# Patient Record
Sex: Female | Born: 1965 | Race: Black or African American | Hispanic: No | Marital: Single | Smoking: Never smoker
Health system: Southern US, Community
[De-identification: ages and names within clinical notes are randomized; demographics above are authoritative.]

## PROBLEM LIST (undated history)

## (undated) DIAGNOSIS — Q02 Microcephaly: Secondary | ICD-10-CM

## (undated) DIAGNOSIS — F79 Unspecified intellectual disabilities: Secondary | ICD-10-CM

## (undated) DIAGNOSIS — I10 Essential (primary) hypertension: Secondary | ICD-10-CM

## (undated) DIAGNOSIS — F419 Anxiety disorder, unspecified: Secondary | ICD-10-CM

## (undated) DIAGNOSIS — IMO0002 Reserved for concepts with insufficient information to code with codable children: Secondary | ICD-10-CM

## (undated) DIAGNOSIS — R569 Unspecified convulsions: Secondary | ICD-10-CM

## (undated) DIAGNOSIS — Z8659 Personal history of other mental and behavioral disorders: Secondary | ICD-10-CM

## (undated) HISTORY — DX: Essential (primary) hypertension: I10

## (undated) HISTORY — DX: Unspecified intellectual disabilities: F79

## (undated) HISTORY — DX: Reserved for concepts with insufficient information to code with codable children: IMO0002

---

## 1999-01-05 ENCOUNTER — Other Ambulatory Visit: Admission: RE | Admit: 1999-01-05 | Discharge: 1999-01-05 | Payer: Self-pay | Admitting: Obstetrics

## 1999-01-06 ENCOUNTER — Other Ambulatory Visit: Admission: RE | Admit: 1999-01-06 | Discharge: 1999-01-06 | Payer: Self-pay | Admitting: Obstetrics

## 1999-01-06 ENCOUNTER — Encounter (INDEPENDENT_AMBULATORY_CARE_PROVIDER_SITE_OTHER): Payer: Self-pay

## 2000-05-19 ENCOUNTER — Emergency Department (HOSPITAL_COMMUNITY): Admission: EM | Admit: 2000-05-19 | Discharge: 2000-05-19 | Payer: Self-pay | Admitting: Emergency Medicine

## 2005-12-11 ENCOUNTER — Ambulatory Visit: Payer: Self-pay | Admitting: Obstetrics & Gynecology

## 2005-12-11 ENCOUNTER — Other Ambulatory Visit: Admission: RE | Admit: 2005-12-11 | Discharge: 2005-12-11 | Payer: Self-pay | Admitting: Obstetrics & Gynecology

## 2005-12-11 ENCOUNTER — Encounter (INDEPENDENT_AMBULATORY_CARE_PROVIDER_SITE_OTHER): Payer: Self-pay | Admitting: Specialist

## 2006-01-02 ENCOUNTER — Encounter: Payer: Self-pay | Admitting: Obstetrics and Gynecology

## 2006-01-02 ENCOUNTER — Ambulatory Visit: Payer: Self-pay | Admitting: Obstetrics and Gynecology

## 2006-01-10 ENCOUNTER — Ambulatory Visit (HOSPITAL_COMMUNITY): Admission: RE | Admit: 2006-01-10 | Discharge: 2006-01-10 | Payer: Self-pay | Admitting: Obstetrics & Gynecology

## 2006-08-29 ENCOUNTER — Encounter: Payer: Self-pay | Admitting: Obstetrics and Gynecology

## 2006-08-29 ENCOUNTER — Other Ambulatory Visit: Admission: RE | Admit: 2006-08-29 | Discharge: 2006-08-29 | Payer: Self-pay | Admitting: Obstetrics & Gynecology

## 2006-08-29 ENCOUNTER — Ambulatory Visit: Payer: Self-pay | Admitting: Obstetrics and Gynecology

## 2006-08-29 ENCOUNTER — Encounter (INDEPENDENT_AMBULATORY_CARE_PROVIDER_SITE_OTHER): Payer: Self-pay | Admitting: *Deleted

## 2007-01-27 ENCOUNTER — Ambulatory Visit (HOSPITAL_COMMUNITY): Admission: RE | Admit: 2007-01-27 | Discharge: 2007-01-27 | Payer: Self-pay | Admitting: Obstetrics & Gynecology

## 2007-04-23 HISTORY — PX: LEEP: SHX91

## 2007-04-23 HISTORY — PX: CERVICAL CONE BIOPSY: SUR198

## 2007-05-21 ENCOUNTER — Ambulatory Visit: Payer: Self-pay | Admitting: Obstetrics and Gynecology

## 2007-06-09 ENCOUNTER — Ambulatory Visit (HOSPITAL_COMMUNITY): Admission: RE | Admit: 2007-06-09 | Discharge: 2007-06-09 | Payer: Self-pay | Admitting: Obstetrics and Gynecology

## 2007-06-09 ENCOUNTER — Ambulatory Visit: Payer: Self-pay | Admitting: Obstetrics and Gynecology

## 2007-06-09 ENCOUNTER — Encounter: Payer: Self-pay | Admitting: Obstetrics and Gynecology

## 2007-07-01 ENCOUNTER — Ambulatory Visit: Payer: Self-pay | Admitting: Obstetrics and Gynecology

## 2008-03-03 ENCOUNTER — Ambulatory Visit (HOSPITAL_COMMUNITY): Admission: RE | Admit: 2008-03-03 | Discharge: 2008-03-03 | Payer: Self-pay | Admitting: Obstetrics & Gynecology

## 2008-11-11 ENCOUNTER — Encounter (INDEPENDENT_AMBULATORY_CARE_PROVIDER_SITE_OTHER): Payer: Self-pay | Admitting: Obstetrics & Gynecology

## 2008-11-11 ENCOUNTER — Ambulatory Visit: Payer: Self-pay | Admitting: Obstetrics & Gynecology

## 2009-04-25 ENCOUNTER — Encounter: Admission: RE | Admit: 2009-04-25 | Discharge: 2009-04-25 | Payer: Self-pay | Admitting: Obstetrics and Gynecology

## 2010-07-02 ENCOUNTER — Other Ambulatory Visit: Payer: Self-pay | Admitting: Internal Medicine

## 2010-07-02 DIAGNOSIS — Z1231 Encounter for screening mammogram for malignant neoplasm of breast: Secondary | ICD-10-CM

## 2010-07-06 ENCOUNTER — Ambulatory Visit
Admission: RE | Admit: 2010-07-06 | Discharge: 2010-07-06 | Disposition: A | Discharge status: Home / Self Care | Payer: Medicaid Other | Source: Ambulatory Visit | Attending: Internal Medicine | Admitting: Internal Medicine

## 2010-07-06 DIAGNOSIS — Z1231 Encounter for screening mammogram for malignant neoplasm of breast: Secondary | ICD-10-CM

## 2010-07-29 LAB — POCT PREGNANCY, URINE: Preg Test, Ur: NEGATIVE

## 2010-09-04 NOTE — Group Therapy Note (Signed)
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  The patient was in a dorsolithotomy  position, the Graves speculum placed in the vagina, the cervix difficult  to get in position because of the acute retroversion of the cervix.  The  cervical os was stenotic and could not be entered with a curette;  however a Pap smear brush I put in there for an endocervical ECC.  The  3% acetic acid was used on the exocervix which showed no signs of any  acetowhite changes or punctations, mosaicism or atypical vessels.  Because I could not see any lesion or could not see inside of the  cervical os I have to call this an unsatisfactory colposcopy pending the  results of the ECC.  It is difficult for the guardian to bring in the  patient so we will call with results.           ______________________________  Argentina Donovan, MD     PR/MEDQ  D:  08/29/2006  T:  08/29/2006  Job:  [id number]

## 2010-09-04 NOTE — Op Note (Signed)
NAMEJORDEN, Brandy Galloway               ACCOUNT NO.:  [id number]   MEDICAL RECORD NO.:  [id number]          PATIENT TYPE:  AMB   LOCATION:  [facility]                           [facility]   PHYSICIAN:  Phil D. Okey Dupre, M.D.     DATE OF BIRTH:  March 12, 1966   DATE OF PROCEDURE:  06/09/2007  DATE OF DISCHARGE:                               OPERATIVE REPORT   PROCEDURE:  LEEP conization of the cervix.   PREOPERATIVE DIAGNOSIS:  Cervical dysplasia.   POSTOPERATIVE DIAGNOSIS:  Pending pathology report.   SURGEON:  Javier Glazier. Okey Dupre, M.D.   ANESTHESIA:  General.   ESTIMATED BLOOD LOSS:  Minimal.   POSTOPERATIVE CONDITION:  Satisfactory.   SPECIMENS TO PATHOLOGY:  Cervical LEEP conization.   DESCRIPTION OF PROCEDURE:  Under satisfactory general anesthesia, the  patient in dorsal lithotomy position, the perineum and vagina were  prepped and draped in the usual sterile manner.  Bimanual pelvic  examination under anesthesia revealed uterus of normal size, shape,  consistency, anterior flexed, freely movable, normal free adnexa.  A  insulated Graves speculum placed into the vagina with the cervix in the  midportion easily visualized lateral.  Insulated retractors were placed  in the sidewalls of the vagina to protect against burn and shock.  Then  1 mL of 1% Xylocaine with 1:100,000 epinephrine was injected in four  separate areas at 2, 10, 4 and 8 o'clock on the anterior surface of the  cervix and then using a 1.5 x 1.5 electrical loop at 50 cutting, 50 coag  with a 50 cutting with a one blend, the cone was taken at the basis and  was then sent for pathological diagnosis.  The base was coagulated with  a ball electrode for hemostasis and also around the entire circumference  of the biopsy site, outside the biopsy site in case any abnormal tissue  was missed.  There was virtually no bleeding during the procedure.  The  speculums were removed and the patient transferred to recovery room in  satisfactory  condition.      Phil D. Okey Dupre, M.D.  Electronically Signed     PDR/MEDQ  D:  06/09/2007  T:  06/10/2007  Job:  [id number]

## 2010-09-04 NOTE — Group Therapy Note (Signed)
NAME:  Brandy Galloway, Brandy Galloway NO.:  [id number]   MEDICAL RECORD NO.:  [id number]          PATIENT TYPE:  WOC   LOCATION:  [facility]                   [facility]ACILITY[facility]  [facility]   PHYSICIAN:  Argentina Donovan, MD        DATE OF BIRTH:  07-31-1965   DATE OF SERVICE:  05/21/2007                                  CLINIC NOTE   The patient is a 45 year old mentally challenged, African American,  nulligravida female, who has had several colposcopies in for atypical  cells and cervical dysplasia who was referred by the Health Department  needing a LEEP biopsy which we are going to carry out in the operating  room because of the patient's mental capacities. I think it will be  easier on her and on [place] to do it that way. She is in good health.   She takes some hydrochlorothiazide for hypertension.   She has no medical allergies.   No serious medical illnesses.   IMPRESSION:  Severe cervical dysplasia for LEEP biopsy.           ______________________________  Argentina Donovan, MD     PR/MEDQ  D:  05/21/2007  T:  05/21/2007  Job:  [id number]

## 2010-09-07 NOTE — Group Therapy Note (Signed)
Brandy Galloway, Brandy Galloway NO.:  [id number]   MEDICAL RECORD NO.:  [id number]          PATIENT TYPE:  WOC   LOCATION[facility]  [facility]                   [facility]ILITY[facility]   PHYSICIAN:  Elsie Lincoln, MD      DATE OF BIRTH:  04-21-1966   DATE OF SERVICE:                                    CLINIC NOTE   DATE OF SERVICE:  December 11, 2005.   REASON FOR VISIT:  The patient is a 45 year old, nulliparous female sent in  consultation by the health department  The patient is a nulliparous female  who is on Depo-Provera.  She is not having menstrual periods.  On her yearly  exam, she was found to have a intracervical polyp extruding from her cervix.  She was sent here for consultation for that.  She does have questionable  history of abnormal Pap smear, but this is not in her records, so I will  need the health department, once they read this dictation, to please send  copies of her Pap smears to the [facility] [place].  She also had  a diagnosis of cervicitis and Zithromax was given.  They did gonorrhea and  Chlamydia cultures and again, I do not have copies of those.  It is unknown  whether she is sexually active or not.  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   PHYSICAL EXAMINATION:  GENITALIA:  10/5.  Vagina slightly atrophic.  Cervix  closed with 1 cm polyp extruding.  This was twisted off with minimal  bleeding.  The patient tolerated the procedure well even given her mental  retardation, uterine constrictions or inability to relax.   ASSESSMENT:  A 45 year old female with endocervical polyp.   PLAN:  1. The patient needs Pap smear or I need to have Pap smears sent over from      the Health Department.  2. The patient needs a screening mammogram.  3. Return to clinic in 3-4 weeks.           ______________________________  Elsie Lincoln, MD     KL/MEDQ  D:  12/11/2005  T:  12/12/2005  Job:  [id number]

## 2011-01-11 LAB — CBC
HCT: 39.1
Hemoglobin: 13.6
MCHC: 34.9
MCV: 91.3
Platelets: 278
RBC: 4.28
RDW: 12.8
WBC: 5.5

## 2011-01-11 LAB — BASIC METABOLIC PANEL
BUN: 13
CO2: 27
Calcium: 9.8
Chloride: 97
Creatinine, Ser: 0.99
GFR calc Af Amer: >60
GFR calc non Af Amer: >60
Glucose, Bld: 109 — ABNORMAL HIGH
Potassium: 3.1 — ABNORMAL LOW
Sodium: 134 — ABNORMAL LOW

## 2011-01-11 LAB — URINALYSIS, ROUTINE W REFLEX MICROSCOPIC
Bilirubin Urine: NEGATIVE
Glucose, UA: NEGATIVE
Hgb urine dipstick: NEGATIVE
Ketones, ur: NEGATIVE
Nitrite: NEGATIVE
Protein, ur: NEGATIVE
Specific Gravity, Urine: 1.025
Urobilinogen, UA: 0.2
pH: 6

## 2011-01-11 LAB — HCG, SERUM, QUALITATIVE: Preg, Serum: NEGATIVE

## 2011-05-16 ENCOUNTER — Encounter: Payer: Medicaid Other | Admitting: Obstetrics and Gynecology

## 2011-05-16 ENCOUNTER — Encounter: Payer: Medicaid Other | Admitting: Physician Assistant

## 2011-06-21 HISTORY — PX: COLPOSCOPY: SHX161

## 2011-07-12 ENCOUNTER — Ambulatory Visit (INDEPENDENT_AMBULATORY_CARE_PROVIDER_SITE_OTHER): Payer: Medicaid Other | Admitting: Physician Assistant

## 2011-07-12 ENCOUNTER — Encounter: Payer: Self-pay | Admitting: Physician Assistant

## 2011-07-12 ENCOUNTER — Other Ambulatory Visit (HOSPITAL_COMMUNITY)
Admission: RE | Admit: 2011-07-12 | Discharge: 2011-07-12 | Disposition: A | Discharge status: Home / Self Care | Payer: Medicaid Other | Source: Ambulatory Visit | Attending: Obstetrics & Gynecology | Admitting: Obstetrics & Gynecology

## 2011-07-12 VITALS — BP 123/81 | HR 84 | Temp 97.1°F | Ht 65.5 in | Wt 101.3 lb

## 2011-07-12 DIAGNOSIS — IMO0002 Reserved for concepts with insufficient information to code with codable children: Secondary | ICD-10-CM

## 2011-07-12 DIAGNOSIS — Z01812 Encounter for preprocedural laboratory examination: Secondary | ICD-10-CM

## 2011-07-12 DIAGNOSIS — R87619 Unspecified abnormal cytological findings in specimens from cervix uteri: Secondary | ICD-10-CM | POA: Insufficient documentation

## 2011-07-12 DIAGNOSIS — R87612 Low grade squamous intraepithelial lesion on cytologic smear of cervix (LGSIL): Secondary | ICD-10-CM

## 2011-07-12 HISTORY — DX: Low grade squamous intraepithelial lesion on cytologic smear of cervix (LGSIL): R87.612

## 2011-07-12 HISTORY — DX: Reserved for concepts with insufficient information to code with codable children: IMO0002

## 2011-07-12 LAB — POCT PREGNANCY, URINE: Preg Test, Ur: NEGATIVE

## 2011-07-12 NOTE — Progress Notes (Signed)
Patient s/p LEEP in 2009. Hx recurrent abdnormals since LEEP. Last pap 02/2011: LSIL. Pt mentally challenged, consent given by legal guardian, signed copy in the chart, time out was performed.  Placed in lithotomy position. Cervix viewed with speculum and colposcope after application of acetic acid. Pt with previous LEEP/Conization in 2009. Cervix difficult to visualize. SCJ not apparent. Cervix speculum used.   Colposcopy adequate?  No Acetowhite lesions?Yes Punctation?No Mosaicism?  No Abnormal vasculature?  No Biopsies?Yes at 6 o'clock ECC?yes  COMMENTS:Pt tolerated the procedure well without sedation. If unclear pathology, would recommend LEEP in OR Patient was given post procedure instructions.  She will return in 2 weeks for results.

## 2011-07-12 NOTE — Patient Instructions (Signed)
Colposcopy Care After Colposcopy is a procedure in which a special tool is used to magnify the surface of the cervix. A tissue sample (biopsy) may also be taken. This sample will be looked at for cervical cancer or other problems. After the test:  You may have some cramping.   Lie down for a few minutes if you feel lightheaded.    You may have some bleeding which should stop in a few days.  HOME CARE  Do not have sex or use tampons for 2 to 3 days or as told.   Only take medicine as told by your doctor.   Continue to take your birth control pills as usual.  Finding out the results of your test Ask when your test results will be ready. Make sure you get your test results. GET HELP RIGHT AWAY IF:  You are bleeding a lot or are passing blood clots.   You develop a fever of 102 F (38.9 C) or higher.   You have abnormal vaginal discharge.   You have cramps that do not go away with medicine.   You feel lightheaded, dizzy, or pass out (faint).  MAKE SURE YOU:   Understand these instructions.   Will watch your condition.   Will get help right away if you are not doing well or get worse.  Document Released: 09/25/2007 Document Revised: 03/28/2011 Document Reviewed: 09/25/2007 ExitCare Patient Information 2012 [employer]. 

## 2011-07-16 ENCOUNTER — Other Ambulatory Visit: Payer: Self-pay | Admitting: Internal Medicine

## 2011-07-16 DIAGNOSIS — Z1231 Encounter for screening mammogram for malignant neoplasm of breast: Secondary | ICD-10-CM

## 2011-08-07 ENCOUNTER — Ambulatory Visit: Payer: Medicaid Other

## 2011-08-09 ENCOUNTER — Ambulatory Visit (INDEPENDENT_AMBULATORY_CARE_PROVIDER_SITE_OTHER): Payer: Medicaid Other | Admitting: Obstetrics & Gynecology

## 2011-08-09 ENCOUNTER — Encounter: Payer: Self-pay | Admitting: Obstetrics & Gynecology

## 2011-08-09 VITALS — BP 153/92 | HR 87 | Temp 97.5°F | Ht 65.0 in | Wt 101.2 lb

## 2011-08-09 DIAGNOSIS — F79 Unspecified intellectual disabilities: Secondary | ICD-10-CM

## 2011-08-09 DIAGNOSIS — R634 Abnormal weight loss: Secondary | ICD-10-CM

## 2011-08-09 DIAGNOSIS — R87612 Low grade squamous intraepithelial lesion on cytologic smear of cervix (LGSIL): Secondary | ICD-10-CM

## 2011-08-09 NOTE — Progress Notes (Signed)
  Subjective:    Patient ID: Brandy Galloway, female    DOB: 09-Apr-1966, 46 y.o.   MRN: [id number]  HPI  Brandy Galloway is a 46 MR lady who has a h/o abnormal paps. 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 Her colposcopy was inadequate. She complains of an unintentional 47 # weight loss over the last year, per her aunt/guardian.  Review of Systems    She is not currently sexually active and is on depo provera for menstrual regulation. Objective:   Physical Exam        Assessment & Plan:  Inadequate colpo with recurrent LGSIL- plan for CKC Weight loss- TSH

## 2011-08-10 LAB — TSH: TSH: 1.307 u[IU]/mL (ref 0.350–4.500)

## 2011-08-15 ENCOUNTER — Ambulatory Visit
Admission: RE | Admit: 2011-08-15 | Discharge: 2011-08-15 | Disposition: A | Discharge status: Home / Self Care | Payer: Medicaid Other | Source: Ambulatory Visit | Attending: Internal Medicine | Admitting: Internal Medicine

## 2011-08-15 DIAGNOSIS — Z1231 Encounter for screening mammogram for malignant neoplasm of breast: Secondary | ICD-10-CM

## 2011-08-16 ENCOUNTER — Encounter (HOSPITAL_COMMUNITY): Payer: Self-pay

## 2011-08-16 ENCOUNTER — Encounter (HOSPITAL_COMMUNITY): Payer: Self-pay | Admitting: Pharmacist

## 2011-08-16 ENCOUNTER — Inpatient Hospital Stay (HOSPITAL_COMMUNITY): Admission: RE | Admit: 2011-08-16 | Discharge: 2011-08-16 | Payer: Medicaid Other | Source: Ambulatory Visit

## 2011-08-16 NOTE — Patient Instructions (Signed)
   Your procedure is scheduled on: Tuesday April 30th  Enter through the Main Entrance of [facility] at: 10am Pick up the phone at the desk and dial [phone] and inform us of your arrival.  Please call this number if you have any problems the morning of surgery: [phone]  Remember: Do not eat food after midnight: Monday Do not drink clear liquids after: midnight Monday Take these medicines the morning of surgery with a SIP OF WATER:  Do not wear jewelry, make-up, or FINGER nail polish Do not wear lotions, powders, perfumes or deodorant. Do not shave 48 hours prior to surgery. Do not bring valuables to the hospital. Contacts, dentures or bridgework may not be worn into surgery.  Leave suitcase in the car. After Surgery it may be brought to your room. For patients being admitted to the hospital, checkout time is 11:00am the day of discharge.  Patients discharged on the day of surgery will not be allowed to drive home.     Remember to use your hibiclens as instructed.Please shower with 1/2 bottle the evening before your surgery and the other 1/2 bottle the morning of surgery. Neck down avoiding private area.

## 2011-08-19 ENCOUNTER — Encounter (HOSPITAL_COMMUNITY)
Admission: RE | Admit: 2011-08-19 | Discharge: 2011-08-19 | Disposition: A | Discharge status: Home / Self Care | Payer: Medicaid Other | Source: Ambulatory Visit | Attending: Obstetrics & Gynecology | Admitting: Obstetrics & Gynecology

## 2011-08-19 ENCOUNTER — Encounter (HOSPITAL_COMMUNITY): Payer: Self-pay

## 2011-08-19 LAB — BASIC METABOLIC PANEL
BUN: 13 mg/dL (ref 6–23)
CO2: 28 mEq/L (ref 19–32)
Calcium: 10.3 mg/dL (ref 8.4–10.5)
Chloride: 97 mEq/L (ref 96–112)
Creatinine, Ser: 0.94 mg/dL (ref 0.50–1.10)
GFR calc Af Amer: 83 mL/min — ABNORMAL LOW (ref >90)
GFR calc non Af Amer: 72 mL/min — ABNORMAL LOW (ref >90)
Glucose, Bld: 65 mg/dL — ABNORMAL LOW (ref 70–99)
Potassium: 3.2 mEq/L — ABNORMAL LOW (ref 3.5–5.1)
Sodium: 138 mEq/L (ref 135–145)

## 2011-08-19 LAB — CBC
HCT: 39 % (ref 36.0–46.0)
Hemoglobin: 13 g/dL (ref 12.0–15.0)
MCH: 30.7 pg (ref 26.0–34.0)
MCHC: 33.3 g/dL (ref 30.0–36.0)
MCV: 92.2 fL (ref 78.0–100.0)
Platelets: 330 10*3/uL (ref 150–400)
RBC: 4.23 MIL/uL (ref 3.87–5.11)
RDW: 13 % (ref 11.5–15.5)
WBC: 7.5 10*3/uL (ref 4.0–10.5)

## 2011-08-19 NOTE — Pre-Procedure Instructions (Signed)
EKG requested x 3 from PMD and not received. I spoke with Dr. Malen Gauze and if we don't receive EKG by DOS, he said he doesn't need for [place] to do an EKG on this pt. He is also aware of glucose result of 65.

## 2011-08-19 NOTE — Patient Instructions (Addendum)
YOUR PROCEDURE IS SCHEDULED ON:08/20/11  ENTER THROUGH THE MAIN ENTRANCE OF [facility] AT:10am  USE DESK PHONE AND DIAL [phone] TO INFORM us OF YOUR ARRIVAL  CALL [phone] IF YOU HAVE ANY QUESTIONS OR PROBLEMS PRIOR TO YOUR ARRIVAL.  REMEMBER: DO NOT EAT OR DRINK AFTER MIDNIGHT :tonight  SPECIAL INSTRUCTIONS:Shower with special soap    YOU MAY BRUSH YOUR TEETH THE MORNING OF SURGERY   TAKE THESE MEDICINES THE DAY OF SURGERY WITH SIP OF WATER:BP medicine  DO NOT WEAR JEWELRY, EYE MAKEUP, LIPSTICK OR DARK FINGERNAIL POLISH DO NOT WEAR LOTIONS  DO NOT SHAVE FOR 48 HOURS PRIOR TO SURGERY  YOU WILL NOT BE ALLOWED TO DRIVE YOURSELF HOME.  NAME OF DRIVER:Jessie

## 2011-08-20 ENCOUNTER — Encounter (HOSPITAL_COMMUNITY): Payer: Self-pay | Admitting: Anesthesiology

## 2011-08-20 ENCOUNTER — Ambulatory Visit (HOSPITAL_COMMUNITY): Payer: Medicaid Other | Admitting: Anesthesiology

## 2011-08-20 ENCOUNTER — Ambulatory Visit (HOSPITAL_COMMUNITY)
Admission: RE | Admit: 2011-08-20 | Discharge: 2011-08-20 | Disposition: A | Discharge status: Home / Self Care | Payer: Medicaid Other | Source: Ambulatory Visit | Attending: Obstetrics & Gynecology | Admitting: Obstetrics & Gynecology

## 2011-08-20 ENCOUNTER — Encounter (HOSPITAL_COMMUNITY)
Admission: RE | Disposition: A | Discharge status: Home / Self Care | Payer: Self-pay | Source: Ambulatory Visit | Attending: Obstetrics & Gynecology

## 2011-08-20 DIAGNOSIS — Z01818 Encounter for other preprocedural examination: Secondary | ICD-10-CM | POA: Insufficient documentation

## 2011-08-20 DIAGNOSIS — R87612 Low grade squamous intraepithelial lesion on cytologic smear of cervix (LGSIL): Secondary | ICD-10-CM | POA: Insufficient documentation

## 2011-08-20 DIAGNOSIS — Z01812 Encounter for preprocedural laboratory examination: Secondary | ICD-10-CM | POA: Insufficient documentation

## 2011-08-20 DIAGNOSIS — F79 Unspecified intellectual disabilities: Secondary | ICD-10-CM | POA: Insufficient documentation

## 2011-08-20 HISTORY — PX: CERVICAL CONIZATION W/BX: SHX1330

## 2011-08-20 LAB — PREGNANCY, URINE: Preg Test, Ur: NEGATIVE

## 2011-08-20 SURGERY — CONE BIOPSY, CERVIX
Anesthesia: General | Site: Vagina | Wound class: Clean Contaminated

## 2011-08-20 MED ORDER — LIDOCAINE HCL (CARDIAC) 20 MG/ML IV SOLN
INTRAVENOUS | Status: DC | PRN
  Administered 2011-08-20: 80 mg via INTRAVENOUS

## 2011-08-20 MED ORDER — LACTATED RINGERS IV SOLN
INTRAVENOUS | Status: DC
  Administered 2011-08-20 (×2): via INTRAVENOUS

## 2011-08-20 MED ORDER — IODINE STRONG (LUGOLS) 5 % PO SOLN
ORAL | Status: DC | PRN
  Administered 2011-08-20: 0.2 mL

## 2011-08-20 MED ORDER — ONDANSETRON HCL 4 MG/2ML IJ SOLN
INTRAMUSCULAR | Status: DC | PRN
  Administered 2011-08-20: 4 mg via INTRAVENOUS

## 2011-08-20 MED ORDER — IBUPROFEN 600 MG PO TABS: 600.0000 mg | ORAL_TABLET | Freq: Four times a day (QID) | ORAL | Status: AC | PRN

## 2011-08-20 MED ORDER — ONDANSETRON HCL 4 MG/2ML IJ SOLN
INTRAMUSCULAR | Status: AC
  Filled 2011-08-20: qty 2

## 2011-08-20 MED ORDER — MEPERIDINE HCL 25 MG/ML IJ SOLN: 6.2500 mg | INTRAMUSCULAR | Status: DC | PRN

## 2011-08-20 MED ORDER — METOCLOPRAMIDE HCL 5 MG/ML IJ SOLN: 10.0000 mg | Freq: Once | INTRAMUSCULAR | Status: DC | PRN

## 2011-08-20 MED ORDER — MIDAZOLAM HCL 2 MG/2ML IJ SOLN
INTRAMUSCULAR | Status: AC
  Filled 2011-08-20: qty 2

## 2011-08-20 MED ORDER — MIDAZOLAM HCL 5 MG/5ML IJ SOLN
INTRAMUSCULAR | Status: DC | PRN
  Administered 2011-08-20: 2 mg via INTRAVENOUS

## 2011-08-20 MED ORDER — BUPIVACAINE-EPINEPHRINE (PF) 0.5% -1:200000 IJ SOLN
INTRAMUSCULAR | Status: AC
  Filled 2011-08-20: qty 10

## 2011-08-20 MED ORDER — FENTANYL CITRATE 0.05 MG/ML IJ SOLN
INTRAMUSCULAR | Status: DC | PRN
  Administered 2011-08-20: 100 ug via INTRAVENOUS

## 2011-08-20 MED ORDER — LIDOCAINE HCL (CARDIAC) 20 MG/ML IV SOLN
INTRAVENOUS | Status: AC
  Filled 2011-08-20: qty 5

## 2011-08-20 MED ORDER — PROPOFOL 10 MG/ML IV EMUL
INTRAVENOUS | Status: DC | PRN
  Administered 2011-08-20: 200 mg via INTRAVENOUS

## 2011-08-20 MED ORDER — FENTANYL CITRATE 0.05 MG/ML IJ SOLN: 25.0000 ug | INTRAMUSCULAR | Status: DC | PRN

## 2011-08-20 MED ORDER — BUPIVACAINE HCL (PF) 0.5 % IJ SOLN
INTRAMUSCULAR | Status: AC
  Filled 2011-08-20: qty 30

## 2011-08-20 MED ORDER — FENTANYL CITRATE 0.05 MG/ML IJ SOLN
INTRAMUSCULAR | Status: AC
  Filled 2011-08-20: qty 5

## 2011-08-20 MED ORDER — ACETIC ACID 4% SOLUTION
Status: DC | PRN
  Administered 2011-08-20: 1 via TOPICAL

## 2011-08-20 MED ORDER — BUPIVACAINE HCL (PF) 0.5 % IJ SOLN
INTRAMUSCULAR | Status: DC | PRN
  Administered 2011-08-20: 10 mL

## 2011-08-20 MED ORDER — PROPOFOL 10 MG/ML IV EMUL
INTRAVENOUS | Status: AC
  Filled 2011-08-20: qty 20

## 2011-08-20 MED ORDER — DEXTROSE IN LACTATED RINGERS 5 % IV SOLN: INTRAVENOUS | Status: DC

## 2011-08-20 SURGICAL SUPPLY — 29 items
ATCH SMKEVC FLXB CAUT HNDSWH (FILTER) IMPLANT
BLADE SURG 11 STRL SS (BLADE) ×2 IMPLANT
CLOTH BEACON ORANGE TIMEOUT ST (SAFETY) ×2 IMPLANT
CONTAINER PREFILL 10% NBF 60ML (FORM) ×4 IMPLANT
COUNTER NEEDLE 1200 MAGNETIC (NEEDLE) ×2 IMPLANT
DILATOR CANAL MILEX (MISCELLANEOUS) IMPLANT
ELECT REM PT RETURN 9FT ADLT (ELECTROSURGICAL) ×2
ELECTRODE REM PT RTRN 9FT ADLT (ELECTROSURGICAL) ×1 IMPLANT
EVACUATOR SMOKE ACCUVAC VALLEY (FILTER) ×1
GLOVE BIO SURGEON STRL SZ 6.5 (GLOVE) ×4 IMPLANT
GLOVE BIOGEL PI IND STRL 6 (GLOVE) ×1 IMPLANT
GLOVE BIOGEL PI INDICATOR 6 (GLOVE) ×1
GOWN PREVENTION PLUS LG XLONG (DISPOSABLE) ×4 IMPLANT
NDL SPNL 18GX3.5 QUINCKE PK (NEEDLE) ×1 IMPLANT
NEEDLE SPNL 18GX3.5 QUINCKE PK (NEEDLE) ×2 IMPLANT
PACK VAGINAL MINOR WOMEN LF (CUSTOM PROCEDURE TRAY) ×2 IMPLANT
PENCIL BUTTON HOLSTER BLD 10FT (ELECTRODE) ×2 IMPLANT
SCOPETTES 8  STERILE (MISCELLANEOUS) ×1
SCOPETTES 8 STERILE (MISCELLANEOUS) ×1 IMPLANT
SPONGE SURGIFOAM ABS GEL 12-7 (HEMOSTASIS) IMPLANT
SUT CHROMIC 0 CT 1 (SUTURE) IMPLANT
SUT VIC AB 0 CT1 27 (SUTURE)
SUT VIC AB 0 CT1 27XBRD ANBCTR (SUTURE) IMPLANT
SUT VICRYL 0 UR6 27IN ABS (SUTURE) ×4 IMPLANT
SYR CONTROL 10ML LL (SYRINGE) ×2 IMPLANT
TOWEL OR 17X24 6PK STRL BLUE (TOWEL DISPOSABLE) ×4 IMPLANT
TUBING NON-CON 1/4 X 20 CONN (TUBING) ×3 IMPLANT
WATER STERILE IRR 1000ML POUR (IV SOLUTION) ×2 IMPLANT
YANKAUER SUCT BULB TIP NO VENT (SUCTIONS) ×2 IMPLANT

## 2011-08-20 NOTE — Transfer of Care (Signed)
Immediate Anesthesia Transfer of Care Note  Patient: Brandy Galloway  Procedure(s) Performed: Procedure(s) (LRB): CONIZATION CERVIX WITH BIOPSY (N/A)  Patient Location: PACU  Anesthesia Type: General  Level of Consciousness: awake and alert   Airway & Oxygen Therapy: Patient Spontanous Breathing and Patient connected to nasal cannula oxygen  Post-op Assessment: Report given to PACU RN and Post -op Vital signs reviewed and stable  Post vital signs: stable  Complications: No apparent anesthesia complications

## 2011-08-20 NOTE — H&P (Signed)
  H&P  46 yo S AA G0 with MR who had a LGSIL pap 2012. She had an inadequate colposcopy and is here today for a CKC. She missed 2 other pre op appts and her surgery has thus been delayed until now.   PMH- MR (Her aunt is her guardian and how power of attorney)           HTN  PSH- LEEP 2009   Meds- HCTZ            Colace  KNDA  SH- negative  FH- [place]  ROS- [place]   PE- [id number]  VSS, AF  Heart- rrr Lungs- CTAB\ Abd- benign  A/P. LGSIL and inadequate colposcopy. She will need a CKC. Consent signed by her guardian.

## 2011-08-20 NOTE — Op Note (Signed)
08/20/2011  11:52 AM  PATIENT:  Brandy Galloway  46 y.o. female  PRE-OPERATIVE DIAGNOSIS:  lgsil abnormal pap, inadequate colposcopy, mental retardation  POST-OPERATIVE DIAGNOSIS:  same  PROCEDURE:  Procedure(s) (LRB): CONIZATION CERVIX WITH BIOPSY (N/A)  SURGEON:  Surgeon(s) and Role:    * Allie Bossier, MD - Primary  PHYSICIAN ASSISTANT:   ASSISTANTS: none   ANESTHESIA:   general  EBL:  Total I/O In: 1000 [I.V.:1000] Out: 15 [Blood:15]  BLOOD ADMINISTERED:none  DRAINS: none   LOCAL MEDICATIONS USED:  MARCAINE     SPECIMEN:  Source of Specimen:  cone biopsy and ECC  DISPOSITION OF SPECIMEN:  PATHOLOGY  COUNTS:  YES  TOURNIQUET:  * No tourniquets in log *  DICTATION: .Dragon Dictation  PLAN OF CARE: Discharge to home after PACU  PATIENT DISPOSITION:  PACU - hemodynamically stable.   Delay start of Pharmacological VTE agent (>24hrs) due to surgical blood loss or risk of bleeding: not applicable   The risks, benefits, and alternatives of surgery were explained to her guardian. Consents were signed. All questions were answered. In the operating room, general anesthesia was applied without complication. She was placed in dorsal lithotomy position. Her vagina was prepped and draped in the usual sterile fashion. A bimanual exam revealed no adnexal masses and a small uterus. Her cervix was palpably very small, presumably from her previous LEEP procedure. A Deaver was placed anteriorly and posteriorly in order to visualize her cervix. Lugol solution was placed on her cervix. There was a nonstaining lesion at the 6:00 position. Her cervix was almost flush with the vagina. I was able to grab a small lip of the anterior lip of the cervix. I used a scalpel to remove a cone-shaped portion of tissue. I doubt as much endocervical tissue was as I possibly could, but it seemed to be a very small amount. I cauterized the cone bed with the Bovie. I closed the cervix, orbital was left of  the cervix, with a 0 Vicryl suture in a purses string fashion. Excellent hemostasis was noted. She was taken to recovery in stable condition.

## 2011-08-20 NOTE — Discharge Instructions (Signed)

## 2011-08-20 NOTE — Anesthesia Preprocedure Evaluation (Signed)
Anesthesia Evaluation  Patient identified by MRN, date of birth, ID band Patient awake    Reviewed: Allergy & Precautions, H&P , NPO status , Patient's Chart, lab work & pertinent test results  Airway Mallampati: IV TM Distance: >3 FB Neck ROM: full    Dental  (+) Edentulous Upper   Pulmonary neg pulmonary ROS,  breath sounds clear to auscultation  Pulmonary exam normal       Cardiovascular hypertension, On Medications Rhythm:regular Rate:Normal     Neuro/Psych PSYCHIATRIC DISORDERS Mental Retardationnegative neurological ROS     GI/Hepatic negative GI ROS, Neg liver ROS,   Endo/Other  negative endocrine ROS  Renal/GU negative Renal ROS  negative genitourinary   Musculoskeletal   Abdominal Normal abdominal exam  (+)   Peds  Hematology negative hematology ROS (+)   Anesthesia Other Findings   Reproductive/Obstetrics negative OB ROS                           Anesthesia Physical Anesthesia Plan  ASA: II  Anesthesia Plan: General LMA   Post-op Pain Management:    Induction:   Airway Management Planned:   Additional Equipment:   Intra-op Plan:   Post-operative Plan:   Informed Consent: I have reviewed the patients History and Physical, chart, labs and discussed the procedure including the risks, benefits and alternatives for the proposed anesthesia with the patient or authorized representative who has indicated his/her understanding and acceptance.   Dental Advisory Given  Plan Discussed with: CRNA, Anesthesiologist and Surgeon  Anesthesia Plan Comments:         Anesthesia Quick Evaluation

## 2011-08-21 ENCOUNTER — Encounter (HOSPITAL_COMMUNITY): Payer: Self-pay | Admitting: Obstetrics & Gynecology

## 2011-08-21 NOTE — Anesthesia Postprocedure Evaluation (Signed)
  Anesthesia Post-op Note  Patient: Brandy Galloway  Procedure(s) Performed: Procedure(s) (LRB): CONIZATION CERVIX WITH BIOPSY (N/A)  Patient Location: PACU  Anesthesia Type: General  Level of Consciousness: awake, alert  and oriented  Airway and Oxygen Therapy: Patient Spontanous Breathing  Post-op Pain: none  Post-op Assessment: Post-op Vital signs reviewed, Patient's Cardiovascular Status Stable, Respiratory Function Stable, Patent Airway, No signs of Nausea or vomiting and Pain level controlled  Post-op Vital Signs: Reviewed and stable  Complications: No apparent anesthesia complications

## 2011-08-26 ENCOUNTER — Encounter: Payer: Self-pay | Admitting: *Deleted

## 2012-07-20 ENCOUNTER — Other Ambulatory Visit: Payer: Self-pay

## 2012-07-20 DIAGNOSIS — Z1231 Encounter for screening mammogram for malignant neoplasm of breast: Secondary | ICD-10-CM

## 2012-08-27 ENCOUNTER — Ambulatory Visit
Admission: RE | Admit: 2012-08-27 | Discharge: 2012-08-27 | Disposition: A | Discharge status: Home / Self Care | Payer: Medicaid Other | Source: Ambulatory Visit

## 2012-08-27 ENCOUNTER — Other Ambulatory Visit: Payer: Self-pay | Admitting: Internal Medicine

## 2012-08-27 DIAGNOSIS — Z1231 Encounter for screening mammogram for malignant neoplasm of breast: Secondary | ICD-10-CM

## 2012-08-27 DIAGNOSIS — R928 Other abnormal and inconclusive findings on diagnostic imaging of breast: Secondary | ICD-10-CM

## 2012-09-10 ENCOUNTER — Ambulatory Visit
Admission: RE | Admit: 2012-09-10 | Discharge: 2012-09-10 | Disposition: A | Discharge status: Home / Self Care | Payer: Medicaid Other | Source: Ambulatory Visit | Attending: Internal Medicine | Admitting: Internal Medicine

## 2012-09-10 DIAGNOSIS — R928 Other abnormal and inconclusive findings on diagnostic imaging of breast: Secondary | ICD-10-CM

## 2013-02-15 ENCOUNTER — Other Ambulatory Visit: Payer: Self-pay | Admitting: Internal Medicine

## 2013-02-15 DIAGNOSIS — R921 Mammographic calcification found on diagnostic imaging of breast: Secondary | ICD-10-CM

## 2013-03-09 ENCOUNTER — Ambulatory Visit
Admission: RE | Admit: 2013-03-09 | Discharge: 2013-03-09 | Disposition: A | Discharge status: Home / Self Care | Payer: Medicaid Other | Source: Ambulatory Visit | Attending: Internal Medicine | Admitting: Internal Medicine

## 2013-03-09 DIAGNOSIS — R921 Mammographic calcification found on diagnostic imaging of breast: Secondary | ICD-10-CM

## 2013-07-22 ENCOUNTER — Encounter: Payer: Self-pay | Admitting: Obstetrics

## 2013-08-09 ENCOUNTER — Other Ambulatory Visit: Payer: Self-pay | Admitting: Internal Medicine

## 2013-08-09 DIAGNOSIS — R921 Mammographic calcification found on diagnostic imaging of breast: Secondary | ICD-10-CM

## 2013-08-11 ENCOUNTER — Ambulatory Visit: Payer: Medicaid Other | Admitting: Obstetrics

## 2013-08-25 ENCOUNTER — Ambulatory Visit: Payer: Medicaid Other | Admitting: Obstetrics

## 2013-08-30 ENCOUNTER — Ambulatory Visit
Admission: RE | Admit: 2013-08-30 | Discharge: 2013-08-30 | Disposition: A | Discharge status: Home / Self Care | Payer: Medicaid Other | Source: Ambulatory Visit | Attending: Internal Medicine | Admitting: Internal Medicine

## 2013-08-30 ENCOUNTER — Encounter (INDEPENDENT_AMBULATORY_CARE_PROVIDER_SITE_OTHER): Payer: Self-pay

## 2013-08-30 DIAGNOSIS — R921 Mammographic calcification found on diagnostic imaging of breast: Secondary | ICD-10-CM

## 2016-01-16 ENCOUNTER — Other Ambulatory Visit: Payer: Self-pay | Admitting: Internal Medicine

## 2016-01-16 DIAGNOSIS — R921 Mammographic calcification found on diagnostic imaging of breast: Secondary | ICD-10-CM

## 2016-01-24 ENCOUNTER — Ambulatory Visit
Admission: RE | Admit: 2016-01-24 | Discharge: 2016-01-24 | Disposition: A | Discharge status: Home / Self Care | Payer: Medicaid Other | Source: Ambulatory Visit | Attending: Internal Medicine | Admitting: Internal Medicine

## 2016-01-24 DIAGNOSIS — R921 Mammographic calcification found on diagnostic imaging of breast: Secondary | ICD-10-CM

## 2016-02-29 ENCOUNTER — Other Ambulatory Visit: Payer: Self-pay | Admitting: Internal Medicine

## 2016-02-29 DIAGNOSIS — E2839 Other primary ovarian failure: Secondary | ICD-10-CM

## 2016-04-04 ENCOUNTER — Other Ambulatory Visit: Payer: Self-pay | Admitting: Obstetrics and Gynecology

## 2016-04-04 ENCOUNTER — Encounter: Payer: Self-pay | Admitting: Obstetrics and Gynecology

## 2016-04-04 ENCOUNTER — Encounter: Payer: Self-pay | Admitting: *Deleted

## 2016-04-04 ENCOUNTER — Ambulatory Visit (INDEPENDENT_AMBULATORY_CARE_PROVIDER_SITE_OTHER): Payer: Medicaid Other | Admitting: Obstetrics and Gynecology

## 2016-04-04 VITALS — BP 173/73 | HR 81 | Ht 65.0 in | Wt 127.6 lb

## 2016-04-04 DIAGNOSIS — N950 Postmenopausal bleeding: Secondary | ICD-10-CM

## 2016-04-04 DIAGNOSIS — R87612 Low grade squamous intraepithelial lesion on cytologic smear of cervix (LGSIL): Secondary | ICD-10-CM

## 2016-04-04 MED ORDER — MEDROXYPROGESTERONE ACETATE 10 MG PO TABS: 10.0000 mg | ORAL_TABLET | Freq: Every day | ORAL | 12 refills | Status: DC

## 2016-04-04 NOTE — Patient Instructions (Signed)
Postmenopausal Bleeding Postmenopausal bleeding is any bleeding after menopause. Menopause is when a woman's period stops. Any type of bleeding after menopause is concerning. It should be checked by your doctor. Any treatment will depend on the cause. Follow these instructions at home: Watch your condition for any changes.  Avoid the use of tampons and douches as told by your doctor.  Change your pads often.  Get regular pelvic exams and Pap tests.  Keep all appointments for tests as told by your doctor.  Contact a doctor if:  Your bleeding lasts for more than 1 week.  You have belly (abdominal) pain.  You have bleeding after sex (intercourse). Get help right away if:  You have a fever, chills, a headache, dizziness, muscle aches, and bleeding.  You have strong pain with bleeding.  You have clumps of blood (blood clots) coming from your vagina.  You have bleeding and need more than 1 pad an hour.  You feel like you are going to pass out (faint). This information is not intended to replace advice given to you by your health care provider. Make sure you discuss any questions you have with your health care provider. Document Released: 01/16/2008 Document Revised: 09/14/2015 Document Reviewed: 11/05/2012 Elsevier Interactive Patient Education  2017 Elsevier Inc.  

## 2016-04-04 NOTE — Progress Notes (Signed)
50 yo G0 referred from [place] for management of abnormal pap smear. Patient has mental retardation and is accompanied by her uncle who is her care giver. He reports new onset of vaginal bleeding over the past 2 months lasting for 2-4 days. He states that she has not had a period for a long time, over a year and now it has returned. Patient does not voice any other complaints. He is unaware of other issues. He desires for her to be restarted on depo-provera to stop her vaginal bleeding  Past Medical History:  Diagnosis Date  . Hypertension   . 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 Mental retardation    Past Surgical History:  Procedure Laterality Date  . CERVICAL CONE BIOPSY  2009  . CERVICAL CONIZATION W/BX  08/20/2011   Procedure: CONIZATION CERVIX WITH BIOPSY;  Surgeon: Emily Filbert, MD;  Location: [facility];  Service: Gynecology;  Laterality: N/A;  With Endocervical [place]  . COLPOSCOPY  06/2011  . LEEP  2009   Family History  Problem Relation Age of Onset  . Diabetes Father   . Diabetes Mother    Social History   Social History  . Marital status: Single    Spouse name: N/A  . Number of children: N/A  . Years of education: N/A   Occupational History  . Not on file.   Social History Main Topics  . Smoking status: Never Smoker  . Smokeless tobacco: Never Used  . Alcohol use No  . Drug use: No  . Sexual activity: No   Other Topics Concern  . Not on file   Social History Narrative  . No narrative on file   ROS See pertinent in HPI  Blood pressure (!) 173/73, pulse 81, height 5\' 5"  (1.651 m), weight 127 lb 9.6 oz (57.9 kg). GENERAL: Well-developed, well-nourished female in no acute distress.  ABDOMEN: Soft, nontender, nondistended. No organomegaly. PELVIC: Normal external female genitalia. Vagina is pink and rugated.  Small amount of blood in vault. Cervix was not clearly visualized due to patient discomfort. Uterus is normal  in size. No adnexal mass or tenderness. EXTREMITIES: No cyanosis, clubbing, or edema, 2+ distal pulses.  A/P 50 yo with LGSIL on 12/2015 pap smear and PMB - Discussed performing a colposcopy and an endometrial biopsy today. He thinks that she will be able to tolerate both procedures as she has done well in the past - Patient given informed consent, signed copy in the chart, time out was performed.  Placed in lithotomy position.  Attempt at colposcopy was discontinued due to patient discomfort and she started to cry. Discussed with her uncle that both procedures will be done in the operating room - Rx provera provided to control her vaginal bleeding - Pelvic ultrasound also ordered - Patient will be scheduled for colposcopy and endometrial biopsy   Gabrille Kilbride, MD

## 2016-04-11 ENCOUNTER — Ambulatory Visit (HOSPITAL_COMMUNITY)
Admission: RE | Admit: 2016-04-11 | Discharge: 2016-04-11 | Disposition: A | Discharge status: Home / Self Care | Payer: Medicaid Other | Source: Ambulatory Visit | Attending: Obstetrics and Gynecology | Admitting: Obstetrics and Gynecology

## 2016-04-11 DIAGNOSIS — D250 Submucous leiomyoma of uterus: Secondary | ICD-10-CM | POA: Insufficient documentation

## 2016-04-11 DIAGNOSIS — N83202 Unspecified ovarian cyst, left side: Secondary | ICD-10-CM | POA: Diagnosis not present

## 2016-04-11 DIAGNOSIS — N950 Postmenopausal bleeding: Secondary | ICD-10-CM | POA: Diagnosis not present

## 2016-05-24 NOTE — Patient Instructions (Signed)
Your procedure is scheduled on:  Tomorrow, Feb. 6, 2018  Enter through the [employer] of [facility] at:  10:00 AM  Pick up the phone at the desk and dial [phone].  Call this number if you have problems the morning of surgery: [phone].  Remember: Do NOT eat food or drink after:  Midnight Tonight  Take these medicines the morning of surgery with a SIP OF WATER:  Hydrochlorothiazide, Colace  Stop ALL herbal medications at this time  Do NOT smoke the day of surgery.  Do NOT wear jewelry (body piercing), metal hair clips/bobby pins, make-up, or nail polish. Do NOT wear lotions, powders, or perfumes.  You may wear deodorant. Do NOT shave for 48 hours prior to surgery. Do NOT bring valuables to the hospital. Contacts, dentures, or bridgework may not be worn into surgery.  Have a responsible adult drive you home and stay with you for 24 hours after your procedure  Bring a copy of your healthcare power of attorney and living will documents.  **Effective Friday, Jan. 12, 2018, [place] will implement no hospital visitations from children age 66 and younger due to a steady increase in flu activity in our community and hospitals. **

## 2016-05-27 ENCOUNTER — Encounter (HOSPITAL_COMMUNITY): Payer: Self-pay

## 2016-05-27 ENCOUNTER — Other Ambulatory Visit: Payer: Self-pay

## 2016-05-27 ENCOUNTER — Encounter (HOSPITAL_COMMUNITY)
Admission: RE | Admit: 2016-05-27 | Discharge: 2016-05-27 | Disposition: A | Discharge status: Home / Self Care | Payer: Medicaid Other | Source: Ambulatory Visit | Attending: Obstetrics and Gynecology | Admitting: Obstetrics and Gynecology

## 2016-05-27 DIAGNOSIS — R87612 Low grade squamous intraepithelial lesion on cytologic smear of cervix (LGSIL): Secondary | ICD-10-CM | POA: Diagnosis not present

## 2016-05-27 DIAGNOSIS — Z01810 Encounter for preprocedural cardiovascular examination: Secondary | ICD-10-CM | POA: Diagnosis not present

## 2016-05-27 DIAGNOSIS — N950 Postmenopausal bleeding: Secondary | ICD-10-CM | POA: Diagnosis not present

## 2016-05-27 DIAGNOSIS — Z01812 Encounter for preprocedural laboratory examination: Secondary | ICD-10-CM | POA: Diagnosis present

## 2016-05-27 HISTORY — DX: Unspecified convulsions: R56.9

## 2016-05-27 HISTORY — DX: Anxiety disorder, unspecified: F41.9

## 2016-05-27 LAB — BASIC METABOLIC PANEL
Anion gap: 7 (ref 5–15)
BUN: 17 mg/dL (ref 6–20)
CO2: 25 mmol/L (ref 22–32)
Calcium: 9.4 mg/dL (ref 8.9–10.3)
Chloride: 104 mmol/L (ref 101–111)
Creatinine, Ser: 0.74 mg/dL (ref 0.44–1.00)
GFR calc Af Amer: >60 mL/min (ref >60)
GFR calc non Af Amer: >60 mL/min (ref >60)
Glucose, Bld: 99 mg/dL (ref 65–99)
Potassium: 3.7 mmol/L (ref 3.5–5.1)
Sodium: 136 mmol/L (ref 135–145)

## 2016-05-27 LAB — CBC
HCT: 34.6 % — ABNORMAL LOW (ref 36.0–46.0)
Hemoglobin: 11.8 g/dL — ABNORMAL LOW (ref 12.0–15.0)
MCH: 30.9 pg (ref 26.0–34.0)
MCHC: 34.1 g/dL (ref 30.0–36.0)
MCV: 90.6 fL (ref 78.0–100.0)
Platelets: 257 10*3/uL (ref 150–400)
RBC: 3.82 MIL/uL — ABNORMAL LOW (ref 3.87–5.11)
RDW: 13.4 % (ref 11.5–15.5)
WBC: 5.1 10*3/uL (ref 4.0–10.5)

## 2016-05-27 NOTE — H&P (Signed)
Brandy Galloway is an 51 y.o. female G0 with mental retardation here for exam under anesthesia. Patient with history of abnormal pap smear in 12/2015 (LGSIL) and is in need of colposcopy. Patient also has been experiencing postmenopausal vaginal bleeding and is need of an endometrial biopsy. Patient and her caregiver are otherwise without complaints. He reports some vaginal bleeding a week ago which resolved with Megace  Pertinent Gynecological History: Menses: post-menopausal Bleeding: post menopausal bleeding Contraception: none DES exposure: denies Blood transfusions: none Sexually transmitted diseases: no past history Previous GYN Procedures: n/a  Last mammogram: normal Date: 01/2016 Last pap: abnormal: LGSIL Date: 12/2015 OB History: G0, P0   Menstrual History: No LMP recorded. Patient is postmenopausal.    Past Medical History:  Diagnosis Date  . Anxiety   . Hypertension   . 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 Mental retardation   . Seizures ([place])    none in 20 years or more    Past Surgical History:  Procedure Laterality Date  . CERVICAL CONE BIOPSY  2009  . CERVICAL CONIZATION W/BX  08/20/2011   Procedure: CONIZATION CERVIX WITH BIOPSY;  Surgeon: Emily Filbert, MD;  Location: [facility];  Service: Gynecology;  Laterality: N/A;  With Endocervical Currettage  . COLPOSCOPY  06/2011  . LEEP  2009    Family History  Problem Relation Age of Onset  . Diabetes Father   . Diabetes Mother     Social History:  reports that she has never smoked. She has never used smokeless tobacco. She reports that she does not drink alcohol or use drugs.  Allergies: No Known Allergies  Prescriptions Prior to Admission  Medication Sig Dispense Refill Last Dose  . docusate sodium (COLACE) 100 MG capsule Take 100 mg by mouth daily.   Past Week at Unknown time  . hydrochlorothiazide (HYDRODIURIL) 12.5 MG tablet Take 12.5 mg by mouth daily.  2 05/27/2016 at  Unknown time  . ibuprofen (ADVIL,MOTRIN) 400 MG tablet Take 400 mg by mouth every 6 (six) hours as needed for mild pain.     . medroxyPROGESTERone (PROVERA) 10 MG tablet Take 1 tablet (10 mg total) by mouth daily. 30 tablet 12 05/27/2016 at Unknown time  . metroNIDAZOLE (FLAGYL) 500 MG tablet Take 500 mg by mouth 2 (two) times daily.  0 05/27/2016 at Unknown time  . polyethylene glycol (MIRALAX / GLYCOLAX) packet Take 17 g by mouth daily as needed for mild constipation.   Past Week at Unknown time  . triamcinolone cream (KENALOG) 0.5 % Apply 1 application topically 2 (two) times daily as needed (rash).   Past Week at Unknown time    ROS See pertinent in HPI Blood pressure (!) 159/92, pulse 74, temperature 97.9 F (36.6 C), temperature source Oral, resp. rate 16, SpO2 100 %. Physical Exam GENERAL: Well-developed, well-nourished female in no acute distress.  LUNGS: Clear to auscultation bilaterally.  HEART: Regular rate and rhythm. ABDOMEN: Soft, nontender, nondistended. No organomegaly. PELVIC: Deferred to OR EXTREMITIES: No cyanosis, clubbing, or edema, 2+ distal pulses.  Results for orders placed or performed during the hospital encounter of 05/28/16 (from the past 24 hour(s))  Pregnancy, urine     Status: None   Collection Time: 05/28/16 10:05 AM  Result Value Ref Range   Preg Test, Ur NEGATIVE NEGATIVE    No results found. Pelvic ultrasound FINDINGS: Uterus  Measurements: 8.4 x 3.9 x 4.8 cm. A submucosal fibroid is seen in the right posterior  fundus which measures 1.6 cm in maximum diameter. No other fibroids identified.  Endometrium  Thickness: 6 mm.  No focal abnormality visualized.  Right ovary  Measurements: 2.2 x 1.4 x 0.9 cm. Normal appearance/no adnexal mass.  Left ovary  Measurements: 3.7 x 1.3 x 1.1 cm. A 1.8 cm benign-appearing cyst with a single thin internal septation is seen  Other findings  No abnormal free fluid.  IMPRESSION: 1.6 cm  submucosal fibroid in right posterior fundus.  Endometrial thickness measures 6 mm. In the setting of post-menopausal bleeding, endometrial sampling is indicated to exclude carcinoma. If results are benign, sonohysterogram should be considered for focal lesion work-up. (Ref: Radiological Reasoning: Algorithmic Workup of Abnormal Vaginal Bleeding with Endovaginal Sonography and Sonohysterography. AJR [id number]).  1.8 cm benign-appearing left ovarian cyst. Yearly followup by ultrasound is recommended in a postmenopausal female . This recommendation follows the consensus statement: Management of Asymptomatic Ovarian and Other Adnexal Cysts Imaged at [place]: Society of Radiologists in [place]. Radiology 2010; [phone].   Electronically Signed   By: Earle Gell M.D.   On: 04/11/2016 11:37  Assessment/Plan: 51 yo here for exam under anesthesia for colposcopy and D&C due to abnormal pap smear and PMB - Risks, benefits and alternatives were explained including but not limited to risks of bleeding, infection and damage to adjacent organs. Patient and caregiver verbalized understanding. All questions were answered - Refill on Megace provided until results of biopsy  Annessa Satre 05/28/2016, 10:48 AM

## 2016-05-28 ENCOUNTER — Encounter (HOSPITAL_COMMUNITY)
Admission: RE | Disposition: A | Discharge status: Home / Self Care | Payer: Self-pay | Source: Ambulatory Visit | Attending: Obstetrics and Gynecology

## 2016-05-28 ENCOUNTER — Encounter (HOSPITAL_COMMUNITY): Payer: Self-pay | Admitting: *Deleted

## 2016-05-28 ENCOUNTER — Ambulatory Visit (HOSPITAL_COMMUNITY): Payer: Medicaid Other | Admitting: Anesthesiology

## 2016-05-28 ENCOUNTER — Ambulatory Visit (HOSPITAL_COMMUNITY)
Admission: RE | Admit: 2016-05-28 | Discharge: 2016-05-28 | Disposition: A | Discharge status: Home / Self Care | Payer: Medicaid Other | Source: Ambulatory Visit | Attending: Obstetrics and Gynecology | Admitting: Obstetrics and Gynecology

## 2016-05-28 DIAGNOSIS — F419 Anxiety disorder, unspecified: Secondary | ICD-10-CM | POA: Diagnosis not present

## 2016-05-28 DIAGNOSIS — Z79899 Other long term (current) drug therapy: Secondary | ICD-10-CM | POA: Diagnosis not present

## 2016-05-28 DIAGNOSIS — I10 Essential (primary) hypertension: Secondary | ICD-10-CM | POA: Insufficient documentation

## 2016-05-28 DIAGNOSIS — N950 Postmenopausal bleeding: Secondary | ICD-10-CM

## 2016-05-28 DIAGNOSIS — R87612 Low grade squamous intraepithelial lesion on cytologic smear of cervix (LGSIL): Secondary | ICD-10-CM | POA: Diagnosis not present

## 2016-05-28 DIAGNOSIS — F79 Unspecified intellectual disabilities: Secondary | ICD-10-CM | POA: Insufficient documentation

## 2016-05-28 HISTORY — PX: COLPOSCOPY: SHX161

## 2016-05-28 HISTORY — PX: DILATION AND CURETTAGE OF UTERUS: SHX78

## 2016-05-28 LAB — PREGNANCY, URINE: Preg Test, Ur: NEGATIVE

## 2016-05-28 SURGERY — DILATION AND CURETTAGE
Anesthesia: General | Site: Vagina

## 2016-05-28 MED ORDER — ONDANSETRON HCL 4 MG/2ML IJ SOLN
INTRAMUSCULAR | Status: DC | PRN
  Administered 2016-05-28: 4 mg via INTRAVENOUS

## 2016-05-28 MED ORDER — ACETAMINOPHEN 325 MG PO TABS: 325.0000 mg | ORAL_TABLET | ORAL | Status: DC | PRN

## 2016-05-28 MED ORDER — PROPOFOL 10 MG/ML IV BOLUS
INTRAVENOUS | Status: AC
  Filled 2016-05-28: qty 20

## 2016-05-28 MED ORDER — IODINE STRONG (LUGOLS) 5 % PO SOLN
ORAL | Status: AC
  Filled 2016-05-28: qty 1

## 2016-05-28 MED ORDER — LIDOCAINE HCL (CARDIAC) 20 MG/ML IV SOLN
INTRAVENOUS | Status: AC
  Filled 2016-05-28: qty 5

## 2016-05-28 MED ORDER — LIDOCAINE HCL 1 % IJ SOLN
INTRAMUSCULAR | Status: AC
  Filled 2016-05-28: qty 20

## 2016-05-28 MED ORDER — ACETIC ACID 4% SOLUTION
Status: DC | PRN
  Administered 2016-05-28: 1 via TOPICAL

## 2016-05-28 MED ORDER — LACTATED RINGERS IV SOLN
INTRAVENOUS | Status: DC
  Administered 2016-05-28: 1000 mL via INTRAVENOUS

## 2016-05-28 MED ORDER — MEPERIDINE HCL 25 MG/ML IJ SOLN: 6.2500 mg | INTRAMUSCULAR | Status: DC | PRN

## 2016-05-28 MED ORDER — ONDANSETRON HCL 4 MG/2ML IJ SOLN: 4.0000 mg | Freq: Once | INTRAMUSCULAR | Status: DC | PRN

## 2016-05-28 MED ORDER — SCOPOLAMINE 1 MG/3DAYS TD PT72
MEDICATED_PATCH | TRANSDERMAL | Status: AC
  Filled 2016-05-28: qty 1

## 2016-05-28 MED ORDER — CHLOROPROCAINE HCL 1 % IJ SOLN
INTRAMUSCULAR | Status: AC
  Filled 2016-05-28: qty 30

## 2016-05-28 MED ORDER — FERRIC SUBSULFATE 259 MG/GM EX SOLN
CUTANEOUS | Status: AC
  Filled 2016-05-28: qty 8

## 2016-05-28 MED ORDER — FENTANYL CITRATE (PF) 100 MCG/2ML IJ SOLN
INTRAMUSCULAR | Status: AC
  Filled 2016-05-28: qty 2

## 2016-05-28 MED ORDER — PROPOFOL 10 MG/ML IV BOLUS
INTRAVENOUS | Status: DC | PRN
  Administered 2016-05-28: 100 mg via INTRAVENOUS
  Administered 2016-05-28: 50 mg via INTRAVENOUS

## 2016-05-28 MED ORDER — FENTANYL CITRATE (PF) 100 MCG/2ML IJ SOLN
25.0000 ug | INTRAMUSCULAR | Status: DC | PRN
  Administered 2016-05-28 (×2): 25 ug via INTRAVENOUS

## 2016-05-28 MED ORDER — SCOPOLAMINE 1 MG/3DAYS TD PT72: 1.0000 | MEDICATED_PATCH | Freq: Once | TRANSDERMAL | Status: DC

## 2016-05-28 MED ORDER — MIDAZOLAM HCL 2 MG/2ML IJ SOLN
INTRAMUSCULAR | Status: AC
  Filled 2016-05-28: qty 2

## 2016-05-28 MED ORDER — KETOROLAC TROMETHAMINE 30 MG/ML IJ SOLN
INTRAMUSCULAR | Status: DC | PRN
  Administered 2016-05-28: 30 mg via INTRAVENOUS

## 2016-05-28 MED ORDER — IBUPROFEN 400 MG PO TABS: 600.0000 mg | ORAL_TABLET | Freq: Four times a day (QID) | ORAL | 0 refills | Status: DC | PRN

## 2016-05-28 MED ORDER — ACETIC ACID 5 % SOLN
Status: AC
  Filled 2016-05-28: qty 500

## 2016-05-28 MED ORDER — FENTANYL CITRATE (PF) 100 MCG/2ML IJ SOLN
INTRAMUSCULAR | Status: DC | PRN
  Administered 2016-05-28 (×2): 50 ug via INTRAVENOUS

## 2016-05-28 MED ORDER — LIDOCAINE HCL (CARDIAC) 20 MG/ML IV SOLN
INTRAVENOUS | Status: DC | PRN
  Administered 2016-05-28: 50 mg via INTRAVENOUS

## 2016-05-28 MED ORDER — ACETAMINOPHEN 160 MG/5ML PO SOLN: 325.0000 mg | ORAL | Status: DC | PRN

## 2016-05-28 MED ORDER — FENTANYL CITRATE (PF) 100 MCG/2ML IJ SOLN
INTRAMUSCULAR | Status: AC
  Administered 2016-05-28: 25 ug via INTRAVENOUS
  Filled 2016-05-28: qty 2

## 2016-05-28 MED ORDER — ONDANSETRON HCL 4 MG/2ML IJ SOLN
INTRAMUSCULAR | Status: AC
  Filled 2016-05-28: qty 2

## 2016-05-28 MED ORDER — MIDAZOLAM HCL 2 MG/2ML IJ SOLN
INTRAMUSCULAR | Status: DC | PRN
  Administered 2016-05-28 (×2): 1 mg via INTRAVENOUS

## 2016-05-28 SURGICAL SUPPLY — 21 items
APPLICATOR COTTON TIP 6IN STRL (MISCELLANEOUS) IMPLANT
CATH ROBINSON RED A/P 16FR (CATHETERS) ×3 IMPLANT
CLOTH BEACON ORANGE TIMEOUT ST (SAFETY) ×3 IMPLANT
CONTAINER PREFILL 10% NBF 15ML (MISCELLANEOUS) ×6 IMPLANT
COVER BACK TABLE 60X90IN (DRAPES) ×3 IMPLANT
DECANTER SPIKE VIAL GLASS SM (MISCELLANEOUS) ×3 IMPLANT
GLOVE BIO SURGEON STRL SZ 6 (GLOVE) ×3 IMPLANT
GLOVE BIOGEL PI IND STRL 6.5 (GLOVE) ×4 IMPLANT
GLOVE BIOGEL PI IND STRL 7.0 (GLOVE) ×2 IMPLANT
GLOVE BIOGEL PI INDICATOR 6.5 (GLOVE) ×2
GLOVE BIOGEL PI INDICATOR 7.0 (GLOVE) ×1
GLOVE SURG SS PI 6.0 STRL IVOR (GLOVE) ×3 IMPLANT
GOWN STRL REUS W/TWL LRG LVL3 (GOWN DISPOSABLE) ×6 IMPLANT
NS IRRIG 1000ML POUR BTL (IV SOLUTION) ×3 IMPLANT
PACK VAGINAL MINOR WOMEN LF (CUSTOM PROCEDURE TRAY) ×3 IMPLANT
PAD OB MATERNITY 4.3X12.25 (PERSONAL CARE ITEMS) ×3 IMPLANT
PAD PREP 24X48 CUFFED NSTRL (MISCELLANEOUS) ×3 IMPLANT
SCOPETTES 8  STERILE (MISCELLANEOUS)
SCOPETTES 8 STERILE (MISCELLANEOUS) IMPLANT
TOWEL OR 17X24 6PK STRL BLUE (TOWEL DISPOSABLE) ×6 IMPLANT
WATER STERILE IRR 1000ML POUR (IV SOLUTION) ×3 IMPLANT

## 2016-05-28 NOTE — Anesthesia Postprocedure Evaluation (Addendum)
Anesthesia Post Note  Patient: Brandy Galloway  Procedure(s) Performed: Procedure(s) (LRB): DILATATION AND CURETTAGE (N/A) COLPOSCOPY (N/A) EXAM UNDER ANESTHESIA (N/A)  Patient location during evaluation: PACU Anesthesia Type: General Level of consciousness: awake Pain management: pain level controlled Vital Signs Assessment: post-procedure vital signs reviewed and stable Respiratory status: spontaneous breathing Cardiovascular status: stable Postop Assessment: no signs of nausea or vomiting Anesthetic complications: no        Last Vitals:  Vitals:   05/28/16 1245 05/28/16 1258  BP: (!) 162/92   Pulse: 68 74  Resp: 16 20  Temp:  36.7 C    Last Pain:  Vitals:   05/28/16 1258  TempSrc: Oral   Pain Goal:                 [name],[name]

## 2016-05-28 NOTE — Transfer of Care (Signed)
Immediate Anesthesia Transfer of Care Note  Patient: Brandy Galloway  Procedure(s) Performed: Procedure(s): DILATATION AND CURETTAGE (N/A) COLPOSCOPY (N/A) EXAM UNDER ANESTHESIA (N/A)  Patient Location: PACU  Anesthesia Type:General  Level of Consciousness: awake, alert  and oriented  Airway & Oxygen Therapy: Patient Spontanous Breathing and Patient connected to nasal cannula oxygen  Post-op Assessment: Report given to RN and Post -op Vital signs reviewed and stable  Post vital signs: Reviewed and stable  Last Vitals:  Vitals:   05/28/16 1012  BP: (!) 159/92  Pulse: 74  Resp: 16  Temp: 36.6 C    Last Pain:  Vitals:   05/28/16 1012  TempSrc: Oral         Complications: No apparent anesthesia complications

## 2016-05-28 NOTE — Op Note (Signed)
PROCEDURE DATE: 05/28/2016  PREOPERATIVE DIAGNOSIS: LGSIL on pap smear and postmenopausal vaginal bleeding POSTOPERATIVE DIAGNOSIS: The same. PROCEDURE:     Colposcopy and dilatation and curretage. SURGEON:  Dr. Elly Modena  INDICATIONS: 51 y.o. yo G0P0 with LGSIL on 12/2015 pap smear and PMB here for colposcopy and dilatation and curretage.  Risks of surgery were discussed with the patient including but not limited to: bleeding which may require transfusion; infection which may require antibiotics; injury to uterus or surrounding organs;need for additional procedures including laparotomy or laparoscopy; possibility of intrauterine scarring which may impair future fertility; and other postoperative/anesthesia complications. Written informed consent was obtained.    FINDINGS:  Normal cervix, flush with vaginal wall. No acetowhite lesions visualized. ECC collected. An 8-week size midline uterus, moderate amounts of endometrial curretings, specimen sent to pathology.  ANESTHESIA:    Monitored intravenous sedation INTRAVENOUS FLUIDS:  1000 ml of LR ESTIMATED BLOOD LOSS:  Less than 20 ml. URINE OUTPUT: 300 cc SPECIMENS:  ECC and endometrial curettings sent to pathology COMPLICATIONS:  None immediate.  PROCEDURE DETAILS:  The patient was taken to the operating room where general anesthesia was administered and was found to be adequate.  After an adequate timeout was performed, she was placed in the dorsal lithotomy position and examined; then prepped and draped in the sterile manner.   Her bladder was catheterized and 300cc clear, yellow urine was evacuated. A vaginal speculum was then placed in the patient's vagina. Cervix viewed with speculum and colposcope after application of acetic acid.   Colposcopy adequate?  No TZ not visualized Acetowhite lesions? none Punctation? no Mosaicism?  no Abnormal vasculature?  no Biopsies? no ECC? collected  A single tooth tenaculum was then applied to the  anterior lip of the cervix.  The cervix was gently dilated through serial insertion of Hagar dilators to accommodate a 6 mm suction curved curette that was gently advanced to the uterine fundus.  A sharp curettage was then performed with a moderate amount of endometrial curetting obtained. There was minimal bleeding noted and the tenaculum removed with good hemostasis noted.  The patient tolerated the procedure well.  The patient was taken to the recovery area in stable condition.

## 2016-05-28 NOTE — Anesthesia Procedure Notes (Signed)
Procedure Name: LMA Insertion Date/Time: 05/28/2016 11:15 AM Performed by: Jonna Munro Pre-anesthesia Checklist: Patient identified, Emergency Drugs available, Suction available, Timeout performed and Patient being monitored Patient Re-evaluated:Patient Re-evaluated prior to inductionOxygen Delivery Method: Circle system utilized Preoxygenation: Pre-oxygenation with 100% oxygen Intubation Type: IV induction Ventilation: Mask ventilation without difficulty LMA: LMA inserted LMA Size: 4.0 Number of attempts: 1 Placement Confirmation: positive ETCO2 and breath sounds checked- equal and bilateral Tube secured with: Tape Dental Injury: Teeth and Oropharynx as per pre-operative assessment

## 2016-05-28 NOTE — Anesthesia Preprocedure Evaluation (Signed)
Anesthesia Evaluation  Patient identified by MRN, date of birth, ID band Patient awake    Reviewed: Allergy & Precautions, H&P , NPO status , Patient's Chart, lab work & pertinent test results  Airway Mallampati: IV  TM Distance: >3 FB Neck ROM: full    Dental  (+) Edentulous Upper   Pulmonary neg pulmonary ROS,    Pulmonary exam normal breath sounds clear to auscultation       Cardiovascular hypertension, On Medications Normal cardiovascular exam     Neuro/Psych PSYCHIATRIC DISORDERS Mental Retardation   GI/Hepatic negative GI ROS, Neg liver ROS,   Endo/Other  negative endocrine ROS  Renal/GU negative Renal ROS  negative genitourinary   Musculoskeletal   Abdominal Normal abdominal exam  (+)   Peds  Hematology negative hematology ROS (+)   Anesthesia Other Findings   Reproductive/Obstetrics negative OB ROS                             Anesthesia Physical  Anesthesia Plan  ASA: II  Anesthesia Plan: General LMA   Post-op Pain Management:    Induction: Intravenous  Airway Management Planned:   Additional Equipment:   Intra-op Plan:   Post-operative Plan:   Informed Consent: I have reviewed the patients History and Physical, chart, labs and discussed the procedure including the risks, benefits and alternatives for the proposed anesthesia with the patient or authorized representative who has indicated his/her understanding and acceptance.     Plan Discussed with: CRNA and Surgeon  Anesthesia Plan Comments:         Anesthesia Quick Evaluation

## 2016-05-28 NOTE — Discharge Instructions (Signed)
DISCHARGE INSTRUCTIONS: D&C / D&E °The following instructions have been prepared to help you care for yourself upon your return home. °  °Personal hygiene: °• Use sanitary pads for vaginal drainage, not tampons. °• Shower the day after your procedure. °• NO tub baths, pools or Jacuzzis for 2-3 weeks. °• Wipe front to back after using the bathroom. ° °Activity and limitations: °• Do NOT drive or operate any equipment for 24 hours. The effects of anesthesia are still present and drowsiness may result. °• Do NOT rest in bed all day. °• Walking is encouraged. °• Walk up and down stairs slowly. °• You may resume your normal activity in one to two days or as indicated by your physician. ° °Sexual activity: NO intercourse for at least 2 weeks after the procedure, or as indicated by your physician. ° °Diet: Eat a light meal as desired this evening. You may resume your usual diet tomorrow. ° °Return to work: You may resume your work activities in one to two days or as indicated by your doctor. ° °What to expect after your surgery: Expect to have vaginal bleeding/discharge for 2-3 days and spotting for up to 10 days. It is not unusual to have soreness for up to 1-2 weeks. You may have a slight burning sensation when you urinate for the first day. Mild cramps may continue for a couple of days. You may have a regular period in 2-6 weeks. ° °Call your doctor for any of the following: °• Excessive vaginal bleeding, saturating and changing one pad every hour. °• Inability to urinate 6 hours after discharge from hospital. °• Pain not relieved by pain medication. °• Fever of 100.4° F or greater. °• Unusual vaginal discharge or odor. ° ° Call for an appointment:  ° ° °Patient’s signature: ______________________ ° °Nurse’s signature ________________________ ° °Support person's signature_______________________ ° ° °DISCHARGE INSTRUCTIONS: D&C / D&E °The following instructions have been prepared to help you care for yourself upon your  return home. °  °Personal hygiene: °• Use sanitary pads for vaginal drainage, not tampons. °• Shower the day after your procedure. °• NO tub baths, pools or Jacuzzis for 2-3 weeks. °• Wipe front to back after using the bathroom. ° °Activity and limitations: °• Do NOT drive or operate any equipment for 24 hours. The effects of anesthesia are still present and drowsiness may result. °• Do NOT rest in bed all day. °• Walking is encouraged. °• Walk up and down stairs slowly. °• You may resume your normal activity in one to two days or as indicated by your physician. ° °Sexual activity: NO intercourse for at least 2 weeks after the procedure, or as indicated by your physician. ° °Diet: Eat a light meal as desired this evening. You may resume your usual diet tomorrow. ° °Return to work: You may resume your work activities in one to two days or as indicated by your doctor. ° °What to expect after your surgery: Expect to have vaginal bleeding/discharge for 2-3 days and spotting for up to 10 days. It is not unusual to have soreness for up to 1-2 weeks. You may have a slight burning sensation when you urinate for the first day. Mild cramps may continue for a couple of days. You may have a regular period in 2-6 weeks. ° °Call your doctor for any of the following: °• Excessive vaginal bleeding, saturating and changing one pad every hour. °• Inability to urinate 6 hours after discharge from hospital. °• Pain not relieved by   pain medication.  Fever of 100.4 F or greater.  Unusual vaginal discharge or odor.   Call for an appointment:    Patients signature: ______________________  Nurses signature ________________________  Support person's signature_______________________    Post Anesthesia Home Care Instructions  Activity: Get plenty of rest for the remainder of the day. A responsible adult should stay with you for 24 hours following the procedure.  For the next 24 hours, DO NOT: -Drive a car -Conservation officer, nature -Drink alcoholic beverages -Take any medication unless instructed by your physician -Make any legal decisions or sign important papers.  Meals: Start with liquid foods such as gelatin or soup. Progress to regular foods as tolerated. Avoid greasy, spicy, heavy foods. If nausea and/or vomiting occur, drink only clear liquids until the nausea and/or vomiting subsides. Call your physician if vomiting continues.  Special Instructions/Symptoms: Your throat may feel dry or sore from the anesthesia or the breathing tube placed in your throat during surgery. If this causes discomfort, gargle with warm salt water. The discomfort should disappear within 24 hours.  If you had a scopolamine patch placed behind your ear for the management of post- operative nausea and/or vomiting:  1. The medication in the patch is effective for 72 hours, after which it should be removed.  Wrap patch in a tissue and discard in the trash. Wash hands thoroughly with soap and water. 2. You may remove the patch earlier than 72 hours if you experience unpleasant side effects which may include dry mouth, dizziness or visual disturbances. 3. Avoid touching the patch. Wash your hands with soap and water after contact with the patch.    NO IBUPROFEN PRODUCTS (MOTRIN, ADVIL) OR ALEVE UNTIL 5:30PM TODAY.

## 2016-05-29 ENCOUNTER — Telehealth: Payer: Self-pay

## 2016-05-29 ENCOUNTER — Encounter (HOSPITAL_COMMUNITY): Payer: Self-pay | Admitting: Obstetrics and Gynecology

## 2016-05-29 ENCOUNTER — Other Ambulatory Visit: Payer: Self-pay | Admitting: Obstetrics and Gynecology

## 2016-05-29 MED ORDER — MEDROXYPROGESTERONE ACETATE 150 MG/ML IM SUSP: 150.0000 mg | INTRAMUSCULAR | 0 refills | Status: DC

## 2016-05-29 NOTE — Telephone Encounter (Signed)
-----   Message from Mora Bellman, MD sent at 05/29/2016  2:09 PM EST ----- Please inform patient and her caregiver of negative endometrial biopsy and colposcopy.  Patient can come in for depo-provera administration (Rx has been e-prescribed).   Plan is for repeat pap smear in September  Thanks  Peggy

## 2016-05-29 NOTE — Telephone Encounter (Signed)
Spoke with caregiver/uncle. He is aware neg Ebx and colpo. Depo has been sent to the pharmacy. Uncle will c/b when ready for the injection. Rpt pap smear due in Sept.

## 2016-06-05 ENCOUNTER — Telehealth: Payer: Self-pay

## 2016-06-05 ENCOUNTER — Telehealth: Payer: Self-pay | Admitting: *Deleted

## 2016-06-05 NOTE — Telephone Encounter (Signed)
Patient's uncle/caregiver, [name] called to get refill on rx for patient. Advised that patient has refills at the pharmacy and to contact us if needed.

## 2016-06-05 NOTE — Telephone Encounter (Addendum)
Message left by pt's uncle [name]) requesting a call back from the doctor that performed surgery (Constant) on Billiejo. He is her uncle and the payer of her bills. He further stated that he was told by the doctor to call if there should be any changes in Adela's condition. Per chart review, [name] has legal guardianship of Tikia and copy of document has been scanned to her chart.  I called back and left a message stating that the doctor who performed surgery is not in the office today. Any of our nursing staff should be able to help him or we can contact a physician on his behalf. Please call back and leave another message stating his questions, concerns and whether a detailed message can be left on his voice mail.   2/15  Spoke w/Mr. Johnson and he informed me that his concern has been "taken care of".  Sedona has appt today for Depo Provera injection. Her had no further questions.

## 2016-06-06 ENCOUNTER — Ambulatory Visit (INDEPENDENT_AMBULATORY_CARE_PROVIDER_SITE_OTHER): Payer: Medicaid Other

## 2016-06-06 DIAGNOSIS — N950 Postmenopausal bleeding: Secondary | ICD-10-CM

## 2016-06-06 MED ORDER — MEDROXYPROGESTERONE ACETATE 150 MG/ML IM SUSP
150.0000 mg | Freq: Once | INTRAMUSCULAR | Status: AC
Start: 2016-06-06 — End: 2016-06-06
  Administered 2016-06-06: 150 mg via INTRAMUSCULAR

## 2016-06-06 NOTE — Progress Notes (Signed)
Nurse visit for Depo to control Vb. Pt tolerate injection well.

## 2016-08-22 ENCOUNTER — Other Ambulatory Visit: Payer: Self-pay

## 2016-08-22 ENCOUNTER — Telehealth: Payer: Self-pay

## 2016-08-22 DIAGNOSIS — N939 Abnormal uterine and vaginal bleeding, unspecified: Secondary | ICD-10-CM

## 2016-08-22 MED ORDER — MEDROXYPROGESTERONE ACETATE 150 MG/ML IM SUSP: 150.0000 mg | INTRAMUSCULAR | 3 refills | Status: DC

## 2016-08-22 NOTE — Telephone Encounter (Signed)
Patient notified of RX at [facility]

## 2016-08-22 NOTE — Progress Notes (Signed)
Pt called asked to switch Pharmacy. RX sent to [place]

## 2016-08-26 ENCOUNTER — Ambulatory Visit (INDEPENDENT_AMBULATORY_CARE_PROVIDER_SITE_OTHER): Payer: Medicaid Other | Admitting: *Deleted

## 2016-08-26 VITALS — BP 171/86 | HR 85 | Wt 149.8 lb

## 2016-08-26 DIAGNOSIS — N950 Postmenopausal bleeding: Secondary | ICD-10-CM | POA: Diagnosis not present

## 2016-08-26 DIAGNOSIS — N939 Abnormal uterine and vaginal bleeding, unspecified: Secondary | ICD-10-CM

## 2016-08-26 MED ORDER — MEDROXYPROGESTERONE ACETATE 150 MG/ML IM SUSP
150.0000 mg | Freq: Once | INTRAMUSCULAR | Status: AC
  Administered 2016-08-26: 150 mg via INTRAMUSCULAR

## 2016-08-26 MED ORDER — MEDROXYPROGESTERONE ACETATE 150 MG/ML IM SUSP: 150.0000 mg | INTRAMUSCULAR | 3 refills | Status: DC

## 2016-08-26 NOTE — Progress Notes (Signed)
Patient is in the office today for her Depo shot to control her bleeding.

## 2016-09-23 ENCOUNTER — Telehealth: Payer: Self-pay | Admitting: *Deleted

## 2016-09-23 NOTE — Telephone Encounter (Signed)
Spoke with Mr Brandy Galloway regarding Michele's care. Mr Brandy Galloway states that Brandy Galloway has recently has some oral surgery and shortly after she noticed that she was having a menstrual.  Mr Brandy Galloway states that her bleeding was heavy at first but has gotten lighter since.  Mr Brandy Galloway made aware message would be sent to provider for any further recommendations on vaginal bleeding. Pt did receive last depo injection May 7,2018.   Please advise on any other recommendations for bleeding.

## 2016-09-27 NOTE — Addendum Note (Signed)
Addendum  created 09/27/16 0949 by Lyn Hollingshead, MD   Sign clinical note

## 2016-10-14 ENCOUNTER — Other Ambulatory Visit: Payer: Self-pay

## 2016-10-14 NOTE — Telephone Encounter (Signed)
Received VM from pt's caregiver uncle "[name]" requesting rf on IB 400 mg. Advised caregiver I will consult with provider and c/b.

## 2016-10-15 ENCOUNTER — Other Ambulatory Visit: Payer: Self-pay | Admitting: Obstetrics and Gynecology

## 2016-10-15 MED ORDER — IBUPROFEN 600 MG PO TABS: 600.0000 mg | ORAL_TABLET | Freq: Four times a day (QID) | ORAL | 3 refills | Status: DC | PRN

## 2016-10-15 NOTE — Telephone Encounter (Signed)
Unable to LVM. VM not set up 

## 2016-10-16 NOTE — Telephone Encounter (Signed)
Brandy Galloway is aware rx is at the pharmacy.

## 2016-11-12 ENCOUNTER — Ambulatory Visit (INDEPENDENT_AMBULATORY_CARE_PROVIDER_SITE_OTHER): Payer: Medicaid Other

## 2016-11-12 DIAGNOSIS — N950 Postmenopausal bleeding: Secondary | ICD-10-CM

## 2016-11-12 MED ORDER — MEDROXYPROGESTERONE ACETATE 150 MG/ML IM SUSP
150.0000 mg | Freq: Once | INTRAMUSCULAR | Status: AC
  Administered 2016-11-12: 150 mg via INTRAMUSCULAR

## 2016-11-12 NOTE — Progress Notes (Signed)
Nurse visit for pt supply Depo inj given R upper outer quad w/o difficulty. Next Depo due 10/8-10/23 caregiver agrees.

## 2017-01-22 ENCOUNTER — Other Ambulatory Visit: Payer: Self-pay

## 2017-01-28 ENCOUNTER — Ambulatory Visit (INDEPENDENT_AMBULATORY_CARE_PROVIDER_SITE_OTHER): Payer: Medicaid Other

## 2017-01-28 DIAGNOSIS — Z3042 Encounter for surveillance of injectable contraceptive: Secondary | ICD-10-CM | POA: Diagnosis not present

## 2017-01-28 MED ORDER — MEDROXYPROGESTERONE ACETATE 150 MG/ML IM SUSP
150.0000 mg | INTRAMUSCULAR | Status: DC
  Administered 2017-01-28 – 2017-09-30 (×2): 150 mg via INTRAMUSCULAR

## 2017-01-28 NOTE — Progress Notes (Signed)
Patient is in the office for depo injection, administered and pt tolerated well .Marland Kitchen Administrations This Visit    medroxyPROGESTERone (DEPO-PROVERA) injection 150 mg    Admin Date 01/28/2017 Action Given Dose 150 mg Route Intramuscular Administered By Hinton Lovely, RN

## 2017-01-30 ENCOUNTER — Other Ambulatory Visit: Payer: Self-pay

## 2017-02-05 ENCOUNTER — Ambulatory Visit (INDEPENDENT_AMBULATORY_CARE_PROVIDER_SITE_OTHER): Payer: Medicaid Other | Admitting: Obstetrics and Gynecology

## 2017-02-05 ENCOUNTER — Ambulatory Visit (HOSPITAL_COMMUNITY)
Admission: EM | Admit: 2017-02-05 | Discharge: 2017-02-05 | Disposition: A | Discharge status: Home / Self Care | Payer: Medicaid Other | Attending: Family Medicine | Admitting: Family Medicine

## 2017-02-05 ENCOUNTER — Encounter: Payer: Self-pay | Admitting: Obstetrics and Gynecology

## 2017-02-05 ENCOUNTER — Encounter (HOSPITAL_COMMUNITY): Payer: Self-pay | Admitting: Emergency Medicine

## 2017-02-05 VITALS — BP 181/122 | HR 78 | Wt 171.0 lb

## 2017-02-05 DIAGNOSIS — R52 Pain, unspecified: Secondary | ICD-10-CM

## 2017-02-05 DIAGNOSIS — I10 Essential (primary) hypertension: Secondary | ICD-10-CM | POA: Diagnosis not present

## 2017-02-05 DIAGNOSIS — T8089XA Other complications following infusion, transfusion and therapeutic injection, initial encounter: Principal | ICD-10-CM

## 2017-02-05 NOTE — ED Triage Notes (Signed)
Pt caregiver states her blood pressure is up, has been eating a lot of potato chips and popcorn and hamburgers. Pt given blood pressure medicine today by caregiver.

## 2017-02-05 NOTE — Discharge Instructions (Signed)
Your BP tonight was 216/101. Please call your primary care doctor to let them know and to schedule follow up.

## 2017-02-05 NOTE — Progress Notes (Signed)
   GYNECOLOGY OFFICE VISIT NOTE  History:  51 y.o. G0P0 here today for pain at depo injection site from 10/9/8. No other GYN complaints. On questioning, also reports eye pain, denies headache.  Past Medical History:  Diagnosis Date  . Anxiety   . Hypertension   . 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 Mental retardation   . Seizures ([place])    none in 20 years or more    Past Surgical History:  Procedure Laterality Date  . CERVICAL CONE BIOPSY  2009  . CERVICAL CONIZATION W/BX  08/20/2011   Procedure: CONIZATION CERVIX WITH BIOPSY;  Surgeon: Emily Filbert, MD;  Location: [facility];  Service: Gynecology;  Laterality: N/A;  With Endocervical Currettage  . COLPOSCOPY  06/2011  . COLPOSCOPY N/A 05/28/2016   Procedure: COLPOSCOPY;  Surgeon: Mora Bellman, MD;  Location: [facility];  Service: Gynecology;  Laterality: N/A;  . DILATION AND CURETTAGE OF UTERUS N/A 05/28/2016   Procedure: DILATATION AND CURETTAGE;  Surgeon: Mora Bellman, MD;  Location: [facility];  Service: Gynecology;  Laterality: N/A;  . LEEP  2009    The following portions of the patient's history were reviewed and updated as appropriate: allergies, current medications, past family history, past medical history, past social history, past surgical history and problem list.   Review of Systems:  Pertinent items noted in HPI and remainder of comprehensive ROS otherwise negative.   Objective:  Physical Exam BP (!) 201/88   Pulse 78   Wt 171 lb (77.6 kg)   BMI 27.60 kg/m  CONSTITUTIONAL: Well-developed, well-nourished female in no acute distress. SKIN: Skin is warm and dry. No rash noted. Not diaphoretic. No erythema. No pallor. NEUROLOGIC: Alert and oriented to person, place, and time. Normal reflexes, muscle tone coordination. No cranial nerve deficit noted. PSYCHIATRIC: Some degree of mental impairment present, patient cooperative and able to answer questions but tangential with answers, uanble to  assess if this is baseline however uncle whom she lives with states this is her normal state CARDIOVASCULAR: Normal heart rate noted RESPIRATORY: Effort and breath sounds normal, no problems with respiration noted ABDOMEN: Soft, no distention noted.    BACK: bandaid present over injection site, no erythema, induration, bruising appreciated, minimal tenderness PELVIC: Deferred MUSCULOSKELETAL: Normal range of motion. No edema noted.  Labs and Imaging No results found.  Assessment & Plan:  1. Pain at injection site, initial encounter No obvious lesion at injection site Recommended ice for pain prn  2. Essential hypertension BP elevated from 184-201/88-124 Pt reports pain in her eyes  Patient with significantly elevated BP and eye pain, attempted to get patient in to see PCP for elevated BP but they are closed today, recommended she present to urgent care, she and uncle (caregiver) are agreeable  Routine preventative health maintenance measures emphasized. Please refer to After Visit Summary for other counseling recommendations.   3 months for depo    Feliz Beam, M.D. Attending [place], [facility] for [employer], [place]

## 2017-02-05 NOTE — ED Notes (Signed)
MD at bedside. 

## 2017-02-08 NOTE — ED Provider Notes (Signed)
  [place]   [id number] 02/05/17 Arrival Time: 1245  ASSESSMENT & PLAN:  1. Essential hypertension    Her caregiver reports that her PCP has placed her on blood pressure medications but she has not started taking them today. Will begin tonight and schedule f/u with PCP. May f/u here as needed. Reviewed expectations re: course of current medical issues. Questions answered. Outlined signs and symptoms indicating need for more acute intervention. Patient verbalized understanding. After Visit Summary given.   SUBJECTIVE:  Brandy Galloway is a 51 y.o. female who presents with complaint of increased BP. History from caregiver who reports she has not taken medications given by PCP. He does not know the names of her current BP medications today. Otherwise she is feeling well.  ROS: As per HPI.   OBJECTIVE:  Vitals:   02/05/17 1632  BP: (!) 216/101  Pulse: 76  Resp: 16  Temp: 98.9 F (37.2 C)  TempSrc: Oral  SpO2: 100%    General appearance: alert; no distress Eyes: PERRLA; EOMI; conjunctiva normal Neck: supple Lungs: clear to auscultation bilaterally Heart: regular rate and rhythm Extremities: no cyanosis or edema; symmetrical with no gross deformities Skin: warm and dry Psychological: alert and cooperative; normal mood and affect  No Known Allergies  Past Medical History:  Diagnosis Date  . Anxiety   . Hypertension   . 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 Mental retardation   . Seizures ([place])    none in 20 years or more   Social History   Social History  . Marital status: Single    Spouse name: N/A  . Number of children: N/A  . Years of education: N/A   Occupational History  . Not on file.   Social History Main Topics  . Smoking status: Never Smoker  . Smokeless tobacco: Never Used  . Alcohol use No  . Drug use: No  . Sexual activity: No   Other Topics Concern  . Not on file   Social History  Narrative  . No narrative on file   Family History  Problem Relation Age of Onset  . Diabetes Father   . Diabetes Mother    Past Surgical History:  Procedure Laterality Date  . CERVICAL CONE BIOPSY  2009  . CERVICAL CONIZATION W/BX  08/20/2011   Procedure: CONIZATION CERVIX WITH BIOPSY;  Surgeon: Emily Filbert, MD;  Location: [facility];  Service: Gynecology;  Laterality: N/A;  With Endocervical Currettage  . COLPOSCOPY  06/2011  . COLPOSCOPY N/A 05/28/2016   Procedure: COLPOSCOPY;  Surgeon: Mora Bellman, MD;  Location: [facility];  Service: Gynecology;  Laterality: N/A;  . DILATION AND CURETTAGE OF UTERUS N/A 05/28/2016   Procedure: DILATATION AND CURETTAGE;  Surgeon: Mora Bellman, MD;  Location: [facility];  Service: Gynecology;  Laterality: N/A;  . LEEP  2009     Vanessa Kick, MD 02/08/17 1143

## 2017-02-20 ENCOUNTER — Other Ambulatory Visit: Payer: Self-pay

## 2017-02-24 ENCOUNTER — Ambulatory Visit
Admission: RE | Admit: 2017-02-24 | Discharge: 2017-02-24 | Disposition: A | Discharge status: Home / Self Care | Payer: Medicaid Other | Source: Ambulatory Visit | Attending: Internal Medicine | Admitting: Internal Medicine

## 2017-02-24 DIAGNOSIS — E2839 Other primary ovarian failure: Secondary | ICD-10-CM

## 2017-04-17 ENCOUNTER — Ambulatory Visit (INDEPENDENT_AMBULATORY_CARE_PROVIDER_SITE_OTHER): Payer: Medicaid Other

## 2017-04-17 DIAGNOSIS — Z3042 Encounter for surveillance of injectable contraceptive: Secondary | ICD-10-CM

## 2017-04-17 MED ORDER — MEDROXYPROGESTERONE ACETATE 150 MG/ML IM SUSP
150.0000 mg | Freq: Once | INTRAMUSCULAR | Status: AC
  Administered 2017-04-17: 150 mg via INTRAMUSCULAR

## 2017-04-17 NOTE — Progress Notes (Signed)
Pt here for depo shot. Last depo given 01/28/17. Inj given in left upper outer quad. Pt tolerated well. Next depo due 3/14-3/28

## 2017-07-07 ENCOUNTER — Ambulatory Visit (INDEPENDENT_AMBULATORY_CARE_PROVIDER_SITE_OTHER): Payer: Medicaid Other

## 2017-07-07 DIAGNOSIS — Z3042 Encounter for surveillance of injectable contraceptive: Secondary | ICD-10-CM | POA: Diagnosis not present

## 2017-07-07 MED ORDER — MEDROXYPROGESTERONE ACETATE 150 MG/ML IM SUSP
150.0000 mg | Freq: Once | INTRAMUSCULAR | Status: AC
  Administered 2017-07-07: 150 mg via INTRAMUSCULAR

## 2017-07-07 MED ORDER — MEDROXYPROGESTERONE ACETATE 150 MG/ML IM SUSP: 150.0000 mg | Freq: Once | INTRAMUSCULAR | Status: DC

## 2017-07-07 NOTE — Progress Notes (Signed)
Nurse visit for pt supplied Depo given L upper outer quad per w/o difficulty. Next Depo due Jun 3-17

## 2017-09-22 ENCOUNTER — Other Ambulatory Visit: Payer: Self-pay | Admitting: Obstetrics and Gynecology

## 2017-09-22 ENCOUNTER — Telehealth: Payer: Self-pay | Admitting: *Deleted

## 2017-09-22 MED ORDER — IBUPROFEN 600 MG PO TABS: 600.0000 mg | ORAL_TABLET | Freq: Four times a day (QID) | ORAL | 5 refills | Status: DC | PRN

## 2017-09-22 NOTE — Telephone Encounter (Signed)
Spoke with pt uncle(caregiver) re: Rx. He states that pt is out of Ibuprofen 600mg  and request refill.   Please send refill if approved.

## 2017-09-29 ENCOUNTER — Telehealth: Payer: Self-pay

## 2017-09-29 NOTE — Telephone Encounter (Signed)
Pts caregiver states he got the refill on the pts ibuprofen, he states he has no other questions at this time.

## 2017-09-30 ENCOUNTER — Other Ambulatory Visit: Payer: Self-pay | Admitting: Certified Nurse Midwife

## 2017-09-30 ENCOUNTER — Other Ambulatory Visit: Payer: Self-pay | Admitting: Obstetrics and Gynecology

## 2017-09-30 ENCOUNTER — Ambulatory Visit: Payer: Medicaid Other

## 2017-09-30 ENCOUNTER — Ambulatory Visit (INDEPENDENT_AMBULATORY_CARE_PROVIDER_SITE_OTHER): Payer: Medicaid Other | Admitting: *Deleted

## 2017-09-30 VITALS — BP 181/87 | HR 97 | Wt 192.0 lb

## 2017-09-30 DIAGNOSIS — Z3042 Encounter for surveillance of injectable contraceptive: Secondary | ICD-10-CM | POA: Diagnosis not present

## 2017-09-30 DIAGNOSIS — N939 Abnormal uterine and vaginal bleeding, unspecified: Secondary | ICD-10-CM

## 2017-09-30 MED ORDER — MEDROXYPROGESTERONE ACETATE 150 MG/ML IM SUSP: 150.0000 mg | INTRAMUSCULAR | 3 refills | Status: DC

## 2017-09-30 NOTE — Progress Notes (Signed)
Pt is in office for depo injection. Pt supplied depo for today's injection. Pt tolerated injection well Pt and care giver have no other concerns.   Pt advised to RTO Aug 27-Sept 10 for next depo.  BP (!) 181/87   Pulse 97   Wt 192 lb (87.1 kg)   BMI 30.99 kg/m   Administrations This Visit    medroxyPROGESTERone (DEPO-PROVERA) injection 150 mg    Admin Date 09/30/2017 Action Given Dose 150 mg Route Intramuscular Administered By Valene Bors, CMA

## 2017-10-01 NOTE — Progress Notes (Signed)
I have reviewed the chart and agree with nursing staff's documentation of this patient's encounter.  Morene Crocker, CNM 10/01/2017 11:50 AM

## 2017-12-05 ENCOUNTER — Other Ambulatory Visit: Payer: Self-pay

## 2017-12-05 ENCOUNTER — Encounter: Payer: Self-pay | Admitting: Podiatry

## 2017-12-05 ENCOUNTER — Ambulatory Visit: Payer: Medicaid Other | Admitting: Podiatry

## 2017-12-05 ENCOUNTER — Ambulatory Visit (INDEPENDENT_AMBULATORY_CARE_PROVIDER_SITE_OTHER): Payer: Medicaid Other

## 2017-12-05 ENCOUNTER — Other Ambulatory Visit: Payer: Self-pay | Admitting: Podiatry

## 2017-12-05 VITALS — BP 161/93 | HR 110

## 2017-12-05 DIAGNOSIS — L600 Ingrowing nail: Secondary | ICD-10-CM

## 2017-12-05 DIAGNOSIS — M779 Enthesopathy, unspecified: Secondary | ICD-10-CM

## 2017-12-05 DIAGNOSIS — M79671 Pain in right foot: Secondary | ICD-10-CM

## 2017-12-05 DIAGNOSIS — M7751 Other enthesopathy of right foot: Secondary | ICD-10-CM | POA: Diagnosis not present

## 2017-12-05 DIAGNOSIS — L03031 Cellulitis of right toe: Secondary | ICD-10-CM

## 2017-12-05 MED ORDER — CEPHALEXIN 500 MG PO CAPS: 500.0000 mg | ORAL_CAPSULE | Freq: Three times a day (TID) | ORAL | 0 refills | Status: DC

## 2017-12-05 NOTE — Patient Instructions (Signed)

## 2017-12-08 ENCOUNTER — Encounter: Payer: Self-pay | Admitting: Podiatry

## 2017-12-08 NOTE — Progress Notes (Signed)
Subjective:  Brandy Galloway is a mentally challenged 52 yo AAF who presents to clinic today with cc of painful, ingrown toenail of right great toe medial border. Her uncle is her caregiver and says the toe has been bothering her.   Pain has been present for the past few weeks per her uncle.  Attempted treatments include: none  Past Medical History:  Diagnosis Date  . Anxiety   . Hypertension   . 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 Mental retardation   . Seizures ([place])    none in 20 years or more    Past Surgical History:  Procedure Laterality Date  . CERVICAL CONE BIOPSY  2009  . CERVICAL CONIZATION W/BX  08/20/2011   Procedure: CONIZATION CERVIX WITH BIOPSY;  Surgeon: Emily Filbert, MD;  Location: [facility];  Service: Gynecology;  Laterality: N/A;  With Endocervical Currettage  . COLPOSCOPY  06/2011  . COLPOSCOPY N/A 05/28/2016   Procedure: COLPOSCOPY;  Surgeon: Mora Bellman, MD;  Location: [facility];  Service: Gynecology;  Laterality: N/A;  . DILATION AND CURETTAGE OF UTERUS N/A 05/28/2016   Procedure: DILATATION AND CURETTAGE;  Surgeon: Mora Bellman, MD;  Location: [facility];  Service: Gynecology;  Laterality: N/A;  . LEEP  2009    Medications    amLODipine (NORVASC) 10 MG tablet    doxycycline (VIBRA-TABS) 100 MG tablet    hydrochlorothiazide (HYDRODIURIL) 12.5 MG tablet    ibuprofen (ADVIL,MOTRIN) 400 MG tablet    ibuprofen (ADVIL,MOTRIN) 600 MG tablet    chlorhexidine (PERIDEX) 0.12 % solution    docusate sodium (COLACE) 100 MG capsule    medroxyPROGESTERone (DEPO-PROVERA) 150 MG/ML injection    medroxyPROGESTERone (DEPO-PROVERA) 150 MG/ML injection    polyethylene glycol (MIRALAX / GLYCOLAX) packet    triamcinolone cream (KENALOG) 0.5 %    Allergies      No Known Allergies  Mark as: Review Complete   Tobacco History   Smoking Status  Never Smoker  Smokeless Tobacco Status  Never Used   Family History   Father (Deceased) Diabetes          Mother (Deceased) Diabetes    ROS: Per HPI unless specifically indicated in ROS section   Objective:  Vascular: Capillary refill time immediate x 10 Dorsalis Pedis pulses palpable b/l Posterior tibial pulses pallpable b/l Digital hair sparse b/l Skin temperature gradient warm to warm b/l  Dermatological Great toe noted to be incurvated with tenderness to palpation medial border right great toe. +nail border hypertrophy, +serous drainage. Purulence, erythema, granulation tissue absent b/l  Musculoskeletal Muscle strength b/l to all muscle groups  Neurological Sensation intact with 10 gram monofilament  Assessment: Paronychia right great toe Ingrown toenail medial border right great toe  Plan: 1. Discussed diagnosis of with treatment options. 2. Patient/POA agreed to have temporary/permanent partial/total nail avulsion of  3. Consent form signed and in chart 4. Prepped great toe with chlorhexidine/betadine 5. Local injection of  3 cc's 50/50 mix of 1% Lidocaine plain and 0.5% marcaine plain administered via hallux block. Offending nail plate freed utilizing elevator. Offending nail plae removed with hemostat. Border cleansed with alcohol. Light bleeding controlled with silver nitrate stick. Light dressng applied. 6. Discused post-procedure instructions of epsom salt warm water soaks and dispensed written handout. 7. Rx for oral antibiotics: keflex 500 mg po tid x 10 days 8. Patient/POA related understanding of post-procedure instructions 9. Follow up 2 weeks. Call office if there is increased redness, swelling,  drainage, odor or pain.

## 2017-12-09 ENCOUNTER — Ambulatory Visit: Payer: Medicaid Other | Admitting: Sports Medicine

## 2017-12-11 ENCOUNTER — Ambulatory Visit: Payer: Medicaid Other | Admitting: Podiatry

## 2017-12-17 ENCOUNTER — Ambulatory Visit (INDEPENDENT_AMBULATORY_CARE_PROVIDER_SITE_OTHER): Payer: Medicaid Other | Admitting: Podiatry

## 2017-12-17 DIAGNOSIS — L600 Ingrowing nail: Secondary | ICD-10-CM

## 2017-12-17 DIAGNOSIS — M79676 Pain in unspecified toe(s): Secondary | ICD-10-CM

## 2017-12-17 NOTE — Progress Notes (Signed)
Subjective:  Patient ID: Brandy Galloway, female    DOB: 1965-08-09,  MRN: [id number]  CC: Ingrown Nail R great toe  52 y.o. female presents with the above complaint. Had nail procedure 12/05/17 with Dr. Elisha Ponder 12/05/17 still having pain in the toe. Denie sdrainage. Doing soaks. States the "stitches" are hurting. Using tylenol. Here with her uncle who acts as caregiver.  Review of Systems: Negative except as noted in the HPI. Denies N/V/F/Ch.  Past Medical History:  Diagnosis Date  . Anxiety   . Hypertension   . 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 Mental retardation   . Seizures ([place])    none in 20 years or more    Current Outpatient Medications:  .  amLODipine (NORVASC) 10 MG tablet, TK 1 T PO QD, Disp: , Rfl: 2 .  cephALEXin (KEFLEX) 500 MG capsule, Take 1 capsule (500 mg total) by mouth 3 (three) times daily., Disp: 1 capsule, Rfl: 0 .  chlorhexidine (PERIDEX) 0.12 % solution, RINSE MOUTH WITH 15MLS FOR 30 SECONFS BID IN THE MORNING AND EVENING FOR 2 WEEKS, Disp: , Rfl: 0 .  docusate sodium (COLACE) 100 MG capsule, Take 100 mg by mouth daily., Disp: , Rfl:  .  doxycycline (VIBRA-TABS) 100 MG tablet, TK 1 T PO BID, Disp: , Rfl: 0 .  hydrochlorothiazide (HYDRODIURIL) 12.5 MG tablet, Take 12.5 mg by mouth daily., Disp: , Rfl: 2 .  ibuprofen (ADVIL,MOTRIN) 400 MG tablet, Take 1.5 tablets (600 mg total) by mouth every 6 (six) hours as needed for mild pain., Disp: 30 tablet, Rfl: 0 .  ibuprofen (ADVIL,MOTRIN) 600 MG tablet, Take 1 tablet (600 mg total) by mouth every 6 (six) hours as needed., Disp: 60 tablet, Rfl: 5 .  medroxyPROGESTERone (DEPO-PROVERA) 150 MG/ML injection, INJECT 1 ML( 150 MG) IN THE MUSCLE EVERY 3 MONTHS (Patient not taking: Reported on 12/05/2017), Disp: 1 mL, Rfl: 0 .  medroxyPROGESTERone (DEPO-PROVERA) 150 MG/ML injection, Inject 1 mL (150 mg total) into the muscle every 3 (three) months. (Patient not taking: Reported on  12/05/2017), Disp: 1 mL, Rfl: 3 .  polyethylene glycol (MIRALAX / GLYCOLAX) packet, Take 17 g by mouth daily as needed for mild constipation., Disp: , Rfl:  .  triamcinolone cream (KENALOG) 0.5 %, Apply 1 application topically 2 (two) times daily as needed (rash)., Disp: , Rfl:   Social History   Tobacco Use  Smoking Status Never Smoker  Smokeless Tobacco Never Used    No Known Allergies Objective:  There were no vitals filed for this visit. There is no height or weight on file to calculate BMI. Constitutional Well developed. Well nourished.  Vascular Dorsalis pedis pulses palpable bilaterally. Posterior tibial pulses palpable bilaterally. Capillary refill normal to all digits.  No cyanosis or clubbing noted. Pedal hair growth normal.  Neurologic Normal speech. Oriented to person, place, and time. Epicritic sensation to light touch grossly present bilaterally.  Dermatologic Nails well groomed and normal in appearance. No open wounds. No skin lesions. Retained ingrown nail R hallux. Nail border medially painful, overlying crust. No warmth or erthema.  Orthopedic: Normal joint ROM without pain or crepitus bilaterally. No visible deformities. No bony tenderness.   Radiographs: None Assessment:   1. Ingrown right big toenail   2. Pain around toenail    Plan:  Patient was evaluated and treated and all questions answered.  Ingrowing Nail -Nail site debrided with nail curette. Small retained nail fragment gently removed. -Dressed with silvadene  and DSD. -Continue soaking until pain resolves. -F/u PRN  Return if symptoms worsen or fail to improve.

## 2017-12-23 ENCOUNTER — Ambulatory Visit (INDEPENDENT_AMBULATORY_CARE_PROVIDER_SITE_OTHER): Payer: Medicaid Other

## 2017-12-23 VITALS — BP 177/88 | HR 83 | Wt 191.4 lb

## 2017-12-23 DIAGNOSIS — Z3042 Encounter for surveillance of injectable contraceptive: Secondary | ICD-10-CM

## 2017-12-23 MED ORDER — MEDROXYPROGESTERONE ACETATE 150 MG/ML IM SUSP
150.0000 mg | Freq: Once | INTRAMUSCULAR | Status: AC
  Administered 2017-12-23: 150 mg via INTRAMUSCULAR

## 2017-12-23 NOTE — Progress Notes (Signed)
Presents for DEPO, given in [place], tolerated well.  Next DEPO 11/19-12/3, 2019  Administrations This Visit    medroxyPROGESTERone (DEPO-PROVERA) injection 150 mg    Admin Date 12/23/2017 Action Given Dose 150 mg Route Intramuscular Administered By Tamela Oddi, RMA

## 2018-01-01 ENCOUNTER — Telehealth: Payer: Self-pay | Admitting: Podiatry

## 2018-01-01 NOTE — Telephone Encounter (Signed)
Thanks we will see her.

## 2018-01-01 NOTE — Telephone Encounter (Signed)
Dr. March Rummage states will see pt tomorrow, but continue to current care and ibuprofen alternate with tylenol. Mr. Wynetta Emery states understanding.

## 2018-01-01 NOTE — Telephone Encounter (Signed)
This is [place] uncle, Brandy Galloway. She is still having trouble with her right great toe. She is still having pain with her toe. I wanted to let the doctor know. We are supposed to be there tomorrow anyway and I will let him know then. Telephone number is [phone]. Thank you.

## 2018-01-01 NOTE — Telephone Encounter (Signed)
I asked Brandy Galloway to describe the toe and he says it just hurts. I asked Brandy Galloway if the toe was swollen, red, discolored or if had a cloudy drainage or if pt had a fever. Brandy Galloway states the toe is dark around the nail, and he has been doing the soak, and cream, 600mg  ibuprofen and tylenol in between, but once the toe scab has dried up from the soak and the medicine wear off, pt is crying again with pain. I told Brandy Galloway to continue the soaks and the medication as he has described until seen in office or I call with different instructions, and I would speak with DR. Price.

## 2018-01-02 ENCOUNTER — Ambulatory Visit: Payer: Medicaid Other | Admitting: Podiatry

## 2018-01-02 ENCOUNTER — Ambulatory Visit (INDEPENDENT_AMBULATORY_CARE_PROVIDER_SITE_OTHER): Payer: Self-pay | Admitting: Podiatry

## 2018-01-02 DIAGNOSIS — Z9889 Other specified postprocedural states: Secondary | ICD-10-CM

## 2018-01-02 NOTE — Progress Notes (Signed)
  Subjective:  Patient ID: Brandy Galloway, female    DOB: 1965-12-29,  MRN: [id number]  No chief complaint on file.  52 y.o. female returns for the above complaint. Still taking ibuprofen. States the nail is painful. Soaking and using epsom salt.  Objective:   General AA&O x3. Normal mood and affect.  Vascular Foot warm and well perfused with good capillary refill.  Neurologic Sensation grossly intact.  Dermatologic Nail avulsion site healing well without drainage or erythema. Nail bed with overlying soft crust. Left intact. No signs of local infection.  Orthopedic: No tenderness to palpation of the toe.   Assessment & Plan:  Patient was evaluated and treated and all questions answered.  S/p Ingrown Toenail Excision, rightt -Healing well without issue. -Discussed return precautions. -F/u PRN

## 2018-01-13 ENCOUNTER — Telehealth: Payer: Self-pay | Admitting: Podiatry

## 2018-01-13 NOTE — Telephone Encounter (Signed)
I was calling to see if you have anymore of those little packs of grease you gave me for her toe? I wanted to know if I could come by and get some more or could I go to the drug store, do they have many at the pharmacy? I would like to know. Telephone number is [phone]. Thank you.

## 2018-01-13 NOTE — Telephone Encounter (Signed)
Left message informing pt's caregiver he could pick up Triple Antibiotic Ointment at any first aid area in the pharmacy.

## 2018-03-12 ENCOUNTER — Ambulatory Visit (INDEPENDENT_AMBULATORY_CARE_PROVIDER_SITE_OTHER): Payer: Medicaid Other

## 2018-03-12 DIAGNOSIS — Z3042 Encounter for surveillance of injectable contraceptive: Secondary | ICD-10-CM

## 2018-03-12 MED ORDER — MEDROXYPROGESTERONE ACETATE 150 MG/ML IM SUSP
150.0000 mg | Freq: Once | INTRAMUSCULAR | Status: AC
  Administered 2018-03-12: 150 mg via INTRAMUSCULAR

## 2018-03-12 NOTE — Progress Notes (Signed)
Pt presents for depo inj. Pt is within her window. Inj given in [place]. Pt tolerated well. Next depo due 2/6-2/20

## 2018-03-12 NOTE — Progress Notes (Signed)
Agree with RN documentation and plan of care.  Lambert Mody. Juleen China, DO OB/GYN Fellow

## 2018-05-13 ENCOUNTER — Encounter: Payer: Self-pay | Admitting: Obstetrics and Gynecology

## 2018-05-13 ENCOUNTER — Ambulatory Visit (INDEPENDENT_AMBULATORY_CARE_PROVIDER_SITE_OTHER): Payer: Medicaid Other | Admitting: Obstetrics and Gynecology

## 2018-05-13 VITALS — BP 176/95 | HR 97 | Wt 199.0 lb

## 2018-05-13 DIAGNOSIS — Z1239 Encounter for other screening for malignant neoplasm of breast: Secondary | ICD-10-CM | POA: Diagnosis not present

## 2018-05-13 MED ORDER — IBUPROFEN 800 MG PO TABS: 800.0000 mg | ORAL_TABLET | Freq: Three times a day (TID) | ORAL | 4 refills | Status: DC | PRN

## 2018-05-13 NOTE — Progress Notes (Signed)
53 yp P0 here for the evaluation of right buttock pain. Patient has mental retardation and is accompanied by her care taker. Pain has been present for the past 2 day. She denies any trauma or fall. The pain is localized on the outer aspect of the right gluteal area and is relieved with ibuprofen and warm compresses. No impairment with mobility  Past Medical History:  Diagnosis Date  . Anxiety   . Hypertension   . 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 Mental retardation   . Seizures ([place])    none in 20 years or more   Past Surgical History:  Procedure Laterality Date  . CERVICAL CONE BIOPSY  2009  . CERVICAL CONIZATION W/BX  08/20/2011   Procedure: CONIZATION CERVIX WITH BIOPSY;  Surgeon: Emily Filbert, MD;  Location: [facility];  Service: Gynecology;  Laterality: N/A;  With Endocervical Currettage  . COLPOSCOPY  06/2011  . COLPOSCOPY N/A 05/28/2016   Procedure: COLPOSCOPY;  Surgeon: Mora Bellman, MD;  Location: [facility];  Service: Gynecology;  Laterality: N/A;  . DILATION AND CURETTAGE OF UTERUS N/A 05/28/2016   Procedure: DILATATION AND CURETTAGE;  Surgeon: Mora Bellman, MD;  Location: [facility];  Service: Gynecology;  Laterality: N/A;  . LEEP  2009   Family History  Problem Relation Age of Onset  . Diabetes Father   . Diabetes Mother    Social History   Tobacco Use  . Smoking status: Never Smoker  . Smokeless tobacco: Never Used  Substance Use Topics  . Alcohol use: No  . Drug use: No   ROS See pertinent in HPI  Blood pressure (!) 176/95, pulse 97, weight 199 lb (90.3 kg). GENERAL: Well-developed, well-nourished female in no acute distress.  ABDOMEN: Soft, nontender, nondistended. No organomegaly. BUTTOCK: no visible deformity or bruising. Tenderness to palpation in outer aspect of the right glute without palpable abscess EXTREMITIES: No cyanosis, clubbing, or edema, 2+ distal pulses.  A/P 53 yo here with right buttock pain - Pain appears to be  musculoskeletal in origine - Encourage patient to take ibuprofen and ice/warm compresses - Patient due for pap smear which care taker states is collected by PCP - screening mammogram ordered - RTC prn

## 2018-05-13 NOTE — Progress Notes (Signed)
RGYN pt presents for problem visit today.  Pt c/o pain on the right side of her buttocks Pt uses IBP 600mg  and Icy hot  Pain onset this week pt was in pain crying c/o "butt hurting".  Contraception: Depo  Last BM yesterday.  B/P elevated did not take B/P medicatin today.

## 2018-05-27 ENCOUNTER — Telehealth: Payer: Self-pay

## 2018-05-27 NOTE — Telephone Encounter (Signed)
TC from pt uncle regarding same complaint as last visit. Pt is still complaining of pain in her Rt Buttock. And no relief with medication.  Pt has an appt Feb 11th for Depo injection.

## 2018-05-27 NOTE — Telephone Encounter (Signed)
Pt uncle voiced understanding and they are going to see here PCP this Friday.

## 2018-06-02 ENCOUNTER — Ambulatory Visit (INDEPENDENT_AMBULATORY_CARE_PROVIDER_SITE_OTHER): Payer: Medicaid Other

## 2018-06-02 DIAGNOSIS — Z3042 Encounter for surveillance of injectable contraceptive: Secondary | ICD-10-CM | POA: Diagnosis not present

## 2018-06-02 MED ORDER — MEDROXYPROGESTERONE ACETATE 150 MG/ML IM SUSP
150.0000 mg | INTRAMUSCULAR | Status: DC
  Administered 2018-06-02 – 2020-01-12 (×4): 150 mg via INTRAMUSCULAR

## 2018-06-02 NOTE — Progress Notes (Signed)
Patient ID: Brandy Galloway, female   DOB: 1966/03/20, 53 y.o.   MRN: [id number] I have reviewed the chart and agree with nursing staff's documentation of this patient's encounter.  Emeterio Reeve, MD 06/02/2018 4:27 PM

## 2018-06-02 NOTE — Progress Notes (Signed)
Patient is in the office for depo injection administered in [place] (per pt guardian's request) and pt tolerated well. Patient's uncle states that she has an x-ray coming up soon in regards to her right hip pain. .. Administrations This Visit    medroxyPROGESTERone (DEPO-PROVERA) injection 150 mg    Admin Date 06/02/2018 Action Given Dose 150 mg Route Intramuscular Administered By Hinton Lovely, RN

## 2018-07-01 ENCOUNTER — Ambulatory Visit: Payer: Medicaid Other

## 2018-07-01 ENCOUNTER — Other Ambulatory Visit: Payer: Self-pay

## 2018-07-01 ENCOUNTER — Ambulatory Visit
Admission: RE | Admit: 2018-07-01 | Discharge: 2018-07-01 | Disposition: A | Discharge status: Home / Self Care | Payer: Medicaid Other | Source: Ambulatory Visit | Attending: Obstetrics and Gynecology | Admitting: Obstetrics and Gynecology

## 2018-07-01 DIAGNOSIS — Z1239 Encounter for other screening for malignant neoplasm of breast: Secondary | ICD-10-CM

## 2018-07-06 ENCOUNTER — Other Ambulatory Visit: Payer: Self-pay

## 2018-07-06 ENCOUNTER — Encounter: Payer: Self-pay | Admitting: Podiatry

## 2018-07-06 ENCOUNTER — Ambulatory Visit (INDEPENDENT_AMBULATORY_CARE_PROVIDER_SITE_OTHER): Payer: Medicaid Other | Admitting: Podiatry

## 2018-07-06 DIAGNOSIS — L600 Ingrowing nail: Secondary | ICD-10-CM

## 2018-07-06 DIAGNOSIS — M79674 Pain in right toe(s): Secondary | ICD-10-CM | POA: Diagnosis not present

## 2018-07-06 DIAGNOSIS — M79675 Pain in left toe(s): Secondary | ICD-10-CM | POA: Diagnosis not present

## 2018-07-06 DIAGNOSIS — B351 Tinea unguium: Secondary | ICD-10-CM | POA: Diagnosis not present

## 2018-07-06 NOTE — Patient Instructions (Addendum)
Soaking instructions:  Soak once daily for one week.  1. Place 1/4 cup of epsom salts in a quart of warm tap water.    2.Submerge your foot or feet in the solution and soak for 20 minutes.  This soak should be done once a day.   3.Next, remove your foot or feet from solution, blot dry the affected area. Apply ointment and leave open to air.  IF YOUR SKIN BECOMES IRRITATED WHILE USING THESE INSTRUCTIONS, IT IS OKAY TO SWITCH TO  WHITE VINEGAR AND WATER.  As another alternative soak, you may use antibacterial soap and water.  Monitor for any signs/symptoms of infection. Call the office immediately if any occur or go directly to the emergency room. Call with any questions/concerns.  Ingrown Toenail An ingrown toenail occurs when the corner or sides of a toenail grow into the surrounding skin. This causes discomfort and pain. The big toe is most commonly affected, but any of the toes can be affected. If an ingrown toenail is not treated, it can become infected. What are the causes? This condition may be caused by:  Wearing shoes that are too small or tight.  An injury, such as stubbing your toe or having your toe stepped on.  Improper cutting or care of your toenails.  Having nail or foot abnormalities that were present from birth (congenital abnormalities), such as having a nail that is too big for your toe. What increases the risk? The following factors may make you more likely to develop ingrown toenails:  Age. Nails tend to get thicker with age, so ingrown nails are more common among older people.  Cutting your toenails incorrectly, such as cutting them very short or cutting them unevenly. An ingrown toenail is more likely to get infected if you have:  Diabetes.  Blood flow (circulation) problems. What are the signs or symptoms? Symptoms of an ingrown toenail may include:  Pain, soreness, or tenderness.  Redness.  Swelling.  Hardening of the skin that surrounds the  toenail. Signs that an ingrown toenail may be infected include:  Fluid or pus.  Symptoms that get worse instead of better. How is this diagnosed? An ingrown toenail may be diagnosed based on your medical history, your symptoms, and a physical exam. If you have fluid or blood coming from your toenail, a sample may be collected to test for the specific type of bacteria that is causing the infection. How is this treated? Treatment depends on how severe your ingrown toenail is. You may be able to care for your toenail at home.  If you have an infection, you may be prescribed antibiotic medicines.  If you have fluid or pus draining from your toenail, your health care provider may drain it.  If you have trouble walking, you may be given crutches to use.  If you have a severe or infected ingrown toenail, you may need a procedure to remove part or all of the nail. Follow these instructions at home: Foot care   Do not pick at your toenail or try to remove it yourself.  Soak your foot in warm, soapy water. Do this for 20 minutes, 3 times a day, or as often as told by your health care provider. This helps to keep your toe clean and keep your skin soft.  Wear shoes that fit well and are not too tight. Your health care provider may recommend that you wear open-toed shoes while you heal.  Trim your toenails regularly and carefully. Cut your  toenails straight across to prevent injury to the skin at the corners of the toenail. Do not cut your nails in a curved shape.  Keep your feet clean and dry to help prevent infection. Medicines  Take over-the-counter and prescription medicines only as told by your health care provider.  If you were prescribed an antibiotic, take it as told by your health care provider. Do not stop taking the antibiotic even if you start to feel better. Activity  Return to your normal activities as told by your health care provider. Ask your health care provider what  activities are safe for you.  Avoid activities that cause pain. General instructions  If your health care provider told you to use crutches to help you move around, use them as instructed.  Keep all follow-up visits as told by your health care provider. This is important. Contact a health care provider if:  You have more redness, swelling, pain, or other symptoms that do not improve with treatment.  You have fluid, blood, or pus coming from your toenail. Get help right away if:  You have a red streak on your skin that starts at your foot and spreads up your leg.  You have a fever. Summary  An ingrown toenail occurs when the corner or sides of a toenail grow into the surrounding skin. This causes discomfort and pain. The big toe is most commonly affected, but any of the toes can be affected.  If an ingrown toenail is not treated, it can become infected.  Fluid or pus draining from your toenail is a sign of infection. Your health care provider may need to drain it. You may be given antibiotics to treat the infection.  Trimming your toenails regularly and properly can help you prevent an ingrown toenail. This information is not intended to replace advice given to you by your health care provider. Make sure you discuss any questions you have with your health care provider. Document Released: 04/05/2000 Document Revised: 12/25/2016 Document Reviewed: 12/25/2016 Elsevier Interactive Patient Education  2019 [employer].

## 2018-07-15 NOTE — Progress Notes (Signed)
Subjective: NONA GRACEY presents today for chronic painful ingrown toenail right great toe. She is accompanied by her uncle who says he noticed drainage coming from toe. Uncle has been applying Neosporin Cream, but she continues to complain of pain.  Nolene Ebbs, MD is her PCP.    No Known Allergies  Objective:  Vascular Examination: Capillary refill time immediate x 10 digits  Dorsalis pedis and Posterior tibial pulses palpable b/l  Digital hair sparse  x 10 digits  Skin temperature gradient WNL b/l  Dermatological Examination: Skin with normal turgor, texture and tone b/l  Incurvated nailplate right great toe medial border  with tenderness to palpation. +Nail border hypertrophy. +Pinpont purulence. No erythema, no edema.  Remaining toenails left great toe and  2-5 b/l discolored, thick, dystrophic with subungual debris and pain with palpation to nailbeds due to thickness of nails.  Musculoskeletal: Muscle strength 5/5 to all LE muscle groups  No gross bony deformities b/l.  No pain, crepitus or joint limitation noted with ROM.   Neurological: Sensation intact with 10 gram monofilament.  Vibratory sensation intact.  Assessment: Painful onychomycosis toenails 1-5 b/l   Plan: 1. Offending nail border right great toe debrided via slantback procedure. Border curretaged. Alcohol wash and triple antibiotic ointment and bandaid performed. Uncle given written instructions on epsom salt/warm water soaks (start soaks tomorrow) to perform for one week. Call office should symptoms worsen. Encouraged uncle to keep routine appointments so we can avoid recurrence of condition. Should she continue to have this problem, matrixectomy of right great toe is next option. Uncle related understanding. 2. Toenails left great toe and  2-5 b/l were debrided in length and girth without iatrogenic bleeding. 3. Patient to continue soft, supportive shoe gear daily. 4. Patient to report any  pedal injuries to medical professional immediately. 5. Follow up 3 months.  6. Patient/POA to call should there be a concern in the interim.

## 2018-07-21 ENCOUNTER — Telehealth: Payer: Self-pay | Admitting: *Deleted

## 2018-07-21 NOTE — Telephone Encounter (Signed)
Cleone Slim - Dr. Elyn Peers office staff request copy of the clinicals for this referred pt.

## 2018-08-19 ENCOUNTER — Telehealth: Payer: Self-pay | Admitting: *Deleted

## 2018-08-19 NOTE — Telephone Encounter (Signed)
Spoke with caregiver today.  He states that pt is still having pain in right buttocks.  Pt is taking Ibuprofen and Tylenol with little relief.  Uncle states that she will cry due to pain and meds will ease pain some.   Uncle states that they have been to PCP and have had xrays which seem negative for problems.  Pt uncle feels that she may need to change medication and/or have something a little stronger to get better relief.  Made aware I would message last provider for recommendations. Pt has appt tomorrow for Depo injection.     Please advise.

## 2018-08-20 ENCOUNTER — Ambulatory Visit (INDEPENDENT_AMBULATORY_CARE_PROVIDER_SITE_OTHER): Payer: Medicaid Other

## 2018-08-20 ENCOUNTER — Other Ambulatory Visit: Payer: Self-pay

## 2018-08-20 VITALS — BP 180/96 | HR 98 | Wt 198.5 lb

## 2018-08-20 DIAGNOSIS — Z3042 Encounter for surveillance of injectable contraceptive: Secondary | ICD-10-CM | POA: Diagnosis not present

## 2018-08-20 DIAGNOSIS — N939 Abnormal uterine and vaginal bleeding, unspecified: Secondary | ICD-10-CM

## 2018-08-20 MED ORDER — MEDROXYPROGESTERONE ACETATE 150 MG/ML IM SUSP
150.0000 mg | Freq: Once | INTRAMUSCULAR | Status: AC
  Administered 2018-08-20: 150 mg via INTRAMUSCULAR

## 2018-08-20 MED ORDER — MEDROXYPROGESTERONE ACETATE 150 MG/ML IM SUSP: 150.0000 mg | INTRAMUSCULAR | 3 refills | Status: DC

## 2018-08-20 NOTE — Telephone Encounter (Signed)
Made aware at time of appt on 08/20/18.

## 2018-08-20 NOTE — Progress Notes (Signed)
Presented for DEPO, given in [place] as requested by her Uncle, tolerated well.  Next DEPO July 16-30/2020  Administrations This Visit    medroxyPROGESTERone (DEPO-PROVERA) injection 150 mg    Admin Date 08/20/2018 Action Given Dose 150 mg Route Intramuscular Administered By Tamela Oddi, RMA

## 2018-08-28 ENCOUNTER — Other Ambulatory Visit: Payer: Self-pay | Admitting: Orthopedic Surgery

## 2018-08-28 DIAGNOSIS — M545 Low back pain, unspecified: Secondary | ICD-10-CM

## 2018-08-28 DIAGNOSIS — M7918 Myalgia, other site: Secondary | ICD-10-CM

## 2018-09-07 ENCOUNTER — Other Ambulatory Visit: Payer: Medicaid Other

## 2018-09-10 ENCOUNTER — Ambulatory Visit
Admission: RE | Admit: 2018-09-10 | Discharge: 2018-09-10 | Disposition: A | Discharge status: Home / Self Care | Payer: Medicaid Other | Source: Ambulatory Visit | Attending: Orthopedic Surgery | Admitting: Orthopedic Surgery

## 2018-09-10 ENCOUNTER — Other Ambulatory Visit: Payer: Self-pay

## 2018-09-10 DIAGNOSIS — M7918 Myalgia, other site: Secondary | ICD-10-CM

## 2018-09-10 DIAGNOSIS — M545 Low back pain, unspecified: Secondary | ICD-10-CM

## 2018-09-15 ENCOUNTER — Other Ambulatory Visit: Payer: Self-pay | Admitting: Orthopedic Surgery

## 2018-09-15 DIAGNOSIS — M545 Low back pain, unspecified: Secondary | ICD-10-CM

## 2018-09-15 DIAGNOSIS — M7918 Myalgia, other site: Secondary | ICD-10-CM

## 2018-10-01 ENCOUNTER — Other Ambulatory Visit: Payer: Medicaid Other

## 2018-10-06 ENCOUNTER — Ambulatory Visit: Payer: Medicaid Other | Admitting: Podiatry

## 2018-10-07 DIAGNOSIS — M545 Low back pain, unspecified: Secondary | ICD-10-CM | POA: Diagnosis present

## 2018-11-09 ENCOUNTER — Ambulatory Visit (INDEPENDENT_AMBULATORY_CARE_PROVIDER_SITE_OTHER): Payer: Medicaid Other

## 2018-11-09 ENCOUNTER — Other Ambulatory Visit: Payer: Self-pay

## 2018-11-09 DIAGNOSIS — Z3042 Encounter for surveillance of injectable contraceptive: Secondary | ICD-10-CM | POA: Diagnosis not present

## 2018-11-09 MED ORDER — MEDROXYPROGESTERONE ACETATE 150 MG/ML IM SUSP
150.0000 mg | Freq: Once | INTRAMUSCULAR | Status: AC
  Administered 2018-11-09: 10:00:00 150 mg via INTRAMUSCULAR

## 2018-11-09 NOTE — Progress Notes (Signed)
Pt is here for depo injection, she is on time and within window. Depo injection given in LUOQ without difficulty. Next injection due between 10/5-10/19.

## 2018-12-09 DIAGNOSIS — M5416 Radiculopathy, lumbar region: Secondary | ICD-10-CM | POA: Insufficient documentation

## 2018-12-09 DIAGNOSIS — M48061 Spinal stenosis, lumbar region without neurogenic claudication: Secondary | ICD-10-CM | POA: Insufficient documentation

## 2018-12-09 DIAGNOSIS — M47816 Spondylosis without myelopathy or radiculopathy, lumbar region: Secondary | ICD-10-CM | POA: Insufficient documentation

## 2018-12-10 ENCOUNTER — Encounter: Payer: Self-pay | Admitting: Podiatry

## 2018-12-10 ENCOUNTER — Ambulatory Visit (INDEPENDENT_AMBULATORY_CARE_PROVIDER_SITE_OTHER): Payer: Medicaid Other | Admitting: Podiatry

## 2018-12-10 ENCOUNTER — Telehealth: Payer: Self-pay | Admitting: Podiatry

## 2018-12-10 ENCOUNTER — Telehealth: Payer: Self-pay | Admitting: *Deleted

## 2018-12-10 ENCOUNTER — Other Ambulatory Visit: Payer: Self-pay

## 2018-12-10 VITALS — Temp 97.5°F

## 2018-12-10 DIAGNOSIS — L600 Ingrowing nail: Secondary | ICD-10-CM

## 2018-12-10 MED ORDER — NEOMYCIN-POLYMYXIN-HC 3.5-10000-1 OT SOLN: OTIC | 0 refills | Status: DC

## 2018-12-10 NOTE — Telephone Encounter (Signed)
Pt's uncle called and states pt needs pain medication.

## 2018-12-10 NOTE — Telephone Encounter (Signed)
Pt's uncle requested an Rx for pain be sent to [facility] #[id number].

## 2018-12-10 NOTE — Patient Instructions (Signed)

## 2018-12-10 NOTE — Progress Notes (Signed)
Subjective:   Patient ID: Leanord Asal, female   DOB: 53 y.o.   MRN: [id number]   HPI Patient presents with caregiver with chronic ingrown toenail of the right big toe stating both borders have been bothering her   ROS      Objective:  Physical Exam  Neurovascular status intact with incurvation of the right hallux medial lateral borders with good digital perfusion noted and no drainage noted associated with the nailbed     Assessment:  Chronic ingrown toenail deformity right hallux medial lateral border     Plan:  Use with her and her caregiver ingrown toenail excision of the corners and explained procedure risk and patient wants them performed.  Understands risk at this time and today I infiltrated the right hallux 60 mg like Marcaine mixture I remove the medial lateral borders exposed matrix and applied phenol 3 applications 30 seconds followed by alcohol lavage sterile dressing and gave instructions on soaks

## 2018-12-10 NOTE — Telephone Encounter (Signed)
I spoke with pt's uncle and he states he gave her Tylenol PM and she had some pain medication for her back and it helped. I told Brandy Galloway that routinely tylenol or ibuprofen were used for the discomfort after a toenail procedure. Brandy Galloway states understanding.

## 2018-12-11 ENCOUNTER — Telehealth: Payer: Self-pay | Admitting: *Deleted

## 2018-12-11 NOTE — Telephone Encounter (Signed)
Returned phone call to the uncle of the patient at [phone] (Cell #) regarding the removal of his niece's nail yesterday, December 10, 2018 with Dr. Paulla Dolly.  The uncle called the other day requesting pain medication for their niece.  After talking to the uncle, patient has not yet soaked their toe in epsom salt. I recommended once I got off the phone with him, to have his niece soak their toe in epsom salt for 20 minutes, rinse off with water, then put 1-2 drops of neomycin drops on the nail and then put a band aid on top of that. I also told him he needs to do this twice a day for two weeks. If there is still drainage from the toe after two weeks, to continue to soak the toe until they do not see any drainage.  Pt's uncle stated, "My niece is doing better today. She is taking Tylenol PM and that is helping".  I told the uncle that she has had the bandage on for over 24 hours and needs to get that taken off today.  I told the uncle that if she was still in excruciating pain by Monday, to call our office.  Patient's uncle stated he understood.

## 2018-12-22 ENCOUNTER — Other Ambulatory Visit: Payer: Self-pay | Admitting: Neurosurgery

## 2018-12-22 DIAGNOSIS — M48061 Spinal stenosis, lumbar region without neurogenic claudication: Secondary | ICD-10-CM

## 2019-01-09 ENCOUNTER — Ambulatory Visit
Admission: RE | Admit: 2019-01-09 | Discharge: 2019-01-09 | Disposition: A | Discharge status: Home / Self Care | Payer: Medicaid Other | Source: Ambulatory Visit | Attending: Neurosurgery | Admitting: Neurosurgery

## 2019-01-09 ENCOUNTER — Other Ambulatory Visit: Payer: Self-pay

## 2019-01-09 DIAGNOSIS — M48061 Spinal stenosis, lumbar region without neurogenic claudication: Secondary | ICD-10-CM

## 2019-01-18 ENCOUNTER — Ambulatory Visit
Admission: RE | Admit: 2019-01-18 | Discharge: 2019-01-18 | Disposition: A | Discharge status: Home / Self Care | Payer: Medicaid Other | Source: Ambulatory Visit | Attending: Neurosurgery | Admitting: Neurosurgery

## 2019-01-18 ENCOUNTER — Other Ambulatory Visit: Payer: Self-pay | Admitting: Neurosurgery

## 2019-01-18 ENCOUNTER — Ambulatory Visit: Admission: RE | Admit: 2019-01-18 | Payer: Medicaid Other | Source: Ambulatory Visit

## 2019-01-18 ENCOUNTER — Inpatient Hospital Stay: Admission: RE | Admit: 2019-01-18 | Payer: Medicaid Other | Source: Ambulatory Visit

## 2019-01-18 DIAGNOSIS — M48061 Spinal stenosis, lumbar region without neurogenic claudication: Secondary | ICD-10-CM

## 2019-01-28 ENCOUNTER — Ambulatory Visit: Payer: Medicaid Other

## 2019-01-29 ENCOUNTER — Ambulatory Visit (INDEPENDENT_AMBULATORY_CARE_PROVIDER_SITE_OTHER): Payer: Medicaid Other

## 2019-01-29 ENCOUNTER — Other Ambulatory Visit: Payer: Self-pay

## 2019-01-29 DIAGNOSIS — Z3042 Encounter for surveillance of injectable contraceptive: Secondary | ICD-10-CM

## 2019-01-29 MED ORDER — MEDROXYPROGESTERONE ACETATE 150 MG/ML IM SUSP
150.0000 mg | Freq: Once | INTRAMUSCULAR | Status: AC
  Administered 2019-01-29: 150 mg via INTRAMUSCULAR

## 2019-01-29 NOTE — Progress Notes (Signed)
Pt is here for depo injection, she is on time and within her window. Injection given in LUOQ without difficulty. Next injection due 12/25-1/08/21. Pt and pt's caretaker verbalizes understanding.

## 2019-02-01 ENCOUNTER — Ambulatory Visit: Payer: Medicaid Other

## 2019-02-02 ENCOUNTER — Other Ambulatory Visit: Payer: Self-pay | Admitting: Neurosurgery

## 2019-02-15 ENCOUNTER — Other Ambulatory Visit (HOSPITAL_COMMUNITY): Payer: Medicaid Other

## 2019-02-23 ENCOUNTER — Inpatient Hospital Stay (HOSPITAL_COMMUNITY): Admission: RE | Admit: 2019-02-23 | Payer: Medicaid Other | Source: Ambulatory Visit

## 2019-02-24 ENCOUNTER — Other Ambulatory Visit (HOSPITAL_COMMUNITY)
Admission: RE | Admit: 2019-02-24 | Discharge: 2019-02-24 | Disposition: A | Discharge status: Home / Self Care | Payer: Medicaid Other | Source: Ambulatory Visit | Attending: Neurosurgery | Admitting: Neurosurgery

## 2019-02-24 DIAGNOSIS — Z01812 Encounter for preprocedural laboratory examination: Secondary | ICD-10-CM | POA: Diagnosis not present

## 2019-02-24 DIAGNOSIS — Z20828 Contact with and (suspected) exposure to other viral communicable diseases: Secondary | ICD-10-CM | POA: Insufficient documentation

## 2019-02-24 LAB — SARS CORONAVIRUS 2 (TAT 6-24 HRS): SARS Coronavirus 2: NEGATIVE

## 2019-02-26 ENCOUNTER — Ambulatory Visit (HOSPITAL_COMMUNITY): Admission: RE | Admit: 2019-02-26 | Payer: Medicaid Other | Source: Home / Self Care | Admitting: Neurosurgery

## 2019-02-26 ENCOUNTER — Encounter (HOSPITAL_COMMUNITY): Admission: RE | Payer: Self-pay | Source: Home / Self Care

## 2019-02-26 SURGERY — LUMBAR LAMINECTOMY/DECOMPRESSION MICRODISCECTOMY 2 LEVELS
Anesthesia: General

## 2019-03-16 ENCOUNTER — Other Ambulatory Visit: Payer: Self-pay | Admitting: Neurosurgery

## 2019-03-26 NOTE — Pre-Procedure Instructions (Signed)
[facility] [id number] [place] [address] Phone: [phone] Fax: [phone]     Your procedure is scheduled on Wednesday, December 9th, from 09:15 AM to 11:49 AM..  Report to [facility] Main Entrance "A" at 07:15 A.M., and check in at the Admitting office.  Call this number if you have problems the morning of surgery:  [phone].  Call [phone] if you have any questions prior to your surgery date Monday-Friday 8am-4pm.    Remember:  Do not eat or drink after midnight the night before your surgery.    Take these medicines the morning of surgery with A SIP OF WATER: amLODipine (NORVASC)  IF NEEDED: acetaminophen (TYLENOL)  HYDROcodone-acetaminophen (NORCO/VICODIN)   As of today, STOP taking any Aspirin (unless otherwise instructed by your surgeon), NSAIDs such as meloxicam (MOBIC), Aleve, Naproxen, Ibuprofen, Motrin, Advil, Goody's, BC's, all herbal medications, fish oil, and all vitamins.    The Morning of Surgery  Do not wear jewelry, make-up or nail polish.  Do not wear lotions, powders, perfumes, or deodorant.  Do not shave 48 hours prior to surgery.    Do not bring valuables to the hospital.  [facility] is not responsible for any belongings or valuables.  If you are a smoker, DO NOT Smoke 24 hours prior to surgery.  If you wear a CPAP at night please bring your mask, tubing, and machine the morning of surgery .  Remember that you must have someone to transport you home after your surgery, and remain with you for 24 hours if you are discharged the same day.   Please bring cases for contacts, glasses, hearing aids, dentures or bridgework because it cannot be worn into surgery.    Leave your suitcase in the car.  After surgery it may be brought to your room.  For patients admitted to the hospital, discharge time will be determined by your treatment  team.  Patients discharged the day of surgery will not be allowed to drive home.    Special instructions:   [facility]- Preparing For Surgery  Before surgery, you can play an important role. Because skin is not sterile, your skin needs to be as free of germs as possible. You can reduce the number of germs on your skin by washing with CHG (chlorahexidine gluconate) Soap before surgery.  CHG is an antiseptic cleaner which kills germs and bonds with the skin to continue killing germs even after washing.    Oral Hygiene is also important to reduce your risk of infection.  Remember - BRUSH YOUR TEETH THE MORNING OF SURGERY WITH YOUR REGULAR TOOTHPASTE  Please do not use if you have an allergy to CHG or antibacterial soaps. If your skin becomes reddened/irritated stop using the CHG.  Do not shave (including legs and underarms) for at least 48 hours prior to first CHG shower. It is OK to shave your face.  Please follow these instructions carefully.   1. Shower the NIGHT BEFORE SURGERY and the MORNING OF SURGERY with CHG Soap.   2. If you chose to wash your hair, wash your hair first as usual with your normal shampoo.  3. After you shampoo, rinse your hair and body thoroughly to remove the shampoo.  4. Use CHG as you would any other liquid soap. You can apply CHG directly to the skin and wash gently with a scrungie or a clean washcloth.  5. Apply the CHG Soap to your body ONLY FROM THE NECK DOWN.  Do not use on open wounds or open sores. Avoid contact with your eyes, ears, mouth and genitals (private parts). Wash Face and genitals (private parts)  with your normal soap.   6. Wash thoroughly, paying special attention to the area where your surgery will be performed.  7. Thoroughly rinse your body with warm water from the neck down.  8. DO NOT shower/wash with your normal soap after using and rinsing off the CHG Soap.  9. Pat yourself dry with a CLEAN TOWEL.  10. Wear CLEAN PAJAMAS to bed  the night before surgery, wear comfortable clothes the morning of surgery.  11. Place CLEAN SHEETS on your bed the night of your first shower and DO NOT SLEEP WITH PETS.    Day of Surgery:  Remember to brush your teeth WITH YOUR REGULAR TOOTHPASTE. Please shower the morning of surgery with the CHG soap. Do not apply any deodorants/lotions. Please wear clean clothes to the hospital/surgery center.      Please read over the following fact sheets that you were given.

## 2019-03-29 ENCOUNTER — Other Ambulatory Visit: Payer: Self-pay

## 2019-03-29 ENCOUNTER — Encounter (HOSPITAL_COMMUNITY)
Admission: RE | Admit: 2019-03-29 | Discharge: 2019-03-29 | Disposition: A | Discharge status: Home / Self Care | Payer: Medicaid Other | Source: Ambulatory Visit | Attending: Neurosurgery | Admitting: Neurosurgery

## 2019-03-29 ENCOUNTER — Other Ambulatory Visit (HOSPITAL_COMMUNITY)
Admission: RE | Admit: 2019-03-29 | Discharge: 2019-03-29 | Disposition: A | Discharge status: Home / Self Care | Payer: Medicaid Other | Source: Ambulatory Visit | Attending: Neurosurgery | Admitting: Neurosurgery

## 2019-03-29 ENCOUNTER — Encounter (HOSPITAL_COMMUNITY): Payer: Self-pay

## 2019-03-29 DIAGNOSIS — I10 Essential (primary) hypertension: Secondary | ICD-10-CM | POA: Insufficient documentation

## 2019-03-29 DIAGNOSIS — Z01818 Encounter for other preprocedural examination: Secondary | ICD-10-CM | POA: Diagnosis not present

## 2019-03-29 DIAGNOSIS — I498 Other specified cardiac arrhythmias: Secondary | ICD-10-CM | POA: Diagnosis not present

## 2019-03-29 LAB — BASIC METABOLIC PANEL
Anion gap: 12 (ref 5–15)
BUN: 12 mg/dL (ref 6–20)
CO2: 24 mmol/L (ref 22–32)
Calcium: 10.1 mg/dL (ref 8.9–10.3)
Chloride: 103 mmol/L (ref 98–111)
Creatinine, Ser: 1.03 mg/dL — ABNORMAL HIGH (ref 0.44–1.00)
GFR calc Af Amer: >60 mL/min (ref >60)
GFR calc non Af Amer: >60 mL/min (ref >60)
Glucose, Bld: 146 mg/dL — ABNORMAL HIGH (ref 70–99)
Potassium: 3.4 mmol/L — ABNORMAL LOW (ref 3.5–5.1)
Sodium: 139 mmol/L (ref 135–145)

## 2019-03-29 LAB — CBC
HCT: 41.6 % (ref 36.0–46.0)
Hemoglobin: 13.7 g/dL (ref 12.0–15.0)
MCH: 29.3 pg (ref 26.0–34.0)
MCHC: 32.9 g/dL (ref 30.0–36.0)
MCV: 88.9 fL (ref 80.0–100.0)
Platelets: 328 10*3/uL (ref 150–400)
RBC: 4.68 MIL/uL (ref 3.87–5.11)
RDW: 13.1 % (ref 11.5–15.5)
WBC: 7.7 10*3/uL (ref 4.0–10.5)
nRBC: 0 % (ref 0.0–0.2)

## 2019-03-29 LAB — SURGICAL PCR SCREEN
MRSA, PCR: NEGATIVE
Staphylococcus aureus: NEGATIVE

## 2019-03-29 LAB — SARS CORONAVIRUS 2 (TAT 6-24 HRS): SARS Coronavirus 2: NEGATIVE

## 2019-03-29 NOTE — Progress Notes (Signed)
PCP - Nolene Ebbs, MD Cardiologist - denies  Chest x-ray - N/A EKG - 03/29/19  Stress Test - denies ECHO - denies Cardiac Cath - denies  Blood Thinner Instructions: N/A Aspirin Instructions: N/A  COVID TEST- scheduled after PAT today, 03/29/19; aware of need for self-quarantine following COVID testing  Anesthesia review: No  Patient denies shortness of breath, fever, cough and chest pain at PAT appointment  All instructions explained to the patient, with a verbal understanding of the material. Patient agrees to go over the instructions while at home for a better understanding. Patient also instructed to self quarantine after being tested for COVID-19. The opportunity to ask questions was provided.

## 2019-03-31 ENCOUNTER — Ambulatory Visit (HOSPITAL_COMMUNITY)
Admission: RE | Admit: 2019-03-31 | Discharge: 2019-04-01 | Disposition: A | Discharge status: Home / Self Care | Payer: Medicaid Other | Source: Ambulatory Visit | Attending: Neurosurgery | Admitting: Neurosurgery

## 2019-03-31 ENCOUNTER — Other Ambulatory Visit: Payer: Self-pay

## 2019-03-31 ENCOUNTER — Encounter (HOSPITAL_COMMUNITY): Payer: Self-pay | Admitting: *Deleted

## 2019-03-31 ENCOUNTER — Ambulatory Visit (HOSPITAL_COMMUNITY): Payer: Medicaid Other | Admitting: Certified Registered"

## 2019-03-31 ENCOUNTER — Encounter (HOSPITAL_COMMUNITY)
Admission: RE | Disposition: A | Discharge status: Home / Self Care | Payer: Self-pay | Source: Ambulatory Visit | Attending: Neurosurgery

## 2019-03-31 ENCOUNTER — Ambulatory Visit (HOSPITAL_COMMUNITY): Payer: Medicaid Other

## 2019-03-31 DIAGNOSIS — M48062 Spinal stenosis, lumbar region with neurogenic claudication: Secondary | ICD-10-CM | POA: Diagnosis present

## 2019-03-31 DIAGNOSIS — Z419 Encounter for procedure for purposes other than remedying health state, unspecified: Secondary | ICD-10-CM

## 2019-03-31 DIAGNOSIS — M48061 Spinal stenosis, lumbar region without neurogenic claudication: Secondary | ICD-10-CM | POA: Insufficient documentation

## 2019-03-31 DIAGNOSIS — F79 Unspecified intellectual disabilities: Secondary | ICD-10-CM | POA: Diagnosis not present

## 2019-03-31 DIAGNOSIS — I10 Essential (primary) hypertension: Secondary | ICD-10-CM | POA: Diagnosis not present

## 2019-03-31 HISTORY — PX: LUMBAR LAMINECTOMY/DECOMPRESSION MICRODISCECTOMY: SHX5026

## 2019-03-31 LAB — POCT PREGNANCY, URINE: Preg Test, Ur: NEGATIVE

## 2019-03-31 SURGERY — LUMBAR LAMINECTOMY/DECOMPRESSION MICRODISCECTOMY 2 LEVELS
Anesthesia: General

## 2019-03-31 MED ORDER — MENTHOL 3 MG MT LOZG: 1.0000 | LOZENGE | OROMUCOSAL | Status: DC | PRN

## 2019-03-31 MED ORDER — 0.9 % SODIUM CHLORIDE (POUR BTL) OPTIME
TOPICAL | Status: DC | PRN
  Administered 2019-03-31: 1000 mL

## 2019-03-31 MED ORDER — AMLODIPINE BESYLATE 5 MG PO TABS
10.0000 mg | ORAL_TABLET | Freq: Every day | ORAL | Status: DC
  Administered 2019-03-31 – 2019-04-01 (×2): 10 mg via ORAL
  Filled 2019-03-31 (×2): qty 2

## 2019-03-31 MED ORDER — THROMBIN 20000 UNITS EX SOLR
CUTANEOUS | Status: AC
  Filled 2019-03-31: qty 20000

## 2019-03-31 MED ORDER — LIDOCAINE 2% (20 MG/ML) 5 ML SYRINGE
INTRAMUSCULAR | Status: DC | PRN
  Administered 2019-03-31: 40 mg via INTRAVENOUS

## 2019-03-31 MED ORDER — THROMBIN 20000 UNITS EX SOLR
OROMUCOSAL | Status: DC | PRN
  Administered 2019-03-31: 20 mL via TOPICAL

## 2019-03-31 MED ORDER — BUPIVACAINE HCL (PF) 0.5 % IJ SOLN
INTRAMUSCULAR | Status: DC | PRN
  Administered 2019-03-31: 30 mL

## 2019-03-31 MED ORDER — CHLORHEXIDINE GLUCONATE CLOTH 2 % EX PADS: 6.0000 | MEDICATED_PAD | Freq: Once | CUTANEOUS | Status: DC

## 2019-03-31 MED ORDER — SODIUM CHLORIDE 0.9% FLUSH
3.0000 mL | Freq: Two times a day (BID) | INTRAVENOUS | Status: DC
  Administered 2019-03-31: 3 mL via INTRAVENOUS

## 2019-03-31 MED ORDER — FENTANYL CITRATE (PF) 100 MCG/2ML IJ SOLN
INTRAMUSCULAR | Status: DC | PRN
  Administered 2019-03-31: 150 ug via INTRAVENOUS
  Administered 2019-03-31 (×2): 50 ug via INTRAVENOUS

## 2019-03-31 MED ORDER — LIDOCAINE-EPINEPHRINE 0.5 %-1:200000 IJ SOLN
INTRAMUSCULAR | Status: DC | PRN
  Administered 2019-03-31: 10 mL

## 2019-03-31 MED ORDER — MIDAZOLAM HCL 2 MG/2ML IJ SOLN
INTRAMUSCULAR | Status: DC | PRN
  Administered 2019-03-31: 1 mg via INTRAVENOUS

## 2019-03-31 MED ORDER — DOCUSATE SODIUM 100 MG PO CAPS
100.0000 mg | ORAL_CAPSULE | Freq: Two times a day (BID) | ORAL | Status: DC
  Administered 2019-03-31 – 2019-04-01 (×2): 100 mg via ORAL
  Filled 2019-03-31: qty 1

## 2019-03-31 MED ORDER — BISACODYL 5 MG PO TBEC: 5.0000 mg | DELAYED_RELEASE_TABLET | Freq: Every day | ORAL | Status: DC | PRN

## 2019-03-31 MED ORDER — LIDOCAINE-EPINEPHRINE 0.5 %-1:200000 IJ SOLN
INTRAMUSCULAR | Status: AC
  Filled 2019-03-31: qty 1

## 2019-03-31 MED ORDER — SUGAMMADEX SODIUM 200 MG/2ML IV SOLN
INTRAVENOUS | Status: DC | PRN
  Administered 2019-03-31: 200 mg via INTRAVENOUS

## 2019-03-31 MED ORDER — ONDANSETRON HCL 4 MG/2ML IJ SOLN
4.0000 mg | Freq: Four times a day (QID) | INTRAMUSCULAR | Status: AC | PRN
  Administered 2019-03-31: 4 mg via INTRAVENOUS

## 2019-03-31 MED ORDER — ROCURONIUM BROMIDE 10 MG/ML (PF) SYRINGE
PREFILLED_SYRINGE | INTRAVENOUS | Status: AC
  Filled 2019-03-31: qty 10

## 2019-03-31 MED ORDER — FENTANYL CITRATE (PF) 250 MCG/5ML IJ SOLN
INTRAMUSCULAR | Status: AC
  Filled 2019-03-31: qty 5

## 2019-03-31 MED ORDER — POTASSIUM CHLORIDE IN NACL 20-0.9 MEQ/L-% IV SOLN: INTRAVENOUS | Status: DC

## 2019-03-31 MED ORDER — FENTANYL CITRATE (PF) 100 MCG/2ML IJ SOLN
25.0000 ug | INTRAMUSCULAR | Status: DC | PRN
  Administered 2019-03-31 (×3): 50 ug via INTRAVENOUS

## 2019-03-31 MED ORDER — PHENOL 1.4 % MT LIQD: 1.0000 | OROMUCOSAL | Status: DC | PRN

## 2019-03-31 MED ORDER — LIDOCAINE 2% (20 MG/ML) 5 ML SYRINGE
INTRAMUSCULAR | Status: AC
  Filled 2019-03-31: qty 5

## 2019-03-31 MED ORDER — DEXAMETHASONE SODIUM PHOSPHATE 10 MG/ML IJ SOLN
INTRAMUSCULAR | Status: DC | PRN
  Administered 2019-03-31: 10 mg via INTRAVENOUS

## 2019-03-31 MED ORDER — DIAZEPAM 5 MG PO TABS
5.0000 mg | ORAL_TABLET | Freq: Four times a day (QID) | ORAL | Status: DC | PRN
  Administered 2019-03-31 – 2019-04-01 (×2): 5 mg via ORAL
  Filled 2019-03-31 (×2): qty 1

## 2019-03-31 MED ORDER — OXYCODONE HCL 5 MG PO TABS
ORAL_TABLET | ORAL | Status: AC
  Filled 2019-03-31: qty 1

## 2019-03-31 MED ORDER — HYDROCHLOROTHIAZIDE 12.5 MG PO CAPS
12.5000 mg | ORAL_CAPSULE | Freq: Every day | ORAL | Status: DC
  Administered 2019-03-31 – 2019-04-01 (×2): 12.5 mg via ORAL
  Filled 2019-03-31 (×2): qty 1

## 2019-03-31 MED ORDER — MIDAZOLAM HCL 2 MG/2ML IJ SOLN
INTRAMUSCULAR | Status: AC
  Filled 2019-03-31: qty 2

## 2019-03-31 MED ORDER — ONDANSETRON HCL 4 MG/2ML IJ SOLN
INTRAMUSCULAR | Status: DC | PRN
  Administered 2019-03-31: 4 mg via INTRAVENOUS

## 2019-03-31 MED ORDER — ROCURONIUM BROMIDE 10 MG/ML (PF) SYRINGE
PREFILLED_SYRINGE | INTRAVENOUS | Status: DC | PRN
  Administered 2019-03-31: 60 mg via INTRAVENOUS
  Administered 2019-03-31: 20 mg via INTRAVENOUS
  Administered 2019-03-31: 10 mg via INTRAVENOUS

## 2019-03-31 MED ORDER — OXYCODONE HCL 5 MG/5ML PO SOLN: 5.0000 mg | Freq: Once | ORAL | Status: DC | PRN

## 2019-03-31 MED ORDER — ONDANSETRON HCL 4 MG/2ML IJ SOLN: 4.0000 mg | Freq: Four times a day (QID) | INTRAMUSCULAR | Status: DC | PRN

## 2019-03-31 MED ORDER — HYDROXYZINE HCL 25 MG PO TABS: 25.0000 mg | ORAL_TABLET | Freq: Three times a day (TID) | ORAL | Status: DC | PRN

## 2019-03-31 MED ORDER — CALCIUM GLUCONATE 500 MG PO TABS: 1.0000 | ORAL_TABLET | Freq: Every day | ORAL | Status: DC

## 2019-03-31 MED ORDER — PROPOFOL 10 MG/ML IV BOLUS
INTRAVENOUS | Status: DC | PRN
  Administered 2019-03-31: 150 mg via INTRAVENOUS
  Administered 2019-03-31: 50 mg via INTRAVENOUS

## 2019-03-31 MED ORDER — LACTATED RINGERS IV SOLN
INTRAVENOUS | Status: DC | PRN
  Administered 2019-03-31 (×2): via INTRAVENOUS

## 2019-03-31 MED ORDER — OXYCODONE HCL ER 10 MG PO T12A
10.0000 mg | EXTENDED_RELEASE_TABLET | Freq: Two times a day (BID) | ORAL | Status: DC
  Administered 2019-03-31 – 2019-04-01 (×3): 10 mg via ORAL
  Filled 2019-03-31 (×3): qty 1

## 2019-03-31 MED ORDER — SODIUM CHLORIDE 0.9 % IV SOLN: 250.0000 mL | INTRAVENOUS | Status: DC

## 2019-03-31 MED ORDER — ONDANSETRON HCL 4 MG/2ML IJ SOLN
INTRAMUSCULAR | Status: AC
  Filled 2019-03-31: qty 2

## 2019-03-31 MED ORDER — OXYCODONE HCL 5 MG PO TABS
10.0000 mg | ORAL_TABLET | ORAL | Status: DC | PRN
  Administered 2019-03-31 – 2019-04-01 (×5): 10 mg via ORAL
  Filled 2019-03-31 (×5): qty 2

## 2019-03-31 MED ORDER — BUPIVACAINE HCL (PF) 0.5 % IJ SOLN
INTRAMUSCULAR | Status: AC
  Filled 2019-03-31: qty 30

## 2019-03-31 MED ORDER — FENTANYL CITRATE (PF) 100 MCG/2ML IJ SOLN
INTRAMUSCULAR | Status: AC
  Filled 2019-03-31: qty 2

## 2019-03-31 MED ORDER — SODIUM CHLORIDE 0.9% FLUSH: 3.0000 mL | INTRAVENOUS | Status: DC | PRN

## 2019-03-31 MED ORDER — MORPHINE SULFATE (PF) 2 MG/ML IV SOLN
2.0000 mg | INTRAVENOUS | Status: DC | PRN
  Administered 2019-03-31: 2 mg via INTRAVENOUS
  Filled 2019-03-31: qty 1

## 2019-03-31 MED ORDER — ACETAMINOPHEN 325 MG PO TABS
650.0000 mg | ORAL_TABLET | ORAL | Status: DC | PRN
  Administered 2019-03-31 (×2): 650 mg via ORAL
  Filled 2019-03-31 (×3): qty 2

## 2019-03-31 MED ORDER — CEFAZOLIN SODIUM-DEXTROSE 2-4 GM/100ML-% IV SOLN
2.0000 g | INTRAVENOUS | Status: AC
  Administered 2019-03-31: 2 g via INTRAVENOUS

## 2019-03-31 MED ORDER — PROPOFOL 10 MG/ML IV BOLUS
INTRAVENOUS | Status: AC
  Filled 2019-03-31: qty 40

## 2019-03-31 MED ORDER — THROMBIN 5000 UNITS EX SOLR
CUTANEOUS | Status: AC
  Filled 2019-03-31: qty 10000

## 2019-03-31 MED ORDER — OXYCODONE HCL 5 MG PO TABS: 5.0000 mg | ORAL_TABLET | Freq: Once | ORAL | Status: DC | PRN

## 2019-03-31 MED ORDER — ARTIFICIAL TEARS OPHTHALMIC OINT
TOPICAL_OINTMENT | OPHTHALMIC | Status: AC
  Filled 2019-03-31: qty 7

## 2019-03-31 MED ORDER — OXYCODONE HCL 5 MG PO TABS
5.0000 mg | ORAL_TABLET | ORAL | Status: DC | PRN
  Administered 2019-03-31: 5 mg via ORAL
  Filled 2019-03-31: qty 1

## 2019-03-31 MED ORDER — ONDANSETRON HCL 4 MG PO TABS: 4.0000 mg | ORAL_TABLET | Freq: Four times a day (QID) | ORAL | Status: DC | PRN

## 2019-03-31 MED ORDER — ACETAMINOPHEN 650 MG RE SUPP: 650.0000 mg | RECTAL | Status: DC | PRN

## 2019-03-31 MED ORDER — SENNOSIDES-DOCUSATE SODIUM 8.6-50 MG PO TABS: 1.0000 | ORAL_TABLET | Freq: Every evening | ORAL | Status: DC | PRN

## 2019-03-31 MED ORDER — GLYCOPYRROLATE PF 0.2 MG/ML IJ SOSY
PREFILLED_SYRINGE | INTRAMUSCULAR | Status: AC
  Filled 2019-03-31: qty 1

## 2019-03-31 SURGICAL SUPPLY — 49 items
ADH SKN CLS APL DERMABOND .7 (GAUZE/BANDAGES/DRESSINGS) ×1
APL SKNCLS STERI-STRIP NONHPOA (GAUZE/BANDAGES/DRESSINGS)
BENZOIN TINCTURE PRP APPL 2/3 (GAUZE/BANDAGES/DRESSINGS) IMPLANT
BLADE CLIPPER SURG (BLADE) IMPLANT
BUR MATCHSTICK NEURO 3.0 LAGG (BURR) ×2 IMPLANT
BUR PRECISION FLUTE 5.0 (BURR) IMPLANT
CANISTER SUCT 3000ML PPV (MISCELLANEOUS) ×2 IMPLANT
CARTRIDGE OIL MAESTRO DRILL (MISCELLANEOUS) ×1 IMPLANT
COVER WAND RF STERILE (DRAPES) ×1 IMPLANT
DECANTER SPIKE VIAL GLASS SM (MISCELLANEOUS) ×2 IMPLANT
DERMABOND ADVANCED (GAUZE/BANDAGES/DRESSINGS) ×1
DERMABOND ADVANCED .7 DNX12 (GAUZE/BANDAGES/DRESSINGS) ×1 IMPLANT
DIFFUSER DRILL AIR PNEUMATIC (MISCELLANEOUS) ×2 IMPLANT
DRAPE LAPAROTOMY 100X72X124 (DRAPES) ×2 IMPLANT
DRAPE MICROSCOPE LEICA (MISCELLANEOUS) ×2 IMPLANT
DRAPE SURG 17X23 STRL (DRAPES) ×2 IMPLANT
DURAPREP 26ML APPLICATOR (WOUND CARE) ×2 IMPLANT
ELECT REM PT RETURN 9FT ADLT (ELECTROSURGICAL) ×2
ELECTRODE REM PT RTRN 9FT ADLT (ELECTROSURGICAL) ×1 IMPLANT
GAUZE 4X4 16PLY RFD (DISPOSABLE) IMPLANT
GAUZE SPONGE 4X4 12PLY STRL (GAUZE/BANDAGES/DRESSINGS) IMPLANT
GLOVE ECLIPSE 6.5 STRL STRAW (GLOVE) ×2 IMPLANT
GLOVE EXAM NITRILE XL STR (GLOVE) IMPLANT
GOWN STRL REUS W/ TWL LRG LVL3 (GOWN DISPOSABLE) ×2 IMPLANT
GOWN STRL REUS W/ TWL XL LVL3 (GOWN DISPOSABLE) IMPLANT
GOWN STRL REUS W/TWL 2XL LVL3 (GOWN DISPOSABLE) IMPLANT
GOWN STRL REUS W/TWL LRG LVL3 (GOWN DISPOSABLE) ×4
GOWN STRL REUS W/TWL XL LVL3 (GOWN DISPOSABLE)
KIT BASIN OR (CUSTOM PROCEDURE TRAY) ×2 IMPLANT
KIT TURNOVER KIT B (KITS) ×2 IMPLANT
NDL HYPO 25X1 1.5 SAFETY (NEEDLE) ×1 IMPLANT
NDL SPNL 18GX3.5 QUINCKE PK (NEEDLE) IMPLANT
NEEDLE HYPO 25X1 1.5 SAFETY (NEEDLE) ×2 IMPLANT
NEEDLE SPNL 18GX3.5 QUINCKE PK (NEEDLE) IMPLANT
NS IRRIG 1000ML POUR BTL (IV SOLUTION) ×2 IMPLANT
OIL CARTRIDGE MAESTRO DRILL (MISCELLANEOUS) ×2
PACK LAMINECTOMY NEURO (CUSTOM PROCEDURE TRAY) ×2 IMPLANT
PAD ARMBOARD 7.5X6 YLW CONV (MISCELLANEOUS) ×6 IMPLANT
RUBBERBAND STERILE (MISCELLANEOUS) ×4 IMPLANT
SPONGE LAP 4X18 RFD (DISPOSABLE) IMPLANT
SPONGE SURGIFOAM ABS GEL SZ50 (HEMOSTASIS) ×2 IMPLANT
STRIP CLOSURE SKIN 1/2X4 (GAUZE/BANDAGES/DRESSINGS) IMPLANT
SUT VIC AB 0 CT1 18XCR BRD8 (SUTURE) ×1 IMPLANT
SUT VIC AB 0 CT1 8-18 (SUTURE) ×2
SUT VIC AB 2-0 CT1 18 (SUTURE) ×2 IMPLANT
SUT VIC AB 3-0 SH 8-18 (SUTURE) ×2 IMPLANT
TOWEL GREEN STERILE (TOWEL DISPOSABLE) ×2 IMPLANT
TOWEL GREEN STERILE FF (TOWEL DISPOSABLE) ×2 IMPLANT
WATER STERILE IRR 1000ML POUR (IV SOLUTION) ×2 IMPLANT

## 2019-03-31 NOTE — Anesthesia Procedure Notes (Signed)
Procedure Name: Intubation Date/Time: 03/31/2019 10:14 AM Performed by: Barrington Ellison, CRNA Pre-anesthesia Checklist: Patient identified, Emergency Drugs available, Suction available and Patient being monitored Patient Re-evaluated:Patient Re-evaluated prior to induction Oxygen Delivery Method: Circle System Utilized Preoxygenation: Pre-oxygenation with 100% oxygen Induction Type: IV induction Ventilation: Mask ventilation without difficulty Laryngoscope Size: Mac and 3 Grade View: Grade I Tube type: Oral Tube size: 7.0 mm Number of attempts: 1 Airway Equipment and Method: Stylet and Oral airway Placement Confirmation: ETT inserted through vocal cords under direct vision,  positive ETCO2 and breath sounds checked- equal and bilateral Secured at: 22 cm Tube secured with: Tape Dental Injury: Teeth and Oropharynx as per pre-operative assessment

## 2019-03-31 NOTE — H&P (Signed)
BP (!) 151/94   Pulse (!) 118   Temp (!) 97.5 F (36.4 C) (Oral)   Resp (!) 22   Ht 5\' 7"  (1.702 m)   Wt 94 kg   SpO2 100%   BMI 32.45 kg/m  BODY OF NOTE :  Brandy Galloway is a 53 year old woman who presents today for evaluation of pain in her back.  She is mentally challenged and comes in with her uncle.  While Brandy Galloway does speak, he certainly does most of the speaking for her.  She has had pain in her back now for a number of months.  She was referred originally to Dr. French Ana, then to Dr. Maryjean Ka.  She does have stenosis, but she did not have any clear-cut evidence of neurogenic claudication.  Dr. Maryjean Ka, therefore, did not believe she was a good candidate for injections.  So they sent her to me for further possibility of surgical intervention.  According to the uncle, the pain has gotten worse.  She loves to dance and loves [place] and certainly has done less of that recently.  He does not believe that she is weak.  She had a seizure a long time ago.  She has gained some weight recently.  She has hypercholesterolemia and back pain.     PREVIOUS OPERATIONS :  Includes toe surgery for an ingrown toenail in the right foot.     MEDICATIONS :  She takes tizanidine and Tylenol.     PHYSICAL EXAMINATION :  She is 5 feet 7 inches, weight is 209 pounds.  Blood pressure is 174/82, pulse is 92, temperature is 97.9.  Pain is 10/10.  She does stand, she does walk.  The gait is antalgic, but I believe that is from the toe.  Normal muscle tone and bulk.  Reflexes were trace at the knees, 2+ at the ankles, 2+ in the upper extremities.      REVIEW OF SYSTEMS :  Positive for hypercholesterolemia, back pain.  She has had a history of seizures, which were a long time ago.  She has gained weight.  Pain is not relieved by anything, and she ranks it as a 10/10 on a scale of 10.  She does have some mental slowing and is taken care of by her father.     She has 2+ reflexes at the biceps,  triceps, brachioradialis, knees, and ankles.  Gait is normal.  Romberg is negative.  Pupils equal, round, react to light.  Full extraocular movements.  Full visual fields.  Speech is non fluent, slightly difficult to understand, but she is able understand.  She is able to follow commands with some prompting.     ASSESSMENT AND PLAN :  Mrs.  Galloway continues to have lumbar stenosis at L4-5 and at L3-4.  I have recommended that she undergo a laminectomy for decompression of the spinal canal at L4-5 and for a serious consideration of a decompression at L3-4.  Risks and benefits of bleeding, infection, no relief, need for further surgery were discussed, damage to the nerve root causing bowel and/or bladder problems were also discussed.  Her father understands and wishes to proceed.

## 2019-03-31 NOTE — Op Note (Signed)
03/31/2019  1:19 PM  PATIENT:  Brandy Galloway  53 y.o. 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 Brandy Galloway  was positioned prone on the Wilson frame with all pressure points properly padded. Her lumbar region was prepped and draped in a sterile manner. I infiltrated lidocaine in the planned incision. I opened the skin with a 10 blade and dissected sharply to the thoracolumbar fascia. I exposed the lamina of L3,4,and L5 bilaterally. I confirmed my position with an intraoperative xray. I used the drill and Kerrison punches to perform the laminectomy. I exposed and decompressed the spinal canal and the L4 and L5 roots. I used the punches to work laterally. I irrigated once the work was completed. I approximated the throacolumbar fascia, subcutaneous, and subcuticular planes with vicryl sutures. He tolerated the procedure well and was extubated.   PLAN OF CARE: Admit for overnight observation  PATIENT DISPOSITION:  PACU - hemodynamically stable.   Delay start of Pharmacological VTE agent (>24hrs) due to surgical blood loss or risk of bleeding:  yes

## 2019-03-31 NOTE — Transfer of Care (Signed)
Immediate Anesthesia Transfer of Care Note  Patient: Brandy Galloway  Procedure(s) Performed: LAMINECTOMY LUMBAR THREE- LUMBAR FOUR, LUMBAR FOUR- LUMBAR FIVE (N/A )  Patient Location: PACU  Anesthesia Type:General  Level of Consciousness: lethargic and responds to stimulation  Airway & Oxygen Therapy: Patient Spontanous Breathing and Patient connected to face mask oxygen  Post-op Assessment: Report given to RN  Post vital signs: Reviewed and stable  Last Vitals:  Vitals Value Taken Time  BP 148/81 03/31/19 1246  Temp    Pulse 84 03/31/19 1246  Resp 19 03/31/19 1246  SpO2 98 % 03/31/19 1246  Vitals shown include unvalidated device data.  Last Pain:  Vitals:   03/31/19 0643  TempSrc: Oral         Complications: No apparent anesthesia complications

## 2019-03-31 NOTE — Anesthesia Preprocedure Evaluation (Signed)
Anesthesia Evaluation  Patient identified by MRN, date of birth, ID band Patient awake    Reviewed: Allergy & Precautions, H&P , NPO status , Patient's Chart, lab work & pertinent test results  Airway Mallampati: II   Neck ROM: full    Dental   Pulmonary neg pulmonary ROS,    breath sounds clear to auscultation       Cardiovascular hypertension,  Rhythm:regular Rate:Normal     Neuro/Psych Seizures -,  PSYCHIATRIC DISORDERS Anxiety Mental retardation   GI/Hepatic   Endo/Other    Renal/GU      Musculoskeletal   Abdominal   Peds  Hematology   Anesthesia Other Findings   Reproductive/Obstetrics                             Anesthesia Physical Anesthesia Plan  ASA: II  Anesthesia Plan: General   Post-op Pain Management:    Induction: Intravenous  PONV Risk Score and Plan: 3 and Ondansetron, Dexamethasone, Midazolam and Treatment may vary due to age or medical condition  Airway Management Planned: Oral ETT  Additional Equipment:   Intra-op Plan:   Post-operative Plan: Extubation in OR  Informed Consent: I have reviewed the patients History and Physical, chart, labs and discussed the procedure including the risks, benefits and alternatives for the proposed anesthesia with the patient or authorized representative who has indicated his/her understanding and acceptance.       Plan Discussed with: CRNA, Anesthesiologist and Surgeon  Anesthesia Plan Comments:         Anesthesia Quick Evaluation

## 2019-03-31 NOTE — Anesthesia Postprocedure Evaluation (Signed)
Anesthesia Post Note  Patient: DIAMONDNIQUE TIPPEN  Procedure(s) Performed: LAMINECTOMY LUMBAR THREE- LUMBAR FOUR, LUMBAR FOUR- LUMBAR FIVE (N/A )     Patient location during evaluation: PACU Anesthesia Type: General Level of consciousness: awake and alert Pain management: pain level controlled Vital Signs Assessment: post-procedure vital signs reviewed and stable Respiratory status: spontaneous breathing, nonlabored ventilation, respiratory function stable and patient connected to nasal cannula oxygen Cardiovascular status: blood pressure returned to baseline and stable Postop Assessment: no apparent nausea or vomiting Anesthetic complications: no    Last Vitals:  Vitals:   03/31/19 1421 03/31/19 1515  BP: (!) 171/81 (!) 161/66  Pulse:  (!) 106  Resp:  18  Temp: 36.7 C 36.5 C  SpO2:      Last Pain:  Vitals:   03/31/19 1540  TempSrc:   PainSc: 0-No pain                 Kinzee Happel S

## 2019-04-01 DIAGNOSIS — M48061 Spinal stenosis, lumbar region without neurogenic claudication: Secondary | ICD-10-CM | POA: Diagnosis not present

## 2019-04-01 MED ORDER — TIZANIDINE HCL 4 MG PO TABS: 4.0000 mg | ORAL_TABLET | Freq: Four times a day (QID) | ORAL | 0 refills | Status: DC | PRN

## 2019-04-01 MED ORDER — HYDROCODONE-ACETAMINOPHEN 5-325 MG PO TABS: 1.0000 | ORAL_TABLET | Freq: Four times a day (QID) | ORAL | 0 refills | Status: AC | PRN

## 2019-04-01 NOTE — Discharge Instructions (Signed)
Lumbar Laminectomy Care After A laminectomy is an operation performed on the spine. The purpose is to decompress the spinal cord and/or the nerve roots.  The time in surgery depends on the findings in surgery and what is necessary to correct the problems. HOME CARE INSTRUCTIONS   Check the cut (incision) made by the surgeon twice a day for signs of infection. Some signs of infection may include:   A foul smelling, greenish or yellowish discharge from the wound.   Increased pain.   Increased redness over the incision (operative) site.   The skin edges may separate.   Flu-like symptoms (problems).   A temperature above 101.5 F (38.6 C).   Change your bandages in about 24 to 36 hours following surgery or as directed.   You may shower tomorrow. Avoid bathtubs, swimming pools and hot tubs for three weeks or until your incision has healed completely. If you have stitches or staples, they may be removed 2 to 3 weeks after surgery, or as directed by your doctor. This may be done by your doctor or caregiver.   You may walk as much as you like. No need to exercise at this time. Limit lifting to ~10lbs.  Weight reduction may be beneficial if you are overweight.   Daily exercise is helpful to prevent the return of problems. Walking is permitted. You may use a treadmill without an incline. Cut down on activities and exercise if you have discomfort. You may also go up and down stairs as much as you can tolerate.   DO NOT lift anything heavier than 10 . Avoid bending or twisting at the waist. Always bend your knees when lifting.   Maintain strength and range of motion as instructed.   Do not drive for 2 to 3 weeks, or as directed by your doctors. You may be a passenger for 20 to 30 minute trips. Lying back in the passenger seat may be more comfortable for you. Always wear a seatbelt.   Limit your sitting in a regular chair to 20 to 30 minutes at a time. There are no limitations for sitting in a  recliner. You should lie down or walk in between sitting periods.   Only take over-the-counter or prescription medicines for pain, discomfort, or fever as directed by your caregiver.  SEEK MEDICAL CARE IF:   There is increased bleeding (more than a small spot) from the wound.   You notice redness, swelling, or increasing pain in the wound.   Pus is coming from wound.   You develop an unexplained oral temperature above 102 F (38.9 C) develops.   You notice a foul smell coming from the wound or dressing.   You have increasing pain in your wound.  SEEK IMMEDIATE MEDICAL CARE IF:   You develop a rash.   You have difficulty breathing.   You develop any allergic problems to medicines given.   

## 2019-04-01 NOTE — Plan of Care (Signed)
Pt's caregiver Charlotte Crumb) given D/C instructions with verbal understanding. Rx's were sent to pharmacy by MD. Pt's incision is clean and dry with no sign of infection. Pt's IV was removed prior to D/C. Pt D/C'd home via wheelchair @ 1400 per MD order. Pt is stable @ D/C and has no other needs at this time. Holli Humbles, RN

## 2019-04-22 ENCOUNTER — Ambulatory Visit: Payer: Medicaid Other

## 2019-04-29 ENCOUNTER — Other Ambulatory Visit: Payer: Self-pay

## 2019-04-29 ENCOUNTER — Ambulatory Visit: Payer: Medicaid Other

## 2019-04-29 ENCOUNTER — Ambulatory Visit (INDEPENDENT_AMBULATORY_CARE_PROVIDER_SITE_OTHER): Payer: Medicaid Other

## 2019-04-29 DIAGNOSIS — Z3042 Encounter for surveillance of injectable contraceptive: Secondary | ICD-10-CM | POA: Diagnosis not present

## 2019-04-29 NOTE — Progress Notes (Signed)
I have reviewed this chart and agree with the RN/CMA assessment and management.    K. Meryl Alanna Storti, M.D. Attending [facility] (Faculty Practice)   

## 2019-04-29 NOTE — Progress Notes (Signed)
Pt is here for depo injection, she is on time and within her window. Injection given in RUOQ without difficulty. Next injection due March 25 - Apr 8. Pt and pt's caretaker verbalizes understanding.

## 2019-05-05 ENCOUNTER — Ambulatory Visit: Payer: Medicaid Other

## 2019-07-21 ENCOUNTER — Ambulatory Visit (INDEPENDENT_AMBULATORY_CARE_PROVIDER_SITE_OTHER): Payer: Medicaid Other

## 2019-07-21 ENCOUNTER — Other Ambulatory Visit: Payer: Self-pay

## 2019-07-21 VITALS — BP 157/87 | HR 98 | Wt 191.0 lb

## 2019-07-21 DIAGNOSIS — Z3042 Encounter for surveillance of injectable contraceptive: Secondary | ICD-10-CM

## 2019-07-21 MED ORDER — MEDROXYPROGESTERONE ACETATE 150 MG/ML IM SUSP
150.0000 mg | Freq: Once | INTRAMUSCULAR | Status: AC
  Administered 2019-07-21: 150 mg via INTRAMUSCULAR

## 2019-07-21 NOTE — Progress Notes (Signed)
Presents for DEPO injection, given in LUOQ, tolerated well.  Next DEPO June 16-30, 2021  Needs Annual Exam before next DEPO.  Last PAP 12/2015  Administrations This Visit    medroxyPROGESTERone (DEPO-PROVERA) injection 150 mg    Admin Date 07/21/2019 Action Given Dose 150 mg Route Intramuscular Administered By Tamela Oddi, RMA

## 2019-10-13 ENCOUNTER — Ambulatory Visit: Payer: Medicaid Other

## 2019-10-14 ENCOUNTER — Other Ambulatory Visit: Payer: Self-pay

## 2019-10-14 ENCOUNTER — Ambulatory Visit
Admission: RE | Admit: 2019-10-14 | Discharge: 2019-10-14 | Disposition: A | Discharge status: Home / Self Care | Payer: Medicaid Other | Source: Ambulatory Visit | Attending: Nurse Practitioner | Admitting: Nurse Practitioner

## 2019-10-14 ENCOUNTER — Other Ambulatory Visit: Payer: Self-pay | Admitting: Nurse Practitioner

## 2019-10-14 DIAGNOSIS — R52 Pain, unspecified: Secondary | ICD-10-CM

## 2019-10-15 ENCOUNTER — Other Ambulatory Visit: Payer: Self-pay

## 2019-10-15 MED ORDER — MEDROXYPROGESTERONE ACETATE 150 MG/ML IM SUSP: 150.0000 mg | INTRAMUSCULAR | 3 refills | Status: DC

## 2019-10-15 NOTE — Progress Notes (Signed)
DEPO RX sent to [facility]

## 2019-10-20 ENCOUNTER — Ambulatory Visit (INDEPENDENT_AMBULATORY_CARE_PROVIDER_SITE_OTHER): Payer: Medicaid Other

## 2019-10-20 DIAGNOSIS — Z3042 Encounter for surveillance of injectable contraceptive: Secondary | ICD-10-CM | POA: Diagnosis not present

## 2019-10-20 NOTE — Progress Notes (Signed)
Nurse visit for pt supply Depo Depo given RUOQ w/o difficulty Next Depo due Sept 15-30 Caregiver aware pt needs to schedule an annual

## 2019-11-10 NOTE — Progress Notes (Signed)
Patient ID: TESNEEM DUFRANE, female   DOB: [birth year], 54 y.o.   MRN: [id number] Patient was assessed and managed by nursing staff during this encounter. I have reviewed the chart and agree with the documentation and plan. I have also made any necessary editorial changes.  Emeterio Reeve, MD 11/10/2019 1:40 PM

## 2019-12-29 ENCOUNTER — Ambulatory Visit: Payer: Medicaid Other | Admitting: Podiatry

## 2019-12-29 ENCOUNTER — Other Ambulatory Visit: Payer: Self-pay

## 2019-12-29 DIAGNOSIS — L600 Ingrowing nail: Secondary | ICD-10-CM | POA: Diagnosis not present

## 2019-12-29 MED ORDER — NEOMYCIN-POLYMYXIN-HC 3.5-10000-1 OT SUSP: OTIC | 0 refills | Status: DC

## 2019-12-29 NOTE — Progress Notes (Signed)
  Subjective:  Patient ID: Brandy Galloway, female    DOB: 07/24/1965,  MRN: [id number]  Chief Complaint  Patient presents with  . Nail Problem    Left 1st medial ingrown, pt states it is painful, denies drainage, history of removal.    54 y.o. female presents with the above complaint. History confirmed with patient.  She previously has had a removal of an ingrown on the right side by Dr. Paulla Dolly last year.  This is well-healed and has not been an issue since then.  Objective:  Physical Exam: warm, good capillary refill, no trophic changes or ulcerative lesions, normal DP and PT pulses and normal sensory exam. Left Foot: Ingrowing toenail left hallux medial border without paronychia Right Foot: Previous avulsion site is well-healed without an ingrown  Assessment:   1. Ingrowing left great toenail   2. Ingrowing right great toenail      Plan:  Patient was evaluated and treated and all questions answered.    Ingrown Nail, left -Patient elects to proceed with minor surgery to remove ingrown toenail today. Consent reviewed and signed by patient. -Ingrown nail excised. See procedure note. -Educated on post-procedure care including soaking. Written instructions provided and reviewed. -Patient to follow up in 2 weeks for nail check.  Procedure: Excision of Ingrown Toenail Location: Left 1st toe medial nail borders. Anesthesia: Lidocaine 1% plain; 1.5 mL and Marcaine 0.5% plain; 1.5 mL, digital block. Skin Prep: Betadine. Dressing: Silvadene; telfa; dry, sterile, compression dressing. Technique: Following skin prep, the toe was exsanguinated and a tourniquet was secured at the base of the toe. The affected nail border was freed, split with a nail splitter, and excised. Chemical matrixectomy was then performed with phenol and irrigated out with alcohol. The tourniquet was then removed and sterile dressing applied. Disposition: Patient tolerated procedure well. Patient to return in 2  weeks for follow-up.     Return in about 2 weeks (around 01/12/2020).

## 2019-12-29 NOTE — Patient Instructions (Signed)

## 2020-01-03 ENCOUNTER — Ambulatory Visit: Payer: Self-pay | Admitting: Podiatry

## 2020-01-04 ENCOUNTER — Other Ambulatory Visit: Payer: Self-pay | Admitting: Orthopedic Surgery

## 2020-01-04 DIAGNOSIS — M25562 Pain in left knee: Secondary | ICD-10-CM

## 2020-01-10 ENCOUNTER — Ambulatory Visit: Payer: Medicaid Other

## 2020-01-11 DIAGNOSIS — R9431 Abnormal electrocardiogram [ECG] [EKG]: Secondary | ICD-10-CM | POA: Insufficient documentation

## 2020-01-11 NOTE — Progress Notes (Signed)
Patient referred by Nolene Ebbs, MD for abnormal EKG  Subjective:   Leanord Asal, female    DOB: [birth year], 54 y.o.   MRN: [id number]   Chief Complaint  Patient presents with  . Abnormal ECG  . New Patient (Initial Visit)     HPI  54 y.o. African American female with hyppertension, hyperlipidemia, prediabetes, mental retardation, referred for evaluation of abnormal EKG.  Per PCPs note, there is concern for long QT on healing. Stated EKG not available for my review today.  Patient is here today with her guardian and uncle Hart [place]. Patient is poor historian, tells me her left knee hurts. Denies any chest pain, shortness of breath. Patient does not have her medications with her and neither patient nor her guardian is able to recount her medications.   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   Past Medical History:  Diagnosis Date  . Anxiety   . Hypertension   . 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 Mental retardation   . Seizures ([place])    none in 20 years or more  . Seizures ([place])    years ago; more than 20 years ago     Past Surgical History:  Procedure Laterality Date  . CERVICAL CONE BIOPSY  2009  . CERVICAL CONIZATION W/BX  08/20/2011   Procedure: CONIZATION CERVIX WITH BIOPSY;  Surgeon: Emily Filbert, MD;  Location: [facility];  Service: Gynecology;  Laterality: N/A;  With Endocervical Currettage  . COLPOSCOPY  06/2011  . COLPOSCOPY N/A 05/28/2016   Procedure: COLPOSCOPY;  Surgeon: Mora Bellman, MD;  Location: [facility];  Service: Gynecology;  Laterality: N/A;  . DILATION AND CURETTAGE OF UTERUS N/A 05/28/2016   Procedure: DILATATION AND CURETTAGE;  Surgeon: Mora Bellman, MD;  Location: [facility];  Service: Gynecology;  Laterality: N/A;  . LEEP  2009  . LUMBAR LAMINECTOMY/DECOMPRESSION MICRODISCECTOMY N/A 03/31/2019   Procedure: LAMINECTOMY LUMBAR THREE- LUMBAR FOUR,  LUMBAR FOUR- LUMBAR FIVE;  Surgeon: Ashok Pall, MD;  Location: [facility];  Service: Neurosurgery;  Laterality: N/A;  LAMINECTOMY LUMBAR THREE- LUMBAR FOUR, LUMBAR FOUR- LUMBAR FIVE     Social History   Tobacco Use  Smoking Status Never Smoker  Smokeless Tobacco Never Used    Social History   Substance and Sexual Activity  Alcohol Use No     Family History  Problem Relation Age of Onset  . Diabetes Father   . Diabetes Mother      Current Outpatient Medications on File Prior to Visit  Medication Sig Dispense Refill  . acetaminophen (TYLENOL) 500 MG tablet Take 500 mg by mouth every 6 (six) hours as needed for moderate pain.     Marland Kitchen amLODipine (NORVASC) 10 MG tablet Take 10 mg by mouth daily.   2  . amLODipine (NORVASC) 5 MG tablet Take 5 mg by mouth at bedtime.    Marland Kitchen atorvastatin (LIPITOR) 20 MG tablet Take 20 mg by mouth at bedtime.    . calcium gluconate 500 MG tablet Take 1 tablet by mouth daily.     . diclofenac Sodium (VOLTAREN) 1 % GEL SMARTSIG:Gram(s) Topical 4 Times Daily PRN    . diphenhydramine-acetaminophen (TYLENOL PM) 25-500 MG TABS tablet Take 1 tablet by mouth at bedtime.     . docusate sodium (COLACE) 100 MG capsule Take 100 mg by mouth daily as needed for mild constipation.    Marland Kitchen  furosemide (LASIX) 20 MG tablet Take 20 mg by mouth every morning.    . hydrochlorothiazide (HYDRODIURIL) 12.5 MG tablet Take 12.5 mg by mouth daily.  2  . HYDROcodone-acetaminophen (NORCO/VICODIN) 5-325 MG tablet Take 1 tablet by mouth every 6 (six) hours as needed for moderate pain.     . hydrocortisone 1 % ointment Apply topically 2 (two) times daily as needed.    . hydrOXYzine (ATARAX/VISTARIL) 25 MG tablet Take 25 mg by mouth 3 (three) times daily as needed for itching.    Marland Kitchen ibuprofen (ADVIL,MOTRIN) 400 MG tablet Take 1.5 tablets (600 mg total) by mouth every 6 (six) hours as needed for mild pain. 30 tablet 0  . ibuprofen (ADVIL,MOTRIN) 600 MG tablet Take 1 tablet (600 mg total) by  mouth every 6 (six) hours as needed. 60 tablet 5  . ibuprofen (ADVIL,MOTRIN) 800 MG tablet Take 1 tablet (800 mg total) by mouth 3 (three) times daily with meals as needed for headache or moderate pain. 30 tablet 4  . medroxyPROGESTERone (DEPO-PROVERA) 150 MG/ML injection INJECT 1 ML( 150 MG) IN THE MUSCLE EVERY 3 MONTHS 1 mL 0  . medroxyPROGESTERone (DEPO-PROVERA) 150 MG/ML injection Inject 1 mL (150 mg total) into the muscle every 3 (three) months. 1 mL 3  . medroxyPROGESTERone (DEPO-PROVERA) 150 MG/ML injection Inject 1 mL (150 mg total) into the muscle every 3 (three) months. 1 mL 3  . medroxyPROGESTERone (DEPO-PROVERA) 150 MG/ML injection Inject 1 mL (150 mg total) into the muscle every 3 (three) months. 1 mL 3  . meloxicam (MOBIC) 15 MG tablet Take 15 mg by mouth daily.     . metFORMIN (GLUCOPHAGE) 500 MG tablet Take 500 mg by mouth 2 (two) times daily.    . metoprolol tartrate (LOPRESSOR) 25 MG tablet Take 25 mg by mouth 2 (two) times daily.    Marland Kitchen neomycin-polymyxin-hydrocortisone (CORTISPORIN) 3.5-10000-1 OTIC suspension Apply 1-2 drops daily after soaking and cover with bandaid 10 mL 0  . polyethylene glycol (MIRALAX / GLYCOLAX) packet Take 17 g by mouth daily as needed for moderate constipation.     Marland Kitchen tiZANidine (ZANAFLEX) 4 MG tablet Take 1 tablet (4 mg total) by mouth every 6 (six) hours as needed for muscle spasms. 60 tablet 0  . triamterene-hydrochlorothiazide (DYAZIDE) 37.5-25 MG capsule Take 1 capsule by mouth daily.    . valsartan (DIOVAN) 160 MG tablet Take 160 mg by mouth daily.    . Vitamin D, Ergocalciferol, (DRISDOL) 1.25 MG (50000 UNIT) CAPS capsule Take 50,000 Units by mouth once a week.     Current Facility-Administered Medications on File Prior to Visit  Medication Dose Route Frequency Provider Last Rate Last Admin  . medroxyPROGESTERone (DEPO-PROVERA) injection 150 mg  150 mg Intramuscular Q90 days Constant, Peggy, MD   150 mg at 01/12/20 1104    Cardiovascular and  other pertinent studies:  EKG 01/12/2020: Sinus rhythm 92 bpm QTc within normal limits Normal EKG  EKG 11/01/2018: Sinus arrhtymia   Recent labs: 11/16/2019: Troponin, CK normal 10/12/2019: Glucose 101, BUN/Cr 14/0.95. EGFR 79. Na/K 140/4.0. Protein, Total: 8.3 H/H 13.0/39.6. MCV 91.2. Platelets 311 HbA1C 6.3% Chol 235, TG 167, HDL 49, LDL 156   Review of Systems  Unable to perform ROS: other  Limited due to mental retardation. Tells me she has knee pain      Vitals:   01/12/20 1354 01/12/20 1403  BP: (!) 180/103 (!) 173/79  Pulse: (!) 102 85  SpO2: 97%      Body mass  index is 28.89 kg/m. Filed Weights   01/12/20 1354  Weight: 179 lb (81.2 kg)     Objective:   Physical Exam Vitals and nursing note reviewed.  Constitutional:      General: She is not in acute distress. Neck:     Vascular: No JVD.  Cardiovascular:     Rate and Rhythm: Normal rate and regular rhythm.     Heart sounds: Normal heart sounds. No murmur heard.   Pulmonary:     Effort: Pulmonary effort is normal.     Breath sounds: Normal breath sounds. No wheezing or rales.         Assessment & Recommendations:   54 y.o. African American female with hyppertension, hyperlipidemia, prediabetes, mental retardation, referred for evaluation of abnormal EKG.   Condition for referral to me was EKG abnormality.  I reviewed her EKG from today, husband has a previous EKG available in epic system.  Both EKGs are normal in terms of QT interval. There is no evidence of old infarct. Patient does need management for hypertension and hyperlipidemia.  Given that she does not have any medications with her today, I am unable to recommend changing this.  Recommend continued follow-up with PCP regarding management of the same.  I will see her on as-needed basis.  Thank you for referring the patient to [place]. Please feel free to contact with any questions.   Nigel Mormon, MD Pager: [phone] Office:  [phone]

## 2020-01-12 ENCOUNTER — Other Ambulatory Visit (HOSPITAL_COMMUNITY)
Admission: RE | Admit: 2020-01-12 | Discharge: 2020-01-12 | Disposition: A | Discharge status: Home / Self Care | Payer: Medicaid Other | Source: Ambulatory Visit | Attending: Obstetrics and Gynecology | Admitting: Obstetrics and Gynecology

## 2020-01-12 ENCOUNTER — Ambulatory Visit: Payer: Medicaid Other | Admitting: Cardiology

## 2020-01-12 ENCOUNTER — Encounter: Payer: Self-pay | Admitting: Cardiology

## 2020-01-12 ENCOUNTER — Other Ambulatory Visit: Payer: Self-pay

## 2020-01-12 ENCOUNTER — Encounter: Payer: Self-pay | Admitting: Obstetrics and Gynecology

## 2020-01-12 ENCOUNTER — Ambulatory Visit (INDEPENDENT_AMBULATORY_CARE_PROVIDER_SITE_OTHER): Payer: Medicaid Other | Admitting: Obstetrics and Gynecology

## 2020-01-12 VITALS — BP 173/79 | HR 85 | Ht 66.0 in | Wt 179.0 lb

## 2020-01-12 VITALS — BP 158/79 | HR 78 | Ht 66.0 in | Wt 188.0 lb

## 2020-01-12 DIAGNOSIS — Z30013 Encounter for initial prescription of injectable contraceptive: Secondary | ICD-10-CM

## 2020-01-12 DIAGNOSIS — Z01419 Encounter for gynecological examination (general) (routine) without abnormal findings: Secondary | ICD-10-CM

## 2020-01-12 DIAGNOSIS — R9431 Abnormal electrocardiogram [ECG] [EKG]: Secondary | ICD-10-CM

## 2020-01-12 NOTE — Progress Notes (Addendum)
Pt presents for annual Pt requested RUOQ site for Depo inj given without difficulty. . Administrations This Visit    medroxyPROGESTERone (DEPO-PROVERA) injection 150 mg    Admin Date 01/12/2020 Action Given Dose 150 mg Route Intramuscular Administered By Hollice Gong, CMA

## 2020-01-12 NOTE — Progress Notes (Signed)
Subjective:     Brandy Galloway is a 54 y.o. femaleP0 with BMI 30 who is here for a comprehensive physical exam. The patient reports no problems. She denies any pelvic pain or abnormal discharge. She is not sexually active. She denies any episode of vaginal bleeding. She denies any urinary incontinence. History gathered by both patient and care taker. Patient suffers from mental retardation  Past Medical History:  Diagnosis Date  . Anxiety   . Hypertension   . 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 Mental retardation   . Seizures ([place])    none in 20 years or more  . Seizures ([place])    years ago; more than 20 years ago   Past Surgical History:  Procedure Laterality Date  . CERVICAL CONE BIOPSY  2009  . CERVICAL CONIZATION W/BX  08/20/2011   Procedure: CONIZATION CERVIX WITH BIOPSY;  Surgeon: Emily Filbert, MD;  Location: [facility];  Service: Gynecology;  Laterality: N/A;  With Endocervical Currettage  . COLPOSCOPY  06/2011  . COLPOSCOPY N/A 05/28/2016   Procedure: COLPOSCOPY;  Surgeon: Mora Bellman, MD;  Location: [facility];  Service: Gynecology;  Laterality: N/A;  . DILATION AND CURETTAGE OF UTERUS N/A 05/28/2016   Procedure: DILATATION AND CURETTAGE;  Surgeon: Mora Bellman, MD;  Location: [facility];  Service: Gynecology;  Laterality: N/A;  . LEEP  2009  . LUMBAR LAMINECTOMY/DECOMPRESSION MICRODISCECTOMY N/A 03/31/2019   Procedure: LAMINECTOMY LUMBAR THREE- LUMBAR FOUR, LUMBAR FOUR- LUMBAR FIVE;  Surgeon: Ashok Pall, MD;  Location: [facility];  Service: Neurosurgery;  Laterality: N/A;  LAMINECTOMY LUMBAR THREE- LUMBAR FOUR, LUMBAR FOUR- LUMBAR FIVE   Family History  Problem Relation Age of Onset  . Diabetes Father   . Diabetes Mother      Social History   Socioeconomic History  . Marital status: Single    Spouse name: Not on file  . Number of children: Not on file  . Years of education: Not on file  . Highest education level: Not on file  Occupational  History  . Not on file  Tobacco Use  . Smoking status: Never Smoker  . Smokeless tobacco: Never Used  Vaping Use  . Vaping Use: Never used  Substance and Sexual Activity  . Alcohol use: No  . Drug use: No  . Sexual activity: Never    Birth control/protection: None  Other Topics Concern  . Not on file  Social History Narrative  . Not on file   Social Determinants of Health   Financial Resource Strain:   . Difficulty of Paying Living Expenses: Not on file  Food Insecurity:   . Worried About Charity fundraiser in the Last Year: Not on file  . Ran Out of Food in the Last Year: Not on file  Transportation Needs:   . Lack of Transportation (Medical): Not on file  . Lack of Transportation (Non-Medical): Not on file  Physical Activity:   . Days of Exercise per Week: Not on file  . Minutes of Exercise per Session: Not on file  Stress:   . Feeling of Stress : Not on file  Social Connections:   . Frequency of Communication with Friends and Family: Not on file  . Frequency of Social Gatherings with Friends and Family: Not on file  . Attends Religious Services: Not on file  . Active Member of Clubs or Organizations: Not on file  . Attends Archivist Meetings: Not on file  .  Marital Status: Not on file  Intimate Partner Violence:   . Fear of Current or Ex-Partner: Not on file  . Emotionally Abused: Not on file  . Physically Abused: Not on file  . Sexually Abused: Not on file   Health Maintenance  Topic Date Due  . Hepatitis C Screening  Never done  . HIV Screening  Never done  . TETANUS/TDAP  Never done  . COLONOSCOPY  Never done  . PAP SMEAR-Modifier  01/07/2019  . INFLUENZA VACCINE  Never done  . MAMMOGRAM  06/30/2020       Review of Systems Pertinent items noted in HPI and remainder of comprehensive ROS otherwise negative.   Objective:  Blood pressure (!) 158/79, pulse 78, height 5\' 6"  (1.676 m), weight 188 lb (85.3 kg).     GENERAL: Well-developed,  well-nourished female in no acute distress.  HEENT: Normocephalic, atraumatic. Sclerae anicteric.  NECK: Supple. Normal thyroid.  LUNGS: Clear to auscultation bilaterally.  HEART: Regular rate and rhythm. BREASTS: Symmetric in size. No palpable masses or lymphadenopathy, skin changes, or nipple drainage. ABDOMEN: Soft, nontender, nondistended. No organomegaly. PELVIC: Normal external female genitalia. Vagina is pink and rugated.  Normal discharge. Normal appearing cervix. Uterus is normal in size. No adnexal mass or tenderness. EXTREMITIES: No cyanosis, clubbing, or edema, 2+ distal pulses.    Assessment:    Healthy female exam.      Plan:    Pap smear collected Screening mammogram ordered Discussed discontinuation of depo-provera with care taker given patient age. He expressed concerns of return of AUB and would prefer continuing with depo-provera for now.  Will offer [place] and LH to assess menopausal status See After Visit Summary for Counseling Recommendations

## 2020-01-13 LAB — LUTEINIZING HORMONE: LH: 27 m[IU]/mL

## 2020-01-13 LAB — FOLLICLE STIMULATING HORMONE: FSH: 37.4 m[IU]/mL

## 2020-01-17 LAB — CYTOLOGY - PAP
Adequacy: ABSENT
Comment: NEGATIVE
High risk HPV: POSITIVE — AB

## 2020-01-20 ENCOUNTER — Ambulatory Visit: Payer: Medicaid Other | Admitting: Podiatry

## 2020-01-23 ENCOUNTER — Other Ambulatory Visit: Payer: Self-pay

## 2020-01-23 ENCOUNTER — Ambulatory Visit
Admission: RE | Admit: 2020-01-23 | Discharge: 2020-01-23 | Disposition: A | Discharge status: Home / Self Care | Payer: Medicaid Other | Source: Ambulatory Visit | Attending: Orthopedic Surgery | Admitting: Orthopedic Surgery

## 2020-01-23 DIAGNOSIS — M25562 Pain in left knee: Secondary | ICD-10-CM

## 2020-01-31 NOTE — Progress Notes (Signed)
Letter printed to mail to patient regarding abnormal lab results.

## 2020-02-07 ENCOUNTER — Other Ambulatory Visit: Payer: Self-pay

## 2020-02-07 ENCOUNTER — Telehealth (INDEPENDENT_AMBULATORY_CARE_PROVIDER_SITE_OTHER): Payer: Medicaid Other | Admitting: Obstetrics and Gynecology

## 2020-02-07 ENCOUNTER — Encounter: Payer: Self-pay | Admitting: Obstetrics and Gynecology

## 2020-02-07 DIAGNOSIS — R87612 Low grade squamous intraepithelial lesion on cytologic smear of cervix (LGSIL): Secondary | ICD-10-CM

## 2020-02-07 NOTE — Progress Notes (Signed)
GYNECOLOGY VIRTUAL VISIT ENCOUNTER NOTE  Provider location: [facility] [place]   I connected with Brandy Galloway on 02/07/20 at  1:15 PM EDT by MyChart Video Encounter at home and verified that I am speaking with the correct person using two identifiers.   I discussed the limitations, risks, security and privacy concerns of performing an evaluation and management service virtually and the availability of in person appointments. I also discussed with the patient that there may be a patient responsible charge related to this service. The patient expressed understanding and agreed to proceed.   History:  Brandy Galloway is a 54 y.o. G0P0 female being evaluated today to discuss results of recent pap smear and colposcopy. Patient suffers from mental retardation and information was shared with her care taker Mr. Wynetta Emery (patient's uncle). She denies any abnormal vaginal discharge, bleeding, pelvic pain or other concerns.       Past Medical History:  Diagnosis Date  . Anxiety   . Hypertension   . 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 Mental retardation   . Seizures ([place])    none in 20 years or more  . Seizures ([place])    years ago; more than 20 years ago   Past Surgical History:  Procedure Laterality Date  . CERVICAL CONE BIOPSY  2009  . CERVICAL CONIZATION W/BX  08/20/2011   Procedure: CONIZATION CERVIX WITH BIOPSY;  Surgeon: Emily Filbert, MD;  Location: [facility];  Service: Gynecology;  Laterality: N/A;  With Endocervical Currettage  . COLPOSCOPY  06/2011  . COLPOSCOPY N/A 05/28/2016   Procedure: COLPOSCOPY;  Surgeon: Mora Bellman, MD;  Location: [facility];  Service: Gynecology;  Laterality: N/A;  . DILATION AND CURETTAGE OF UTERUS N/A 05/28/2016   Procedure: DILATATION AND CURETTAGE;  Surgeon: Mora Bellman, MD;  Location: [facility];  Service: Gynecology;  Laterality: N/A;  . LEEP  2009  . LUMBAR LAMINECTOMY/DECOMPRESSION MICRODISCECTOMY  N/A 03/31/2019   Procedure: LAMINECTOMY LUMBAR THREE- LUMBAR FOUR, LUMBAR FOUR- LUMBAR FIVE;  Surgeon: Ashok Pall, MD;  Location: [facility];  Service: Neurosurgery;  Laterality: N/A;  LAMINECTOMY LUMBAR THREE- LUMBAR FOUR, LUMBAR FOUR- LUMBAR FIVE   The following portions of the patient's history were reviewed and updated as appropriate: allergies, current medications, past family history, past medical history, past social history, past surgical history and problem list.   Health Maintenance:  Normal mammogram on 06/2018.   Review of Systems:  Pertinent items noted in HPI and remainder of comprehensive ROS otherwise negative.  Physical Exam:   General:  Alert, oriented and cooperative. Patient appears to be in no acute distress.  Mental Status: Normal mood and affect. Normal behavior. Normal judgment and thought content.   Respiratory: Normal respiratory effort, no problems with respiration noted  Rest of physical exam deferred due to type of encounter  Labs and Imaging No results found for this or any previous visit (from the past 336 hour(s)). MR KNEE LEFT WO CONTRAST  Result Date: 01/23/2020 CLINICAL DATA:  Left knee pain and swelling for 8 months. No known injury. EXAM: MRI OF THE LEFT KNEE WITHOUT CONTRAST TECHNIQUE: Multiplanar, multisequence MR imaging of the knee was performed. No intravenous contrast was administered. COMPARISON:  Plain films left knee 10/21/2019. FINDINGS: This examination is markedly degraded by motion MENISCI Medial meniscus: Very difficult to assess but there may be a tear at the junction of the posterior horn and body. Lateral meniscus: Very difficult to  assess but the body appears diminutive and there may be a horizontal tear in the midbody. LIGAMENTS Cruciates:  Intact. Collaterals:  Intact. CARTILAGE Patellofemoral:  Grossly normal. Medial:  Grossly normal. Lateral:  Grossly normal. Joint:  Small effusion. Popliteal Fossa:  No Baker's cyst. Extensor Mechanism:   Intact. Bones:  Normal signal throughout. Other: None. IMPRESSION: This examination is severely limited by patient motion despite repeated efforts to reduce motion. The patient may have a tear at the junction of the posterior horn and body of the lateral meniscus and in the anterior body of the lateral meniscus as described above. Definitive characterization is not possible. Electronically Signed   By: Inge Rise M.D.   On: 01/23/2020 12:34       Assessment and Plan:     1. Low grade squamous intraepithelial lesion on cytologic smear of cervix (LGSIL) Reviewed results with patient and her caregiver Discussed benefits of colposcopy Discussed performing this exam under anesthesia for patient comfort given.  Uncle is very hesitant about having this procedure done. He will take some time to think about it and will let us know when ready to proceed.       I discussed the assessment and treatment plan with the patient. The patient was provided an opportunity to ask questions and all were answered. The patient agreed with the plan and demonstrated an understanding of the instructions.   The patient was advised to call back or seek an in-person evaluation/go to the ED if the symptoms worsen or if the condition fails to improve as anticipated.  I provided 15 minutes of face-to-face time during this encounter.   Mora Bellman, MD [facility]

## 2020-02-18 ENCOUNTER — Ambulatory Visit: Payer: Medicaid Other

## 2020-03-09 ENCOUNTER — Other Ambulatory Visit: Payer: Self-pay

## 2020-03-09 ENCOUNTER — Ambulatory Visit (INDEPENDENT_AMBULATORY_CARE_PROVIDER_SITE_OTHER): Payer: Medicaid Other | Admitting: Podiatry

## 2020-03-09 DIAGNOSIS — M2041 Other hammer toe(s) (acquired), right foot: Secondary | ICD-10-CM

## 2020-03-09 DIAGNOSIS — L84 Corns and callosities: Secondary | ICD-10-CM | POA: Diagnosis not present

## 2020-03-12 ENCOUNTER — Encounter: Payer: Self-pay | Admitting: Podiatry

## 2020-03-12 NOTE — Progress Notes (Signed)
  Subjective:  Patient ID: Brandy Galloway, female    DOB: Mar 11, 1966,  MRN: [id number]  Chief Complaint  Patient presents with  . Toe Pain    Has a "cyst between toes" on right 4/5 interspace    54 y.o. female presents with the above complaint. History confirmed with patient.   Objective:  Physical Exam: warm, good capillary refill, no trophic changes or ulcerative lesions, normal DP and PT pulses and normal sensory exam.   Right Foot: Hammertoes 4/5 with a heloma molle kissing lesion between the fourth and third toes   Assessment:  No diagnosis found.   Plan:  Patient was evaluated and treated and all questions answered.  All symptomatic hyperkeratoses were safely debrided with a sterile #15 blade to patient's level of comfort without incident. We discussed preventative and palliative care of these lesions including supportive and accommodative shoegear, padding, prefabricated and custom molded accommodative orthoses, use of a pumice stone and lotions/creams daily.  Silicone pad dispensed  Discussed how hammertoe deformities contribute to this.  Could consider correction future if indicated  No follow-ups on file.

## 2020-03-15 ENCOUNTER — Ambulatory Visit: Payer: Medicaid Other

## 2020-03-15 ENCOUNTER — Ambulatory Visit
Admission: RE | Admit: 2020-03-15 | Discharge: 2020-03-15 | Disposition: A | Discharge status: Home / Self Care | Payer: Medicaid Other | Source: Ambulatory Visit | Attending: Obstetrics and Gynecology | Admitting: Obstetrics and Gynecology

## 2020-03-15 ENCOUNTER — Encounter: Payer: Medicaid Other | Admitting: Podiatry

## 2020-03-15 ENCOUNTER — Other Ambulatory Visit: Payer: Self-pay

## 2020-03-15 DIAGNOSIS — M2041 Other hammer toe(s) (acquired), right foot: Secondary | ICD-10-CM

## 2020-03-15 DIAGNOSIS — Z01419 Encounter for gynecological examination (general) (routine) without abnormal findings: Secondary | ICD-10-CM

## 2020-03-15 NOTE — Progress Notes (Signed)
This encounter was created in error - please disregard.

## 2020-03-20 ENCOUNTER — Other Ambulatory Visit: Payer: Self-pay | Admitting: Podiatry

## 2020-03-20 ENCOUNTER — Other Ambulatory Visit: Payer: Self-pay

## 2020-03-20 ENCOUNTER — Ambulatory Visit (INDEPENDENT_AMBULATORY_CARE_PROVIDER_SITE_OTHER): Payer: Medicaid Other

## 2020-03-20 ENCOUNTER — Ambulatory Visit (INDEPENDENT_AMBULATORY_CARE_PROVIDER_SITE_OTHER): Payer: Medicaid Other | Admitting: Podiatry

## 2020-03-20 DIAGNOSIS — M2042 Other hammer toe(s) (acquired), left foot: Secondary | ICD-10-CM

## 2020-03-20 DIAGNOSIS — M79675 Pain in left toe(s): Secondary | ICD-10-CM | POA: Diagnosis not present

## 2020-03-20 DIAGNOSIS — R52 Pain, unspecified: Secondary | ICD-10-CM

## 2020-03-20 MED ORDER — HYDROCODONE-ACETAMINOPHEN 5-325 MG PO TABS: 1.0000 | ORAL_TABLET | Freq: Four times a day (QID) | ORAL | 0 refills | Status: DC | PRN

## 2020-03-20 NOTE — Progress Notes (Signed)
   HPI: 54 y.o. female presenting today for follow-up evaluation of pain to the left fourth toe secondary to hypertrophic bone spur to the lateral aspect rubbing against the fifth toe.  Patient presents today with her caregiver, [name], for follow-up treatment evaluation.  Last visit the callus lesion overlying the bone was debrided however the patient continues to have pain and tenderness associated to the area despite conservative treatments.  Past Medical History:  Diagnosis Date  . Anxiety   . Hypertension   . 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 Mental retardation   . Seizures ([place])    none in 20 years or more  . Seizures ([place])    years ago; more than 20 years ago     Physical Exam: General: The patient is alert and oriented x3 in no acute distress.  Dermatology: Skin is warm, dry and supple bilateral lower extremities. Negative for open lesions or macerations.  Vascular: Palpable pedal pulses bilaterally. No edema or erythema noted. Capillary refill within normal limits.  Neurological: Epicritic and protective threshold grossly intact bilaterally.   Musculoskeletal Exam: Range of motion within normal limits to all pedal and ankle joints bilateral. Muscle strength 5/5 in all groups bilateral.  There is a sharp palpable hypertrophic head of the proximal phalanx noted to the left fourth toe  Radiographic Exam:  Normal osseous mineralization. Joint spaces preserved. No fracture/dislocation/boney destruction.    Assessment: 1.  Hammertoe/hypertrophic hand of the proximal phalanx/palpable bone spur left fourth toe   Plan of Care:  1. Patient evaluated. X-Rays reviewed.  2. Today we discussed the conservative versus surgical management of the presenting pathology. The patient opts for surgical management. All possible complications and details of the procedure were explained. All patient questions were answered. No guarantees were  expressed or implied. 3. Authorization for surgery was initiated today. Surgery will consist of left fourth toe arthroplasty.  Surgery will be performed in office. 4.  Return to clinic morning of in office procedure        Edrick Kins, DPM [facility]  Dr. Edrick Kins, DPM    [address], [place] [address]                Office [phone]  Fax [phone]

## 2020-03-21 ENCOUNTER — Telehealth: Payer: Self-pay

## 2020-03-21 NOTE — Telephone Encounter (Signed)
DOS 03/29/2020 --OFFICE SURGERY  HAMMERTOE REPAIR 4TH LT - [id number]  RECEIVED CALL FROM [place] WITH [employer], SHE STATED NO PRECERT REQUIRED SINCE PROCEDURE WILL BE IN OFFICE. REF # [id number]

## 2020-03-22 ENCOUNTER — Ambulatory Visit: Payer: Medicaid Other | Admitting: Podiatry

## 2020-03-25 NOTE — Progress Notes (Signed)
This encounter was created in error - please disregard.

## 2020-03-29 ENCOUNTER — Ambulatory Visit (INDEPENDENT_AMBULATORY_CARE_PROVIDER_SITE_OTHER): Payer: Medicaid Other | Admitting: Podiatry

## 2020-03-29 ENCOUNTER — Other Ambulatory Visit: Payer: Self-pay

## 2020-03-29 DIAGNOSIS — M2042 Other hammer toe(s) (acquired), left foot: Secondary | ICD-10-CM | POA: Diagnosis not present

## 2020-03-29 NOTE — Progress Notes (Signed)
   OPERATIVE REPORT Patient name: Brandy Galloway MRN: [id number] DOB: 28-Aug-1965  DOS:  03/29/20  Preop Dx: Garner Nash 4th digit left Postop Dx: same  Procedure:  1. PIPJ arthroplasty 4th digit left  Surgeon: Edrick Kins DPM  Anesthesia:  2% lidocaine plain totaling 4 mL infiltrated in the patient's left foot digital block fashion  Hemostasis: None  EBL: Minimal mL Materials: None Injectables: None Pathology: None  Condition: The patient tolerated the procedure and anesthesia well. No complications noted or reported   Justification for procedure: The patient is a 54 y.o. female who presents today for surgical correction of a symptomatic hammertoe to the fourth digit of the left foot. All conservative modalities of been unsuccessful in providing any sort of satisfactory alleviation of symptoms with the patient. The patient was told benefits as well as possible side effects of the surgery. The patient consented for surgical correction. The patient consent form was reviewed. All patient questions were answered. No guarantees were expressed or implied.   Procedure in Detail: The patient was brought to the procedure room, placed in the procedure chair in the supine position at which time an aseptic scrub and drape were performed about the patient's respective lower extremity after anesthesia was induced as described above. Attention was then directed to the surgical area where procedure number one commenced.  Procedure #1: PIPJ arthroplasty fourth digit left foot A 1.0 cm transverse elliptical incision was planned and made overlying the hypertrophic head of the proximal phalanx of the fourth digit left foot using a surgical 15 scalpel.  Incision was carried down to level of the EDL tendon at which time a transverse tenotomy was performed to expose the underlying head of the proximal phalanx.  Soft tissue was sharply reflected away from the head of the proximal phalanx in preparation  for the ensuing osteotomy.  Osteotomy using a bone cutter was performed at the surgical phalangeal neck of the proximal phalanx left fourth digit.  Irrigation was utilized and preparation for primary closure.  4-0 Vicryl was utilized to reapproximate the opposing ends of the EDL tendon.  4-0 nylon was utilized to reapproximate superficial skin edges.  Dry sterile compressive dressings were then applied to all previously mentioned incision sites about the patient's lower extremity. The tourniquet which was used for hemostasis was deflated. All normal neurovascular responses including pink color and warmth returned all the digits of patient's lower extremity.  The patient was then discharged from the office with adequate prescriptions for analgesia. Verbal as well as written instructions were provided for the patient regarding wound care. The patient is to keep the dressings clean dry and intact until they are to follow surgeon Dr. Daylene Katayama in the office upon discharge in one week.   Edrick Kins, DPM [facility]  Dr. Edrick Kins, DPM    [address], [place] [address]                Office [phone]  Fax [phone]

## 2020-03-30 ENCOUNTER — Telehealth: Payer: Self-pay | Admitting: Podiatry

## 2020-03-30 ENCOUNTER — Other Ambulatory Visit: Payer: Self-pay | Admitting: Podiatry

## 2020-03-30 MED ORDER — HYDROCODONE-ACETAMINOPHEN 5-325 MG PO TABS: 1.0000 | ORAL_TABLET | Freq: Four times a day (QID) | ORAL | 0 refills | Status: DC | PRN

## 2020-03-30 NOTE — Telephone Encounter (Signed)
Rx sent. Thanks, Dr. Amaru Burroughs

## 2020-03-30 NOTE — Progress Notes (Signed)
PRN postop 

## 2020-03-30 NOTE — Telephone Encounter (Signed)
Patients guardian has requested refill for pain meds, please advise

## 2020-04-05 ENCOUNTER — Other Ambulatory Visit: Payer: Self-pay

## 2020-04-05 ENCOUNTER — Encounter: Payer: Medicaid Other | Admitting: Podiatry

## 2020-04-05 ENCOUNTER — Ambulatory Visit (INDEPENDENT_AMBULATORY_CARE_PROVIDER_SITE_OTHER): Payer: Medicaid Other

## 2020-04-05 ENCOUNTER — Ambulatory Visit: Payer: Medicaid Other

## 2020-04-05 ENCOUNTER — Ambulatory Visit (INDEPENDENT_AMBULATORY_CARE_PROVIDER_SITE_OTHER): Payer: Medicaid Other | Admitting: Podiatry

## 2020-04-05 DIAGNOSIS — Z9889 Other specified postprocedural states: Secondary | ICD-10-CM

## 2020-04-05 DIAGNOSIS — M2042 Other hammer toe(s) (acquired), left foot: Secondary | ICD-10-CM

## 2020-04-10 ENCOUNTER — Telehealth: Payer: Self-pay | Admitting: Podiatry

## 2020-04-10 NOTE — Telephone Encounter (Signed)
Pt has called in severe pain, she is requesting more pain meds.

## 2020-04-12 ENCOUNTER — Other Ambulatory Visit: Payer: Self-pay

## 2020-04-12 ENCOUNTER — Ambulatory Visit (INDEPENDENT_AMBULATORY_CARE_PROVIDER_SITE_OTHER): Payer: Medicaid Other | Admitting: Podiatry

## 2020-04-12 DIAGNOSIS — Z9889 Other specified postprocedural states: Secondary | ICD-10-CM

## 2020-04-17 ENCOUNTER — Other Ambulatory Visit: Payer: Self-pay

## 2020-04-17 ENCOUNTER — Encounter: Payer: Self-pay | Admitting: Podiatry

## 2020-04-17 ENCOUNTER — Ambulatory Visit (INDEPENDENT_AMBULATORY_CARE_PROVIDER_SITE_OTHER): Payer: Medicaid Other | Admitting: Podiatry

## 2020-04-17 DIAGNOSIS — Z9889 Other specified postprocedural states: Secondary | ICD-10-CM

## 2020-04-17 DIAGNOSIS — M2042 Other hammer toe(s) (acquired), left foot: Secondary | ICD-10-CM

## 2020-04-17 NOTE — Progress Notes (Signed)
   Subjective:  Patient presents today status post fourth toe arthroplasty left foot. DOS: 03/29/2020.  Patient states that she is doing well.  She has ambulated in regular shoes.  There had a concern because of still 1 stitch left.  No acute complaint digits 1 have been removed  Past Medical History:  Diagnosis Date  . Anxiety   . Hypertension   . 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 Mental retardation   . Seizures ([facility])    none in 20 years or more  . Seizures ([facility])    years ago; more than 20 years ago      Objective/Physical Exam Neurovascular status intact.  Skin incisions appear to be well coapted with sutures intact. No sign of infectious process noted. No dehiscence. No active bleeding noted.  Negative for any significant edema noted to the surgical extremity.  Assessment: 1. s/p fourth toe arthroplasty left foot. DOS: 03/29/2020   Plan of Care:  1. Patient was evaluated.  1 left lower suture was removed today 2.  Patient may transition out of the surgical shoe into good supportive sneakers 3.  Patient may slowly resume full activity no restrictions 4.  Return to clinic as needed

## 2020-04-17 NOTE — Progress Notes (Signed)
   Subjective:  Patient presents today status post fourth toe arthroplasty left foot. DOS: 03/29/2020.  Patient states that she is doing well.  She has been weightbearing in the surgical shoe as instructed.  She presents for further treatment  Past Medical History:  Diagnosis Date  . Anxiety   . Hypertension   . 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 Mental retardation   . Seizures ([place])    none in 20 years or more  . Seizures ([place])    years ago; more than 20 years ago      Objective/Physical Exam Neurovascular status intact.  Skin incisions appear to be well coapted with sutures intact. No sign of infectious process noted. No dehiscence. No active bleeding noted.  Negative for any significant edema noted to the surgical extremity.  Assessment: 1. s/p fourth toe arthroplasty left foot. DOS: 03/29/2020   Plan of Care:  1. Patient was evaluated.  Sutures removed today 2.  Patient may transition out of the surgical shoe into good supportive sneakers 3.  Patient may slowly resume full activity no restrictions 4.  Return to clinic as needed   Edrick Kins, DPM [facility]  Dr. Edrick Kins, DPM    [address], [place] [address]                Office [phone]  Fax [phone]

## 2020-04-18 ENCOUNTER — Telehealth: Payer: Self-pay | Admitting: Podiatry

## 2020-04-18 ENCOUNTER — Other Ambulatory Visit: Payer: Self-pay | Admitting: Podiatry

## 2020-04-18 MED ORDER — HYDROCODONE-ACETAMINOPHEN 5-325 MG PO TABS: 1.0000 | ORAL_TABLET | Freq: Four times a day (QID) | ORAL | 0 refills | Status: DC | PRN

## 2020-04-18 NOTE — Telephone Encounter (Signed)
Patient is still having pain, she would like a pain medication refill.

## 2020-04-18 NOTE — Progress Notes (Signed)
PRN pain 

## 2020-04-19 NOTE — Telephone Encounter (Signed)
Done. Dr. Jovannie Ulibarri

## 2020-04-23 NOTE — Progress Notes (Signed)
   Subjective:  Patient presents today status post left fourth toe arthroplasty. DOS: 03/29/2020.  Patient states that she is doing well.  No concerns at this time.  She is kept the dressings clean dry and intact.  Past Medical History:  Diagnosis Date  . Anxiety   . Hypertension   . 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 Mental retardation   . Seizures ([facility])    none in 20 years or more  . Seizures ([facility])    years ago; more than 20 years ago      Objective/Physical Exam Neurovascular status intact.  Skin incisions appear to be well coapted with sutures and staples intact. No sign of infectious process noted. No dehiscence. No active bleeding noted. Moderate edema noted to the surgical extremity.  Radiographic Exam:  Osteotomies sites appear to be stable with routine healing.  Absence of the head of the proximal phalanx of the fourth toe noted.  Clean osteotomy site.  Assessment: 1. s/p fourth toe arthroplasty left. DOS: 03/29/2020   Plan of Care:  1. Patient was evaluated. X-rays reviewed 2.  Dressings changed today. 3.  Patient may begin showering and washing and getting the foot wet.  Recommend antibiotic ointment and a Band-Aid daily. 4.  Return to clinic in 1 week for suture removal   Felecia Shelling, DPM [facility]  Dr. Felecia Shelling, DPM    [address] [place], [place] [address]                Office [phone]  Fax [phone]

## 2020-04-24 ENCOUNTER — Encounter: Payer: Medicaid Other | Admitting: Podiatry

## 2020-05-01 ENCOUNTER — Encounter: Payer: Medicaid Other | Admitting: Podiatry

## 2020-05-03 ENCOUNTER — Ambulatory Visit (INDEPENDENT_AMBULATORY_CARE_PROVIDER_SITE_OTHER): Payer: Medicaid Other | Admitting: Podiatry

## 2020-05-03 ENCOUNTER — Other Ambulatory Visit: Payer: Self-pay

## 2020-05-03 DIAGNOSIS — M2042 Other hammer toe(s) (acquired), left foot: Secondary | ICD-10-CM

## 2020-05-03 DIAGNOSIS — Z9889 Other specified postprocedural states: Secondary | ICD-10-CM

## 2020-05-03 MED ORDER — HYDROCODONE-ACETAMINOPHEN 5-325 MG PO TABS: 1.0000 | ORAL_TABLET | Freq: Four times a day (QID) | ORAL | 0 refills | Status: DC | PRN

## 2020-05-03 NOTE — Progress Notes (Signed)
   Subjective:  Patient presents today status post fourth toe arthroplasty left foot. DOS: 03/29/2020.  Patient states that she is doing well.  She has been weightbearing in tennis shoes.  She states that when she is very active she has some residual pain.  She presents for further treatment  Past Medical History:  Diagnosis Date  . Anxiety   . Hypertension   . 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 Mental retardation   . Seizures ([place])    none in 20 years or more  . Seizures ([place])    years ago; more than 20 years ago      Objective/Physical Exam Neurovascular status intact.  Skin incisions appear to be well coapted with sutures intact. No sign of infectious process noted. No dehiscence. No active bleeding noted.  Negative for any significant edema noted to the surgical extremity.  Assessment: 1. s/p fourth toe arthroplasty left foot. DOS: 03/29/2020   Plan of Care:  1. Patient was evaluated.  Sutures removed today 2.  Patient may transition out of the surgical shoe into good supportive sneakers 3.  Patient may slowly resume full activity no restrictions 4. refill prescription for Vicodin 5/3 2 5  mg 5.  Return to clinic as needed   Edrick Kins, DPM [facility]  Dr. Edrick Kins, DPM    [address], [place] [address]                Office [phone]  Fax [phone]

## 2020-05-08 ENCOUNTER — Telehealth: Payer: Self-pay | Admitting: Podiatry

## 2020-05-08 NOTE — Telephone Encounter (Signed)
Patient uncle, Brandy Galloway, called stating the patient has still been experiencing pain to the surgical toe. There is some mild swelling but good color and she can move the toe. She takes pain medication. She has not been icing or elevating. Recommended to ice the foot daily and she can take ibuprofen (if able) in between the pain medication as needed. If pain continue to follow up this week.   Trula Slade

## 2020-05-11 ENCOUNTER — Other Ambulatory Visit: Payer: Self-pay

## 2020-05-11 ENCOUNTER — Ambulatory Visit (INDEPENDENT_AMBULATORY_CARE_PROVIDER_SITE_OTHER): Payer: Medicaid Other | Admitting: Podiatry

## 2020-05-11 ENCOUNTER — Ambulatory Visit (INDEPENDENT_AMBULATORY_CARE_PROVIDER_SITE_OTHER): Payer: Medicaid Other

## 2020-05-11 ENCOUNTER — Telehealth: Payer: Self-pay | Admitting: Podiatry

## 2020-05-11 DIAGNOSIS — Z9889 Other specified postprocedural states: Secondary | ICD-10-CM

## 2020-05-11 DIAGNOSIS — M2042 Other hammer toe(s) (acquired), left foot: Secondary | ICD-10-CM

## 2020-05-11 MED ORDER — LIDOCAINE 4 % EX CREA: 1.0000 "application " | TOPICAL_CREAM | CUTANEOUS | 0 refills | Status: DC | PRN

## 2020-05-11 NOTE — Telephone Encounter (Signed)
Pt guardian [name] left a message on the general mailbox stating that Ms. Brandy Galloway is still in a lot of pain. He stated he did do exactly what the doctor told him to do about putting ice packs on her foot. He wants to know what else he can do to stop the pain. Please advise

## 2020-05-11 NOTE — Telephone Encounter (Signed)
Yes, please have her come in for f/u xray and visit.

## 2020-05-11 NOTE — Telephone Encounter (Signed)
Se should come in to be seen. He called again last night and I told him we would get her in today.

## 2020-05-12 NOTE — Telephone Encounter (Signed)
done

## 2020-05-14 NOTE — Progress Notes (Signed)
  Subjective:  Patient ID: Brandy Galloway, female    DOB: 05/26/1965,  MRN: [id number]  Chief Complaint  Patient presents with  . Routine Post Op    Post OP DOS 12.8.21  PT is having pain in her left foot 5th toe.    DOS: 03/29/20 Procedure: Left fourth toe arthroplasty  55 y.o. female returns for post-op check.  Here with her primary caregiver as well.  Says she has had aching throbbing pain in the toe  Review of Systems: Negative except as noted in the HPI. Denies N/V/F/Ch.   Objective:  There were no vitals filed for this visit. There is no height or weight on file to calculate BMI. Constitutional Well developed. Well nourished.  Vascular Foot warm and well perfused. Capillary refill normal to all digits.   Neurologic Normal speech. Oriented to person, place, and time. Epicritic sensation to light touch grossly present bilaterally.  Dermatologic  well-healed surgical incision of the fourth toe  Orthopedic:  Unable to reproduce any painful symptoms   Radiographs: Status post left fourth toe arthroplasty Assessment:   1. Status post left foot surgery    Plan:  Patient was evaluated and treated and all questions answered.  S/p foot surgery left -Progressing as expected post-operatively.  Unable to appreciate or reproduce any of her symptoms today.  This is expected postoperative pain.  Expected to continue to prove.  I offered lidocaine cream as a treatments and sent this to her pharmacy   Return in about 6 weeks (around 06/22/2020).

## 2020-05-26 ENCOUNTER — Telehealth: Payer: Self-pay | Admitting: Podiatry

## 2020-05-26 NOTE — Telephone Encounter (Signed)
Pt's guardian called and stated he picked up the lidocaine 4% cream and that its still not helping the pt with pain. States pt is still hurting and is hollering in pain.

## 2020-05-27 MED ORDER — HYDROCODONE-ACETAMINOPHEN 5-325 MG PO TABS: 1.0000 | ORAL_TABLET | Freq: Four times a day (QID) | ORAL | 0 refills | Status: DC | PRN

## 2020-05-27 NOTE — Addendum Note (Signed)
Addended by: Boneta Lucks on: 05/27/2020 09:08 PM   Modules accepted: Orders

## 2020-05-31 ENCOUNTER — Ambulatory Visit (INDEPENDENT_AMBULATORY_CARE_PROVIDER_SITE_OTHER): Payer: Medicaid Other | Admitting: Podiatry

## 2020-05-31 ENCOUNTER — Other Ambulatory Visit: Payer: Self-pay

## 2020-05-31 DIAGNOSIS — Z9889 Other specified postprocedural states: Secondary | ICD-10-CM

## 2020-05-31 NOTE — Progress Notes (Signed)
   Subjective:  Patient presents today status post fourth toe arthroplasty left foot. DOS: 03/29/2020.  Patient states that she is doing well.  She has been weightbearing in tennis shoes. No new complaints at this time  Past Medical History:  Diagnosis Date  . Anxiety   . Hypertension   . 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 Mental retardation   . Seizures ([place])    none in 20 years or more  . Seizures ([place])    years ago; more than 20 years ago      Objective/Physical Exam Neurovascular status intact.  Skin incisions appear to be well coapted and healed. No sign of infectious process noted. No dehiscence. No active bleeding noted.  Negative for any significant edema noted to the surgical extremity.  Today there is no pain with palpation or range of motion to the toe either.   Assessment: 1. s/p fourth toe arthroplasty left foot. DOS: 03/29/2020   Plan of Care:  1. Patient was evaluated.   2.  Patient may continue full activity no restrictions 3. Continue topical lidocaine cream PRN  4.  Return to clinic as needed   Edrick Kins, DPM [facility]  Dr. Edrick Kins, DPM    [address], [place] [address]                Office [phone]  Fax [phone]

## 2020-06-15 ENCOUNTER — Ambulatory Visit: Payer: Medicaid Other | Admitting: Obstetrics and Gynecology

## 2020-06-15 NOTE — Progress Notes (Deleted)
Spoke with caregiver over the phone. He reports complete resolution of vaginal bleeding. He reports that patient experienced a few days of vaginal spotting. It was very light and occurred a few months ago. Patient never complained of pain. It was something he noticed on her under garments. He reports purchasing panty liners and never saw any further episodes of vaginal spotting.  Appointment cancelled per his request.  He was advised to return to the office for evaluation if bleeding returned.

## 2020-06-26 ENCOUNTER — Ambulatory Visit: Payer: Medicaid Other | Admitting: Podiatry

## 2020-07-24 DIAGNOSIS — R41841 Cognitive communication deficit: Secondary | ICD-10-CM | POA: Diagnosis not present

## 2020-07-24 DIAGNOSIS — E782 Mixed hyperlipidemia: Secondary | ICD-10-CM | POA: Diagnosis not present

## 2020-07-24 DIAGNOSIS — I10 Essential (primary) hypertension: Secondary | ICD-10-CM | POA: Diagnosis not present

## 2020-07-24 DIAGNOSIS — Z7189 Other specified counseling: Secondary | ICD-10-CM | POA: Diagnosis not present

## 2020-08-21 DIAGNOSIS — M48061 Spinal stenosis, lumbar region without neurogenic claudication: Secondary | ICD-10-CM | POA: Diagnosis not present

## 2020-08-21 DIAGNOSIS — M4316 Spondylolisthesis, lumbar region: Secondary | ICD-10-CM | POA: Diagnosis not present

## 2020-08-21 DIAGNOSIS — I10 Essential (primary) hypertension: Secondary | ICD-10-CM | POA: Diagnosis not present

## 2020-08-21 DIAGNOSIS — Z6829 Body mass index (BMI) 29.0-29.9, adult: Secondary | ICD-10-CM | POA: Diagnosis not present

## 2020-08-23 ENCOUNTER — Other Ambulatory Visit: Payer: Self-pay | Admitting: Neurosurgery

## 2020-08-23 DIAGNOSIS — M4316 Spondylolisthesis, lumbar region: Secondary | ICD-10-CM

## 2020-09-08 ENCOUNTER — Ambulatory Visit
Admission: RE | Admit: 2020-09-08 | Discharge: 2020-09-08 | Disposition: A | Discharge status: Home / Self Care | Payer: Medicaid Other | Source: Ambulatory Visit | Attending: Neurosurgery | Admitting: Neurosurgery

## 2020-09-08 DIAGNOSIS — M4316 Spondylolisthesis, lumbar region: Secondary | ICD-10-CM

## 2020-09-11 ENCOUNTER — Other Ambulatory Visit: Payer: Medicaid Other

## 2020-09-11 ENCOUNTER — Other Ambulatory Visit: Payer: Self-pay | Admitting: Neurosurgery

## 2020-09-11 DIAGNOSIS — M4316 Spondylolisthesis, lumbar region: Secondary | ICD-10-CM

## 2020-09-13 ENCOUNTER — Ambulatory Visit
Admission: RE | Admit: 2020-09-13 | Discharge: 2020-09-13 | Disposition: A | Discharge status: Home / Self Care | Payer: Medicaid Other | Source: Ambulatory Visit | Attending: Neurosurgery | Admitting: Neurosurgery

## 2020-09-13 ENCOUNTER — Other Ambulatory Visit: Payer: Self-pay

## 2020-09-13 DIAGNOSIS — M4316 Spondylolisthesis, lumbar region: Secondary | ICD-10-CM

## 2020-09-14 ENCOUNTER — Other Ambulatory Visit (HOSPITAL_COMMUNITY): Payer: Self-pay | Admitting: Neurosurgery

## 2020-09-14 DIAGNOSIS — M4316 Spondylolisthesis, lumbar region: Secondary | ICD-10-CM

## 2020-09-20 ENCOUNTER — Telehealth (HOSPITAL_COMMUNITY): Payer: Self-pay

## 2020-10-16 ENCOUNTER — Encounter (HOSPITAL_COMMUNITY): Payer: Self-pay | Admitting: Neurosurgery

## 2020-10-16 ENCOUNTER — Other Ambulatory Visit: Payer: Self-pay

## 2020-10-16 NOTE — Progress Notes (Signed)
Spoke with [facility] (cell [phone]) for PAT information and instructions for DOS.  DUE TO COVID-19 ONLY ONE VISITOR IS ALLOWED TO COME WITH YOU AND STAY IN THE WAITING ROOM ONLY DURING PRE OP AND PROCEDURE DAY OF SURGERY.   PCP - Jordan Hawks, NP @ [facility] Cardiologist - n/a  Chest x-ray - n/a EKG - 01/12/20 Stress Test - n/a ECHO - n/a Cardiac Cath - n/a  Sleep Study -  n/a CPAP - none  Anesthesia review: Yes  STOP now taking any Aspirin (unless otherwise instructed by your surgeon), Aleve, Naproxen, Ibuprofen, Motrin, Advil, Goody's, BC's, all herbal medications, fish oil, and all vitamins.   Coronavirus Screening Covid test on DOS Do you have any of the following symptoms:  Cough yes/no: No Fever (>100.28F)  yes/no: No Runny nose yes/no: No Sore throat yes/no: No Difficulty breathing/shortness of breath  yes/no: No  Have you traveled in the last 14 days and where? yes/no: No  Patient's Uncle [name] verbalized understanding of instructions that were given via phone.

## 2020-10-17 ENCOUNTER — Ambulatory Visit (HOSPITAL_COMMUNITY): Payer: Medicaid Other | Admitting: Emergency Medicine

## 2020-10-17 ENCOUNTER — Ambulatory Visit (HOSPITAL_COMMUNITY)
Admission: RE | Admit: 2020-10-17 | Discharge: 2020-10-17 | Disposition: A | Discharge status: Home / Self Care | Payer: Medicaid Other | Source: Ambulatory Visit | Attending: Neurosurgery | Admitting: Neurosurgery

## 2020-10-17 ENCOUNTER — Encounter (HOSPITAL_COMMUNITY): Payer: Self-pay | Admitting: Neurosurgery

## 2020-10-17 ENCOUNTER — Ambulatory Visit (HOSPITAL_COMMUNITY)
Admission: RE | Admit: 2020-10-17 | Discharge: 2020-10-17 | Disposition: A | Discharge status: Home / Self Care | Payer: Medicaid Other | Attending: Neurosurgery | Admitting: Neurosurgery

## 2020-10-17 ENCOUNTER — Encounter (HOSPITAL_COMMUNITY)
Admission: RE | Disposition: A | Discharge status: Home / Self Care | Payer: Self-pay | Source: Home / Self Care | Attending: Neurosurgery

## 2020-10-17 DIAGNOSIS — F79 Unspecified intellectual disabilities: Secondary | ICD-10-CM | POA: Diagnosis not present

## 2020-10-17 DIAGNOSIS — M5116 Intervertebral disc disorders with radiculopathy, lumbar region: Secondary | ICD-10-CM | POA: Diagnosis not present

## 2020-10-17 DIAGNOSIS — Z8669 Personal history of other diseases of the nervous system and sense organs: Secondary | ICD-10-CM | POA: Diagnosis not present

## 2020-10-17 DIAGNOSIS — M4316 Spondylolisthesis, lumbar region: Secondary | ICD-10-CM

## 2020-10-17 DIAGNOSIS — M48062 Spinal stenosis, lumbar region with neurogenic claudication: Secondary | ICD-10-CM | POA: Diagnosis not present

## 2020-10-17 DIAGNOSIS — I10 Essential (primary) hypertension: Secondary | ICD-10-CM | POA: Insufficient documentation

## 2020-10-17 DIAGNOSIS — M545 Low back pain, unspecified: Secondary | ICD-10-CM | POA: Diagnosis not present

## 2020-10-17 DIAGNOSIS — F419 Anxiety disorder, unspecified: Secondary | ICD-10-CM | POA: Diagnosis not present

## 2020-10-17 DIAGNOSIS — M48061 Spinal stenosis, lumbar region without neurogenic claudication: Secondary | ICD-10-CM | POA: Insufficient documentation

## 2020-10-17 HISTORY — PX: RADIOLOGY WITH ANESTHESIA: SHX6223

## 2020-10-17 LAB — BASIC METABOLIC PANEL
Anion gap: 14 (ref 5–15)
BUN: 17 mg/dL (ref 6–20)
CO2: 22 mmol/L (ref 22–32)
Calcium: 9.5 mg/dL (ref 8.9–10.3)
Chloride: 102 mmol/L (ref 98–111)
Creatinine, Ser: 0.92 mg/dL (ref 0.44–1.00)
GFR, Estimated: >60 mL/min (ref >60)
Glucose, Bld: 109 mg/dL — ABNORMAL HIGH (ref 70–99)
Potassium: 3.2 mmol/L — ABNORMAL LOW (ref 3.5–5.1)
Sodium: 138 mmol/L (ref 135–145)

## 2020-10-17 SURGERY — MRI WITH ANESTHESIA
Anesthesia: Monitor Anesthesia Care

## 2020-10-17 MED ORDER — CHLORHEXIDINE GLUCONATE 0.12 % MT SOLN
15.0000 mL | Freq: Once | OROMUCOSAL | Status: AC
  Administered 2020-10-17: 15 mL via OROMUCOSAL
  Filled 2020-10-17: qty 15

## 2020-10-17 MED ORDER — PROPOFOL 10 MG/ML IV BOLUS
INTRAVENOUS | Status: DC | PRN
  Administered 2020-10-17: 10 mg via INTRAVENOUS

## 2020-10-17 MED ORDER — ONDANSETRON HCL 4 MG/2ML IJ SOLN: 4.0000 mg | Freq: Once | INTRAMUSCULAR | Status: DC | PRN

## 2020-10-17 MED ORDER — LIDOCAINE 2% (20 MG/ML) 5 ML SYRINGE
INTRAMUSCULAR | Status: DC | PRN
  Administered 2020-10-17: 60 mg via INTRAVENOUS

## 2020-10-17 MED ORDER — GADOBUTROL 1 MMOL/ML IV SOLN
7.5000 mL | Freq: Once | INTRAVENOUS | Status: AC | PRN
  Administered 2020-10-17: 7.5 mL via INTRAVENOUS

## 2020-10-17 MED ORDER — FENTANYL CITRATE (PF) 100 MCG/2ML IJ SOLN
25.0000 ug | INTRAMUSCULAR | Status: DC | PRN
  Administered 2020-10-17 (×4): 50 ug via INTRAVENOUS

## 2020-10-17 MED ORDER — HYDRALAZINE HCL 20 MG/ML IJ SOLN
10.0000 mg | Freq: Once | INTRAMUSCULAR | Status: AC
  Administered 2020-10-17: 10 mg via INTRAVENOUS
  Filled 2020-10-17: qty 0.5
  Filled 2020-10-17: qty 1

## 2020-10-17 MED ORDER — LACTATED RINGERS IV SOLN: INTRAVENOUS | Status: DC

## 2020-10-17 MED ORDER — LABETALOL HCL 5 MG/ML IV SOLN
10.0000 mg | Freq: Once | INTRAVENOUS | Status: DC
  Filled 2020-10-17: qty 4

## 2020-10-17 MED ORDER — OXYCODONE HCL 5 MG/5ML PO SOLN: 5.0000 mg | Freq: Once | ORAL | Status: DC | PRN

## 2020-10-17 MED ORDER — LABETALOL HCL 5 MG/ML IV SOLN
INTRAVENOUS | Status: AC
  Filled 2020-10-17: qty 4

## 2020-10-17 MED ORDER — DEXMEDETOMIDINE (PRECEDEX) IN NS 20 MCG/5ML (4 MCG/ML) IV SYRINGE
PREFILLED_SYRINGE | INTRAVENOUS | Status: DC | PRN
  Administered 2020-10-17 (×5): 4 ug via INTRAVENOUS

## 2020-10-17 MED ORDER — ORAL CARE MOUTH RINSE
15.0000 mL | Freq: Once | OROMUCOSAL | Status: AC
Start: 2020-10-17 — End: 2020-10-17

## 2020-10-17 MED ORDER — MIDAZOLAM HCL 5 MG/5ML IJ SOLN
INTRAMUSCULAR | Status: DC | PRN
  Administered 2020-10-17: 2 mg via INTRAVENOUS

## 2020-10-17 MED ORDER — AMISULPRIDE (ANTIEMETIC) 5 MG/2ML IV SOLN: 10.0000 mg | Freq: Once | INTRAVENOUS | Status: DC | PRN

## 2020-10-17 MED ORDER — OXYCODONE HCL 5 MG PO TABS: 5.0000 mg | ORAL_TABLET | Freq: Once | ORAL | Status: DC | PRN

## 2020-10-17 NOTE — Progress Notes (Signed)
Notified Dr. Christella Hartigan of pt's blood pressure 232/96,Heart rate 68. Also notified Dr. Christella Hartigan that pt's K+ was 3.2.10mg  Apresoline IV ordered and administered at 0758. BP at 0825 was 229/94, heart rate 73. Dr. Christella Hartigan notified. Dr. Christella Hartigan at the bedside to see patient. After discussion between Dr. Christella Hartigan and pt's guardian ,Curtis Sites was made to proceed with MRI. Dr. Christella Hartigan ordered 10 mg Labetalol IV, but as I was about to administer the Labetalol, April Carter CRNA came to room and said to hold Labetalol for now that she was going to take pt to MRI now. Pt transferred to MRI.

## 2020-10-17 NOTE — Anesthesia Postprocedure Evaluation (Signed)
Anesthesia Post Note  Patient: Brandy Galloway  Procedure(s) Performed: MRI WITH ANESTHESIA LUMBAR SPINE WITH AND WITHOUT CONTRAST     Patient location during evaluation: PACU Anesthesia Type: MAC Level of consciousness: awake and alert Pain management: pain level controlled Vital Signs Assessment: post-procedure vital signs reviewed and stable Respiratory status: spontaneous breathing, nonlabored ventilation and respiratory function stable Cardiovascular status: blood pressure returned to baseline and stable Postop Assessment: no apparent nausea or vomiting Anesthetic complications: no   No notable events documented.  Last Vitals:  Vitals:   10/17/20 1033 10/17/20 1048  BP: (!) 179/93 (!) 191/89  Pulse: 76 84  Resp: 14 15  Temp:  (!) 36.3 C  SpO2: 100% 98%    Last Pain:  Vitals:   10/17/20 0650  TempSrc: Oral                 Lidia Collum

## 2020-10-17 NOTE — Anesthesia Preprocedure Evaluation (Addendum)
Anesthesia Evaluation  Patient identified by MRN, date of birth, ID band Patient awake    Reviewed: Allergy & Precautions, NPO status , Patient's Chart, lab work & pertinent test results, reviewed documented beta blocker date and time   History of Anesthesia Complications Negative for: history of anesthetic complications  Airway Mallampati: III  TM Distance: >3 FB Neck ROM: Full    Dental  (+) Edentulous Upper, Poor Dentition, Missing   Pulmonary neg pulmonary ROS,    Pulmonary exam normal        Cardiovascular hypertension, Pt. on medications and Pt. on home beta blockers Normal cardiovascular exam     Neuro/Psych Seizures -,  Developmental delay    GI/Hepatic negative GI ROS, Neg liver ROS,   Endo/Other  negative endocrine ROS  Renal/GU negative Renal ROS  negative genitourinary   Musculoskeletal negative musculoskeletal ROS (+)   Abdominal   Peds  Hematology negative hematology ROS (+)   Anesthesia Other Findings   Reproductive/Obstetrics                           Anesthesia Physical Anesthesia Plan  ASA: 3  Anesthesia Plan: MAC   Post-op Pain Management:    Induction: Intravenous  PONV Risk Score and Plan: 2 and Treatment may vary due to age or medical condition and Midazolam  Airway Management Planned: Natural Airway, Nasal Cannula and Simple Face Mask  Additional Equipment: None  Intra-op Plan:   Post-operative Plan:   Informed Consent: I have reviewed the patients History and Physical, chart, labs and discussed the procedure including the risks, benefits and alternatives for the proposed anesthesia with the patient or authorized representative who has indicated his/her understanding and acceptance.     Consent reviewed with POA  Plan Discussed with:   Anesthesia Plan Comments: (Patient has severe hypertension despite taking all of her normal meds and IV medication  in short stay. Her guardian states that she has successfully had some MRIs done in the past without anesthesia. We discussed proceeding with light sedation. If she is unable to tolerate MRI with light sedation, we will have her see her PCP for better blood pressure control and return for general anesthesia.)       Anesthesia Quick Evaluation

## 2020-10-17 NOTE — Transfer of Care (Signed)
Immediate Anesthesia Transfer of Care Note  Patient: Brandy Galloway  Procedure(s) Performed: MRI WITH ANESTHESIA LUMBAR SPINE WITH AND WITHOUT CONTRAST  Patient Location: PACU  Anesthesia Type:MAC  Level of Consciousness: awake  Airway & Oxygen Therapy: Patient Spontanous Breathing and Patient connected to nasal cannula oxygen  Post-op Assessment: Report given to RN and Post -op Vital signs reviewed and stable  Post vital signs: Reviewed and stable  Last Vitals:  Vitals Value Taken Time  BP 157/91 10/17/20 1018  Temp 36.1 C 10/17/20 1018  Pulse 81 10/17/20 1023  Resp 15 10/17/20 1023  SpO2 100 % 10/17/20 1023  Vitals shown include unvalidated device data.  Last Pain:  Vitals:   10/17/20 0650  TempSrc: Oral         Complications: No notable events documented.

## 2020-10-18 ENCOUNTER — Encounter (HOSPITAL_COMMUNITY): Payer: Self-pay | Admitting: Radiology

## 2020-10-24 DIAGNOSIS — M4316 Spondylolisthesis, lumbar region: Secondary | ICD-10-CM | POA: Diagnosis not present

## 2020-11-07 ENCOUNTER — Other Ambulatory Visit: Payer: Self-pay | Admitting: Neurosurgery

## 2020-11-09 NOTE — H&P (Signed)
Mrs. Brandy Galloway has low back pain. She is unable to remain still for a lumbar MRI. She is admitted for a general anesthetic to perform the mri.  No Known Allergies Past Medical History:  Diagnosis Date   Anxiety    Hypertension    LSIL (low grade squamous intraepithelial lesion) on Pap smear 07/12/2011   Mental retardation    Seizures (Brandy Galloway)    last one more than 20 yrs ago   Seizures (Brandy Galloway)    years ago; more than 20 years ago   Past Surgical History:  Procedure Laterality Date   CERVICAL CONE BIOPSY  2009   CERVICAL CONIZATION W/BX  08/20/2011   Procedure: CONIZATION CERVIX WITH BIOPSY;  Surgeon: Emily Filbert, MD;  Location: [facility];  Service: Gynecology;  Laterality: N/A;  With Endocervical Currettage   COLPOSCOPY  06/2011   COLPOSCOPY N/A 05/28/2016   Procedure: COLPOSCOPY;  Surgeon: Mora Bellman, MD;  Location: [facility];  Service: Gynecology;  Laterality: N/A;   DILATION AND CURETTAGE OF UTERUS N/A 05/28/2016   Procedure: DILATATION AND CURETTAGE;  Surgeon: Mora Bellman, MD;  Location: [facility];  Service: Gynecology;  Laterality: N/A;   LEEP  2009   LUMBAR LAMINECTOMY/DECOMPRESSION MICRODISCECTOMY N/A 03/31/2019   Procedure: LAMINECTOMY LUMBAR THREE- LUMBAR FOUR, LUMBAR FOUR- LUMBAR FIVE;  Surgeon: Ashok Pall, MD;  Location: [place];  Service: Neurosurgery;  Laterality: N/A;  LAMINECTOMY LUMBAR THREE- LUMBAR FOUR, LUMBAR FOUR- LUMBAR FIVE   RADIOLOGY WITH ANESTHESIA N/A 10/17/2020   Procedure: MRI WITH ANESTHESIA LUMBAR SPINE WITH AND WITHOUT CONTRAST;  Surgeon: Radiologist, Medication, MD;  Location: [place];  Service: Radiology;  Laterality: N/A;   Family History  Problem Relation Age of Onset   Diabetes Father    Diabetes Mother    Social History   Socioeconomic History   Marital status: Single    Spouse name: Not on file   Number of children: Not on file   Years of education: Not on file   Highest education level: Not on file  Occupational History   Not on file  Tobacco Use    Smoking status: Never   Smokeless tobacco: Never  Vaping Use   Vaping Use: Never used  Substance and Sexual Activity   Alcohol use: No   Drug use: No   Sexual activity: Never    Birth control/protection: None  Other Topics Concern   Not on file  Social History Narrative   Not on file   Social Determinants of Health   Financial Resource Strain: Not on file  Food Insecurity: Not on file  Transportation Needs: Not on file  Physical Activity: Not on file  Stress: Not on file  Social Connections: Not on file  Intimate Partner Violence: Not on file   Physical Exam Constitutional:      Appearance: Normal appearance.  HENT:     Head: Normocephalic and atraumatic.     Right Ear: External ear normal.     Left Ear: External ear normal.     Nose: Nose normal.     Mouth/Throat:     Pharynx: Oropharynx is clear.  Eyes:     Extraocular Movements: Extraocular movements intact.     Pupils: Pupils are equal, round, and reactive to light.  Abdominal:     General: Abdomen is flat.  Neurological:     Mental Status: She is alert.     Cranial Nerves: Cranial nerves are intact.     Sensory: Sensation is intact.  Motor: Motor function is intact.     Coordination: Coordination is intact.  Psychiatric:     Comments: Mentally challenged. Speech is minimal but will speak. Follows all commands   Admit for general anesthetic and MRI lumbar spine

## 2020-11-14 NOTE — Pre-Procedure Instructions (Addendum)
Surgical Instructions    Your procedure is scheduled on Wednesday 11/22/2020.   Report to [facility] Main Entrance "A" at 11:00 A.M., then check in with the Admitting office.  Call this number if you have problems the morning of surgery:  [phone]   If you have any questions prior to your surgery date call [phone]: Open Monday-Friday 8am-4pm    Remember:  Do not eat after midnight the night before your surgery  You may drink clear liquids until 10:00 A.M. the morning of your surgery.   Clear liquids allowed are: Water, Non-Citrus Juices (without pulp), Carbonated Beverages, Clear Tea, Black Coffee Only, and Gatorade    Take these medicines the morning of surgery with A SIP OF WATER   metoprolol tartrate (LOPRESSOR)  hydrALAZINE (APRESOLINE)  acetaminophen (TYLENOL)- If needed  HYDROcodone-acetaminophen (NORCO)- If needed    As of today, STOP taking any Aspirin (unless otherwise instructed by your surgeon) Aleve, Naproxen, Ibuprofen, Motrin, Advil, Goody's, BC's, all herbal medications, fish oil, and all vitamins.                     Do NOT Smoke (Tobacco/Vaping) or drink Alcohol 24 hours prior to your procedure.  If you use a CPAP at night, you may bring all equipment for your overnight stay.   Contacts, glasses, piercing's, hearing aid's, dentures or partials may not be worn into surgery, please bring cases for these belongings.    For patients admitted to the hospital, discharge time will be determined by your treatment team.   Patients discharged the day of surgery will not be allowed to drive home, and someone needs to stay with them for 24 hours.  ONLY 1 SUPPORT PERSON MAY BE PRESENT WHILE YOU ARE IN SURGERY. IF YOU ARE TO BE ADMITTED ONCE YOU ARE IN YOUR ROOM YOU WILL BE ALLOWED TWO (2) VISITORS.  Minor children may have two parents present. Special consideration for safety and communication needs will be reviewed on a case by case basis.   Special instructions:    [facility]- Preparing For Surgery  Before surgery, you can play an important role. Because skin is not sterile, your skin needs to be as free of germs as possible. You can reduce the number of germs on your skin by washing with CHG (chlorahexidine gluconate) Soap before surgery.  CHG is an antiseptic cleaner which kills germs and bonds with the skin to continue killing germs even after washing.    Oral Hygiene is also important to reduce your risk of infection.  Remember - BRUSH YOUR TEETH THE MORNING OF SURGERY WITH YOUR REGULAR TOOTHPASTE  Please do not use if you have an allergy to CHG or antibacterial soaps. If your skin becomes reddened/irritated stop using the CHG.  Do not shave (including legs and underarms) for at least 48 hours prior to first CHG shower. It is OK to shave your face.  Please follow these instructions carefully.   Shower the NIGHT BEFORE SURGERY and the MORNING OF SURGERY  If you chose to wash your hair, wash your hair first as usual with your normal shampoo.  After you shampoo, rinse your hair and body thoroughly to remove the shampoo.  Use CHG Soap as you would any other liquid soap. You can apply CHG directly to the skin and wash gently with a scrungie or a clean washcloth.   Apply the CHG Soap to your body ONLY FROM THE NECK DOWN.  Do not use on open wounds  or open sores. Avoid contact with your eyes, ears, mouth and genitals (private parts). Wash Face and genitals (private parts)  with your normal soap.   Wash thoroughly, paying special attention to the area where your surgery will be performed.  Thoroughly rinse your body with warm water from the neck down.  DO NOT shower/wash with your normal soap after using and rinsing off the CHG Soap.  Pat yourself dry with a CLEAN TOWEL.  Wear CLEAN PAJAMAS to bed the night before surgery  Place CLEAN SHEETS on your bed the night before your surgery  DO NOT SLEEP WITH PETS.   Day of Surgery: Shower with CHG  soap. Do not wear jewelry, make up, nail polish, gel polish, artificial nails, or any other type of covering on natural nails including finger and toenails. If patients have artificial nails, gel coating, etc. that need to be removed by a nail salon please have this removed prior to surgery. Surgery may need to be canceled/delayed if the surgeon/ anesthesia feels like the patient is unable to be adequately monitored. Do not wear lotions, powders, perfumes/colognes, or deodorant. Do not shave 48 hours prior to surgery.  Men may shave face and neck. Do not bring valuables to the hospital. [facility] is not responsible for any belongings or valuables. Wear Clean/Comfortable clothing the morning of surgery Remember to brush your teeth WITH YOUR REGULAR TOOTHPASTE.   Please read over the following fact sheets that you were given.

## 2020-11-15 ENCOUNTER — Other Ambulatory Visit: Payer: Self-pay

## 2020-11-15 ENCOUNTER — Encounter (HOSPITAL_COMMUNITY)
Admission: RE | Admit: 2020-11-15 | Discharge: 2020-11-15 | Disposition: A | Discharge status: Home / Self Care | Payer: Medicaid Other | Source: Ambulatory Visit | Attending: Neurosurgery | Admitting: Neurosurgery

## 2020-11-15 ENCOUNTER — Encounter (HOSPITAL_COMMUNITY): Payer: Self-pay

## 2020-11-15 DIAGNOSIS — Z01812 Encounter for preprocedural laboratory examination: Secondary | ICD-10-CM | POA: Insufficient documentation

## 2020-11-15 LAB — CBC
HCT: 38.4 % (ref 36.0–46.0)
Hemoglobin: 12.2 g/dL (ref 12.0–15.0)
MCH: 29.3 pg (ref 26.0–34.0)
MCHC: 31.8 g/dL (ref 30.0–36.0)
MCV: 92.1 fL (ref 80.0–100.0)
Platelets: 274 10*3/uL (ref 150–400)
RBC: 4.17 MIL/uL (ref 3.87–5.11)
RDW: 12.8 % (ref 11.5–15.5)
WBC: 5.4 10*3/uL (ref 4.0–10.5)
nRBC: 0 % (ref 0.0–0.2)

## 2020-11-15 LAB — BASIC METABOLIC PANEL
Anion gap: 7 (ref 5–15)
BUN: 13 mg/dL (ref 6–20)
CO2: 25 mmol/L (ref 22–32)
Calcium: 9.5 mg/dL (ref 8.9–10.3)
Chloride: 108 mmol/L (ref 98–111)
Creatinine, Ser: 0.9 mg/dL (ref 0.44–1.00)
GFR, Estimated: >60 mL/min (ref >60)
Glucose, Bld: 109 mg/dL — ABNORMAL HIGH (ref 70–99)
Potassium: 3.2 mmol/L — ABNORMAL LOW (ref 3.5–5.1)
Sodium: 140 mmol/L (ref 135–145)

## 2020-11-15 LAB — SURGICAL PCR SCREEN
MRSA, PCR: NEGATIVE
Staphylococcus aureus: NEGATIVE

## 2020-11-15 NOTE — Progress Notes (Signed)
PCP - Dr. Lucianne Lei Cardiologist - Dr. Vernell Leep  PPM/ICD - n/a Device Orders - n/a Rep Notified - n/a  Chest x-ray - n/a EKG - 01/12/20 Stress Test - denies ECHO - denies Cardiac Cath - denies  Sleep Study - denies CPAP - n/a  Fasting Blood Sugar - n/a Checks Blood Sugar _ n/a_ times a day  Blood Thinner Instructions: n/a Aspirin Instructions: n/a  ERAS Protcol - n/a PRE-SURGERY Ensure or G2- n/a  COVID TEST- Patient's legal guardian is aware that patient needs a COVID test on 8/1 or 8/2 and knows the location is [place] and that no appointment is needed for drive-up testing.    Anesthesia review: Yes. BP 199/91 at PAT appointment. Patient's legal guardian states he did not give patient any of her BP meds or other meds this morning because he wasn't sure if she should take them prior to PAT appointment. Patient's guardian states he will give patient all meds when they get home. AUlice Brilliant, PA made aware of patient's BP reading.   Patient denies shortness of breath, fever, cough and chest pain at PAT appointment   All instructions explained to the patient, with a verbal understanding of the material. Patient agrees to go over the instructions while at home for a better understanding. Patient also instructed to self quarantine after being tested for COVID-19. The opportunity to ask questions was provided.

## 2020-11-16 NOTE — Anesthesia Preprocedure Evaluation (Addendum)
Anesthesia Evaluation  Patient identified by MRN, date of birth, ID band Patient confused    Reviewed: Allergy & Precautions, NPO status , Patient's Chart, lab work & pertinent test results  Airway Mallampati: II  TM Distance: >3 FB Neck ROM: Full    Dental  (+) Poor Dentition, Missing   Pulmonary neg pulmonary ROS,    Pulmonary exam normal        Cardiovascular hypertension, Pt. on medications and Pt. on home beta blockers  Rhythm:Regular Rate:Normal     Neuro/Psych Seizures - (last episode 20 years ago), Well Controlled,  Anxiety MR with caregiver at bedside    GI/Hepatic negative GI ROS, Neg liver ROS,   Endo/Other  negative endocrine ROS  Renal/GU negative Renal ROS  negative genitourinary   Musculoskeletal  (+) Arthritis , Osteoarthritis,  Spondylolisthesis    Abdominal (+)  Abdomen: soft.    Peds  Hematology negative hematology ROS (+)   Anesthesia Other Findings   Reproductive/Obstetrics                          Anesthesia Physical Anesthesia Plan  ASA: 3  Anesthesia Plan: General   Post-op Pain Management:    Induction: Intravenous  PONV Risk Score and Plan: 3 and Ondansetron, Dexamethasone and Treatment may vary due to age or medical condition  Airway Management Planned: Mask and Oral ETT  Additional Equipment: None  Intra-op Plan:   Post-operative Plan: Extubation in OR  Informed Consent: I have reviewed the patients History and Physical, chart, labs and discussed the procedure including the risks, benefits and alternatives for the proposed anesthesia with the patient or authorized representative who has indicated his/her understanding and acceptance.     Dental advisory given  Plan Discussed with: CRNA  Anesthesia Plan Comments: (DOS K 5.2 after first ISTAT sample likely hemolyzed with value >7  Lab Results      Component                Value                Date                      NA                       140                 11/15/2020                K                        3.2 (L)             11/15/2020                CO2                      25                  11/15/2020                GLUCOSE                  109 (H)             11/15/2020  BUN                      13                  11/15/2020                CREATININE               0.90                11/15/2020                CALCIUM                  9.5                 11/15/2020                GFRNONAA                 >60                 11/15/2020                GFRAA                    >60                 03/29/2019           PAT note by Karoline Caldwell, PA-C: BP elevated at PAT appointment, 199/91. Due to intellectual disability, pt has a caregiver Fish farm manager, who is pt's uncle). Caregiver reported he did not give pt her BP meds that morning (hydralazine and metoprolol) because he was not sure if she could take them prior to PAT appointment. Caregiver was provided with medication instructions, pt to take both BP meds on DOS. She was also to receive her usual dose after returning home from PAT appt.  Anticipate BP will be better controlled with meds on DOS.   Preop labs reviewed, unremarkable.   EKG 01/12/2020: Sinus rhythm 92 bpm. QTc within normal limits. Normal EKG  )      Anesthesia Quick Evaluation

## 2020-11-16 NOTE — Progress Notes (Signed)
Anesthesia Chart Review:  BP elevated at PAT appointment, 199/91. Due to intellectual disability, pt has a caregiver Fish farm manager, who is pt's uncle). Caregiver reported he did not give pt her BP meds that morning (hydralazine and metoprolol) because he was not sure if she could take them prior to PAT appointment. Caregiver was provided with medication instructions, pt to take both BP meds on DOS. She was also to receive her usual dose after returning home from PAT appt.  Anticipate BP will be better controlled with meds on DOS.   Preop labs reviewed, unremarkable.   EKG 01/12/2020: Sinus rhythm 92 bpm. QTc within normal limits. Normal EKG   Wynonia Musty [facility] Short Stay Center/Anesthesiology Phone [phone] 11/16/2020 10:57 AM

## 2020-11-21 LAB — TYPE AND SCREEN
ABO/RH(D): O POS
Antibody Screen: NEGATIVE

## 2020-11-21 NOTE — Progress Notes (Signed)
Spoke with patient's care giver [name] at 1800. He voiced understanding of new arrival time of 1230.

## 2020-11-22 ENCOUNTER — Inpatient Hospital Stay (HOSPITAL_COMMUNITY)
Admission: RE | Admit: 2020-11-22 | Discharge: 2020-11-23 | DRG: 460 | Disposition: A | Discharge status: Home / Self Care | Payer: Medicaid Other | Source: Ambulatory Visit | Attending: Neurosurgery | Admitting: Neurosurgery

## 2020-11-22 ENCOUNTER — Inpatient Hospital Stay (HOSPITAL_COMMUNITY): Payer: Medicaid Other

## 2020-11-22 ENCOUNTER — Encounter (HOSPITAL_COMMUNITY)
Admission: RE | Disposition: A | Discharge status: Home / Self Care | Payer: Self-pay | Source: Ambulatory Visit | Attending: Neurosurgery

## 2020-11-22 ENCOUNTER — Inpatient Hospital Stay (HOSPITAL_COMMUNITY): Payer: Medicaid Other | Admitting: Certified Registered Nurse Anesthetist

## 2020-11-22 ENCOUNTER — Inpatient Hospital Stay (HOSPITAL_COMMUNITY): Payer: Medicaid Other | Admitting: Vascular Surgery

## 2020-11-22 ENCOUNTER — Other Ambulatory Visit: Payer: Self-pay

## 2020-11-22 ENCOUNTER — Encounter (HOSPITAL_COMMUNITY): Payer: Self-pay | Admitting: Neurosurgery

## 2020-11-22 DIAGNOSIS — G8929 Other chronic pain: Secondary | ICD-10-CM | POA: Diagnosis present

## 2020-11-22 DIAGNOSIS — I10 Essential (primary) hypertension: Secondary | ICD-10-CM | POA: Diagnosis not present

## 2020-11-22 DIAGNOSIS — Z79899 Other long term (current) drug therapy: Secondary | ICD-10-CM

## 2020-11-22 DIAGNOSIS — M4316 Spondylolisthesis, lumbar region: Principal | ICD-10-CM | POA: Diagnosis present

## 2020-11-22 DIAGNOSIS — M48062 Spinal stenosis, lumbar region with neurogenic claudication: Secondary | ICD-10-CM | POA: Diagnosis not present

## 2020-11-22 DIAGNOSIS — Z981 Arthrodesis status: Secondary | ICD-10-CM | POA: Diagnosis not present

## 2020-11-22 DIAGNOSIS — Z419 Encounter for procedure for purposes other than remedying health state, unspecified: Secondary | ICD-10-CM

## 2020-11-22 DIAGNOSIS — M48061 Spinal stenosis, lumbar region without neurogenic claudication: Secondary | ICD-10-CM | POA: Diagnosis present

## 2020-11-22 DIAGNOSIS — M545 Low back pain, unspecified: Secondary | ICD-10-CM | POA: Diagnosis present

## 2020-11-22 DIAGNOSIS — M5126 Other intervertebral disc displacement, lumbar region: Secondary | ICD-10-CM | POA: Diagnosis present

## 2020-11-22 DIAGNOSIS — M4326 Fusion of spine, lumbar region: Secondary | ICD-10-CM | POA: Diagnosis not present

## 2020-11-22 LAB — ABO/RH: ABO/RH(D): O POS

## 2020-11-22 SURGERY — POSTERIOR LUMBAR FUSION 1 LEVEL
Anesthesia: General | Site: Spine Lumbar

## 2020-11-22 MED ORDER — ONDANSETRON HCL 4 MG/2ML IJ SOLN
INTRAMUSCULAR | Status: AC
  Filled 2020-11-22: qty 4

## 2020-11-22 MED ORDER — FENTANYL CITRATE (PF) 100 MCG/2ML IJ SOLN
INTRAMUSCULAR | Status: DC | PRN
  Administered 2020-11-22 (×4): 50 ug via INTRAVENOUS

## 2020-11-22 MED ORDER — FENTANYL CITRATE (PF) 100 MCG/2ML IJ SOLN
INTRAMUSCULAR | Status: AC
  Filled 2020-11-22: qty 2

## 2020-11-22 MED ORDER — PHENYLEPHRINE HCL-NACL 20-0.9 MG/250ML-% IV SOLN
INTRAVENOUS | Status: DC | PRN
  Administered 2020-11-22: 20 ug/min via INTRAVENOUS

## 2020-11-22 MED ORDER — PROMETHAZINE HCL 25 MG/ML IJ SOLN
INTRAMUSCULAR | Status: AC
  Filled 2020-11-22: qty 1

## 2020-11-22 MED ORDER — ATORVASTATIN CALCIUM 10 MG PO TABS
20.0000 mg | ORAL_TABLET | Freq: Every day | ORAL | Status: DC
  Administered 2020-11-22: 20 mg via ORAL
  Filled 2020-11-22: qty 2

## 2020-11-22 MED ORDER — ACETAMINOPHEN 650 MG RE SUPP: 650.0000 mg | RECTAL | Status: DC | PRN

## 2020-11-22 MED ORDER — LABETALOL HCL 5 MG/ML IV SOLN
INTRAVENOUS | Status: DC | PRN
  Administered 2020-11-22 (×3): 5 mg via INTRAVENOUS

## 2020-11-22 MED ORDER — LIDOCAINE-EPINEPHRINE 0.5 %-1:200000 IJ SOLN
INTRAMUSCULAR | Status: AC
  Filled 2020-11-22: qty 1

## 2020-11-22 MED ORDER — 0.9 % SODIUM CHLORIDE (POUR BTL) OPTIME
TOPICAL | Status: DC | PRN
  Administered 2020-11-22: 1000 mL

## 2020-11-22 MED ORDER — ONDANSETRON HCL 4 MG/2ML IJ SOLN: 4.0000 mg | Freq: Four times a day (QID) | INTRAMUSCULAR | Status: DC | PRN

## 2020-11-22 MED ORDER — PROPOFOL 10 MG/ML IV BOLUS
INTRAVENOUS | Status: DC | PRN
  Administered 2020-11-22: 140 mg via INTRAVENOUS

## 2020-11-22 MED ORDER — ONDANSETRON HCL 4 MG PO TABS: 4.0000 mg | ORAL_TABLET | Freq: Four times a day (QID) | ORAL | Status: DC | PRN

## 2020-11-22 MED ORDER — FENTANYL CITRATE (PF) 250 MCG/5ML IJ SOLN
INTRAMUSCULAR | Status: AC
  Filled 2020-11-22: qty 5

## 2020-11-22 MED ORDER — MIDAZOLAM HCL 2 MG/2ML IJ SOLN
INTRAMUSCULAR | Status: AC
  Filled 2020-11-22: qty 2

## 2020-11-22 MED ORDER — LIDOCAINE-EPINEPHRINE 0.5 %-1:200000 IJ SOLN
INTRAMUSCULAR | Status: DC | PRN
  Administered 2020-11-22: 10 mL

## 2020-11-22 MED ORDER — CHLORHEXIDINE GLUCONATE CLOTH 2 % EX PADS: 6.0000 | MEDICATED_PAD | Freq: Once | CUTANEOUS | Status: DC

## 2020-11-22 MED ORDER — PROPOFOL 10 MG/ML IV BOLUS
INTRAVENOUS | Status: AC
  Filled 2020-11-22: qty 20

## 2020-11-22 MED ORDER — LABETALOL HCL 5 MG/ML IV SOLN
INTRAVENOUS | Status: AC
  Filled 2020-11-22: qty 4

## 2020-11-22 MED ORDER — MENTHOL 3 MG MT LOZG: 1.0000 | LOZENGE | OROMUCOSAL | Status: DC | PRN

## 2020-11-22 MED ORDER — KETOROLAC TROMETHAMINE 15 MG/ML IJ SOLN
7.5000 mg | Freq: Four times a day (QID) | INTRAMUSCULAR | Status: DC
  Administered 2020-11-22 – 2020-11-23 (×2): 7.5 mg via INTRAVENOUS
  Filled 2020-11-22 (×2): qty 1

## 2020-11-22 MED ORDER — MORPHINE SULFATE (PF) 2 MG/ML IV SOLN
2.0000 mg | INTRAVENOUS | Status: DC | PRN
Start: 2020-11-22 — End: 2020-11-23

## 2020-11-22 MED ORDER — POTASSIUM CHLORIDE IN NACL 20-0.9 MEQ/L-% IV SOLN: INTRAVENOUS | Status: DC

## 2020-11-22 MED ORDER — CEFAZOLIN SODIUM 1 G IJ SOLR
INTRAMUSCULAR | Status: AC
  Filled 2020-11-22: qty 20

## 2020-11-22 MED ORDER — THROMBIN 20000 UNITS EX SOLR: CUTANEOUS | Status: DC | PRN

## 2020-11-22 MED ORDER — DEXAMETHASONE SODIUM PHOSPHATE 10 MG/ML IJ SOLN
INTRAMUSCULAR | Status: AC
  Filled 2020-11-22: qty 2

## 2020-11-22 MED ORDER — CEFAZOLIN SODIUM-DEXTROSE 2-4 GM/100ML-% IV SOLN
2.0000 g | INTRAVENOUS | Status: AC
  Administered 2020-11-22: 2 g via INTRAVENOUS
  Filled 2020-11-22: qty 100

## 2020-11-22 MED ORDER — DOCUSATE SODIUM 100 MG PO CAPS
100.0000 mg | ORAL_CAPSULE | Freq: Two times a day (BID) | ORAL | Status: DC
  Administered 2020-11-22 – 2020-11-23 (×2): 100 mg via ORAL
  Filled 2020-11-22 (×2): qty 1

## 2020-11-22 MED ORDER — CYCLOBENZAPRINE HCL 10 MG PO TABS
10.0000 mg | ORAL_TABLET | Freq: Three times a day (TID) | ORAL | Status: DC | PRN
  Administered 2020-11-22 – 2020-11-23 (×2): 10 mg via ORAL
  Filled 2020-11-22 (×2): qty 1

## 2020-11-22 MED ORDER — SENNOSIDES-DOCUSATE SODIUM 8.6-50 MG PO TABS: 1.0000 | ORAL_TABLET | Freq: Every evening | ORAL | Status: DC | PRN

## 2020-11-22 MED ORDER — FENTANYL CITRATE (PF) 100 MCG/2ML IJ SOLN
25.0000 ug | INTRAMUSCULAR | Status: DC | PRN
  Administered 2020-11-22 (×3): 50 ug via INTRAVENOUS

## 2020-11-22 MED ORDER — FUROSEMIDE 20 MG PO TABS
20.0000 mg | ORAL_TABLET | Freq: Every morning | ORAL | Status: DC
  Administered 2020-11-23: 20 mg via ORAL
  Filled 2020-11-22: qty 1

## 2020-11-22 MED ORDER — POLYETHYLENE GLYCOL 3350 17 G PO PACK: 17.0000 g | PACK | Freq: Every day | ORAL | Status: DC | PRN

## 2020-11-22 MED ORDER — LIDOCAINE 2% (20 MG/ML) 5 ML SYRINGE
INTRAMUSCULAR | Status: DC | PRN
  Administered 2020-11-22: 60 mg via INTRAVENOUS

## 2020-11-22 MED ORDER — LIDOCAINE 2% (20 MG/ML) 5 ML SYRINGE
INTRAMUSCULAR | Status: AC
  Filled 2020-11-22: qty 10

## 2020-11-22 MED ORDER — METOPROLOL TARTRATE 25 MG PO TABS
25.0000 mg | ORAL_TABLET | Freq: Two times a day (BID) | ORAL | Status: DC
  Administered 2020-11-22 – 2020-11-23 (×2): 25 mg via ORAL
  Filled 2020-11-22 (×2): qty 1

## 2020-11-22 MED ORDER — HYDRALAZINE HCL 20 MG/ML IJ SOLN
INTRAMUSCULAR | Status: AC
  Filled 2020-11-22: qty 1

## 2020-11-22 MED ORDER — HYDRALAZINE HCL 25 MG PO TABS
25.0000 mg | ORAL_TABLET | Freq: Three times a day (TID) | ORAL | Status: DC
  Administered 2020-11-22 – 2020-11-23 (×2): 25 mg via ORAL
  Filled 2020-11-22 (×4): qty 1

## 2020-11-22 MED ORDER — SODIUM CHLORIDE 0.9% FLUSH: 3.0000 mL | Freq: Two times a day (BID) | INTRAVENOUS | Status: DC

## 2020-11-22 MED ORDER — FLEET ENEMA 7-19 GM/118ML RE ENEM: 1.0000 | ENEMA | Freq: Once | RECTAL | Status: DC | PRN

## 2020-11-22 MED ORDER — HYDRALAZINE HCL 20 MG/ML IJ SOLN
10.0000 mg | Freq: Once | INTRAMUSCULAR | Status: AC
  Administered 2020-11-22: 10 mg via INTRAVENOUS
  Filled 2020-11-22: qty 0.5

## 2020-11-22 MED ORDER — ACETAMINOPHEN 325 MG PO TABS
650.0000 mg | ORAL_TABLET | ORAL | Status: DC | PRN
  Administered 2020-11-23: 650 mg via ORAL
  Filled 2020-11-22: qty 2

## 2020-11-22 MED ORDER — BUPIVACAINE HCL (PF) 0.5 % IJ SOLN
INTRAMUSCULAR | Status: AC
  Filled 2020-11-22: qty 30

## 2020-11-22 MED ORDER — OXYCODONE HCL 5 MG PO TABS
10.0000 mg | ORAL_TABLET | ORAL | Status: DC | PRN
Start: 2020-11-22 — End: 2020-11-23
  Administered 2020-11-22 – 2020-11-23 (×2): 10 mg via ORAL
  Filled 2020-11-22 (×2): qty 2

## 2020-11-22 MED ORDER — ONDANSETRON HCL 4 MG/2ML IJ SOLN
INTRAMUSCULAR | Status: DC | PRN
  Administered 2020-11-22: 4 mg via INTRAVENOUS

## 2020-11-22 MED ORDER — LACTATED RINGERS IV SOLN: INTRAVENOUS | Status: DC | PRN

## 2020-11-22 MED ORDER — ORAL CARE MOUTH RINSE: 15.0000 mL | Freq: Once | OROMUCOSAL | Status: AC

## 2020-11-22 MED ORDER — CELECOXIB 200 MG PO CAPS
200.0000 mg | ORAL_CAPSULE | Freq: Two times a day (BID) | ORAL | Status: DC
  Administered 2020-11-22 – 2020-11-23 (×2): 200 mg via ORAL
  Filled 2020-11-22 (×2): qty 1

## 2020-11-22 MED ORDER — SODIUM CHLORIDE 0.9 % IV SOLN: 250.0000 mL | INTRAVENOUS | Status: DC

## 2020-11-22 MED ORDER — THROMBIN 20000 UNITS EX SOLR
CUTANEOUS | Status: AC
  Filled 2020-11-22: qty 20000

## 2020-11-22 MED ORDER — SUGAMMADEX SODIUM 200 MG/2ML IV SOLN
INTRAVENOUS | Status: DC | PRN
Start: 2020-11-22 — End: 2020-11-22
  Administered 2020-11-22: 200 mg via INTRAVENOUS

## 2020-11-22 MED ORDER — DEXAMETHASONE SODIUM PHOSPHATE 10 MG/ML IJ SOLN
INTRAMUSCULAR | Status: DC | PRN
  Administered 2020-11-22: 10 mg via INTRAVENOUS

## 2020-11-22 MED ORDER — FENTANYL CITRATE (PF) 100 MCG/2ML IJ SOLN
100.0000 ug | INTRAMUSCULAR | Status: DC | PRN
  Administered 2020-11-22: 50 ug via INTRAVENOUS

## 2020-11-22 MED ORDER — ROCURONIUM BROMIDE 10 MG/ML (PF) SYRINGE
PREFILLED_SYRINGE | INTRAVENOUS | Status: AC
  Filled 2020-11-22: qty 30

## 2020-11-22 MED ORDER — BUPIVACAINE HCL (PF) 0.5 % IJ SOLN
INTRAMUSCULAR | Status: DC | PRN
  Administered 2020-11-22: 30 mL

## 2020-11-22 MED ORDER — ALBUMIN HUMAN 5 % IV SOLN: INTRAVENOUS | Status: DC | PRN

## 2020-11-22 MED ORDER — ACETAMINOPHEN 10 MG/ML IV SOLN: 1000.0000 mg | Freq: Once | INTRAVENOUS | Status: DC | PRN

## 2020-11-22 MED ORDER — LACTATED RINGERS IV SOLN: INTRAVENOUS | Status: DC

## 2020-11-22 MED ORDER — BISACODYL 5 MG PO TBEC: 5.0000 mg | DELAYED_RELEASE_TABLET | Freq: Every day | ORAL | Status: DC | PRN

## 2020-11-22 MED ORDER — OXYCODONE HCL ER 10 MG PO T12A
10.0000 mg | EXTENDED_RELEASE_TABLET | Freq: Two times a day (BID) | ORAL | Status: DC
  Administered 2020-11-22 – 2020-11-23 (×2): 10 mg via ORAL
  Filled 2020-11-22 (×2): qty 1

## 2020-11-22 MED ORDER — OXYCODONE HCL 5 MG PO TABS
5.0000 mg | ORAL_TABLET | ORAL | Status: DC | PRN
  Administered 2020-11-23: 5 mg via ORAL
  Filled 2020-11-22: qty 1

## 2020-11-22 MED ORDER — CHLORHEXIDINE GLUCONATE 0.12 % MT SOLN
15.0000 mL | Freq: Once | OROMUCOSAL | Status: AC
  Administered 2020-11-22: 15 mL via OROMUCOSAL
  Filled 2020-11-22: qty 15

## 2020-11-22 MED ORDER — SODIUM CHLORIDE 0.9% FLUSH: 3.0000 mL | INTRAVENOUS | Status: DC | PRN

## 2020-11-22 MED ORDER — ROCURONIUM BROMIDE 10 MG/ML (PF) SYRINGE
PREFILLED_SYRINGE | INTRAVENOUS | Status: DC | PRN
  Administered 2020-11-22: 10 mg via INTRAVENOUS
  Administered 2020-11-22: 50 mg via INTRAVENOUS
  Administered 2020-11-22: 20 mg via INTRAVENOUS

## 2020-11-22 MED ORDER — PHENOL 1.4 % MT LIQD: 1.0000 | OROMUCOSAL | Status: DC | PRN

## 2020-11-22 MED ORDER — MIDAZOLAM HCL 2 MG/2ML IJ SOLN
INTRAMUSCULAR | Status: DC | PRN
  Administered 2020-11-22: 2 mg via INTRAVENOUS

## 2020-11-22 MED ORDER — PROMETHAZINE HCL 25 MG/ML IJ SOLN
6.2500 mg | INTRAMUSCULAR | Status: DC | PRN
  Administered 2020-11-22: 12.5 mg via INTRAVENOUS

## 2020-11-22 SURGICAL SUPPLY — 61 items
ADH SKN CLS APL DERMABOND .7 (GAUZE/BANDAGES/DRESSINGS) ×1
BAG COUNTER SPONGE SURGICOUNT (BAG) ×3 IMPLANT
BAG SPNG CNTER NS LX DISP (BAG) ×2
BIT DRILL PLIF MAS DISP 5.5MM (DRILL) IMPLANT
BLADE BN FN 3.2XSTRL LF (MISCELLANEOUS) IMPLANT
BLADE BONE MILL FINE (MISCELLANEOUS) ×2
BUR MATCHSTICK NEURO 3.0 LAGG (BURR) ×2 IMPLANT
BUR PRECISION FLUTE 5.0 (BURR) ×2 IMPLANT
CAGE SABLE 10X22 7-13 15D (Cage) ×2 IMPLANT
CANISTER SUCT 3000ML PPV (MISCELLANEOUS) ×2 IMPLANT
CAP RELINE MOD TULIP RMM (Cap) ×4 IMPLANT
CARTRIDGE OIL MAESTRO DRILL (MISCELLANEOUS) ×1 IMPLANT
CNTNR URN SCR LID CUP LEK RST (MISCELLANEOUS) ×1 IMPLANT
CONT SPEC 4OZ STRL OR WHT (MISCELLANEOUS) ×2
COVER BACK TABLE 60X90IN (DRAPES) ×2 IMPLANT
DERMABOND ADVANCED (GAUZE/BANDAGES/DRESSINGS) ×1
DERMABOND ADVANCED .7 DNX12 (GAUZE/BANDAGES/DRESSINGS) ×1 IMPLANT
DIFFUSER DRILL AIR PNEUMATIC (MISCELLANEOUS) ×2 IMPLANT
DRAPE C-ARM 42X72 X-RAY (DRAPES) ×4 IMPLANT
DRAPE C-ARMOR (DRAPES) ×1 IMPLANT
DRAPE LAPAROTOMY 100X72X124 (DRAPES) ×2 IMPLANT
DRAPE SURG 17X23 STRL (DRAPES) ×2 IMPLANT
DRILL PLIF MAS DISP 5.5MM (DRILL) ×2
DRSG OPSITE POSTOP 4X6 (GAUZE/BANDAGES/DRESSINGS) ×1 IMPLANT
DURAPREP 26ML APPLICATOR (WOUND CARE) ×2 IMPLANT
ELECT REM PT RETURN 9FT ADLT (ELECTROSURGICAL) ×2
ELECTRODE REM PT RTRN 9FT ADLT (ELECTROSURGICAL) ×1 IMPLANT
GAUZE 4X4 16PLY ~~LOC~~+RFID DBL (SPONGE) IMPLANT
GAUZE SPONGE 4X4 12PLY STRL (GAUZE/BANDAGES/DRESSINGS) IMPLANT
GLOVE EXAM NITRILE XL STR (GLOVE) IMPLANT
GLOVE SURG LTX SZ6.5 (GLOVE) ×4 IMPLANT
GOWN STRL REUS W/ TWL LRG LVL3 (GOWN DISPOSABLE) ×2 IMPLANT
GOWN STRL REUS W/ TWL XL LVL3 (GOWN DISPOSABLE) IMPLANT
GOWN STRL REUS W/TWL 2XL LVL3 (GOWN DISPOSABLE) IMPLANT
GOWN STRL REUS W/TWL LRG LVL3 (GOWN DISPOSABLE) ×4
GOWN STRL REUS W/TWL XL LVL3 (GOWN DISPOSABLE)
KIT BASIN OR (CUSTOM PROCEDURE TRAY) ×2 IMPLANT
KIT POSITION SURG JACKSON T1 (MISCELLANEOUS) ×2 IMPLANT
KIT TURNOVER KIT B (KITS) ×2 IMPLANT
NDL HYPO 25X1 1.5 SAFETY (NEEDLE) ×1 IMPLANT
NDL SPNL 18GX3.5 QUINCKE PK (NEEDLE) IMPLANT
NEEDLE HYPO 25X1 1.5 SAFETY (NEEDLE) ×2 IMPLANT
NEEDLE SPNL 18GX3.5 QUINCKE PK (NEEDLE) IMPLANT
NS IRRIG 1000ML POUR BTL (IV SOLUTION) ×2 IMPLANT
OIL CARTRIDGE MAESTRO DRILL (MISCELLANEOUS) ×2
PACK LAMINECTOMY NEURO (CUSTOM PROCEDURE TRAY) ×2 IMPLANT
ROD RELINE COCR LORD 5.0X35 (Rod) ×2 IMPLANT
SCREW LOCK RSS 4.5/5.0MM (Screw) ×4 IMPLANT
SCREW SHANK RELINE MOD 5.5X35 (Screw) ×3 IMPLANT
SHANK RELINE MOD 5.5X40 (Screw) ×1 IMPLANT
SPONGE SURGIFOAM ABS GEL 100 (HEMOSTASIS) ×2 IMPLANT
SPONGE T-LAP 4X18 ~~LOC~~+RFID (SPONGE) ×1 IMPLANT
SUT PROLENE 6 0 BV (SUTURE) IMPLANT
SUT VIC AB 0 CT1 18XCR BRD8 (SUTURE) ×1 IMPLANT
SUT VIC AB 0 CT1 8-18 (SUTURE) ×2
SUT VIC AB 2-0 CT1 18 (SUTURE) ×3 IMPLANT
SUT VIC AB 3-0 SH 8-18 (SUTURE) ×4 IMPLANT
TOWEL GREEN STERILE (TOWEL DISPOSABLE) ×2 IMPLANT
TOWEL GREEN STERILE FF (TOWEL DISPOSABLE) ×2 IMPLANT
TRAY FOLEY MTR SLVR 16FR STAT (SET/KITS/TRAYS/PACK) ×2 IMPLANT
WATER STERILE IRR 1000ML POUR (IV SOLUTION) ×2 IMPLANT

## 2020-11-22 NOTE — H&P (Signed)
BP (!) 191/84   Pulse 87   Temp (!) 97.4 F (36.3 C) (Oral)   Resp 17   Ht '5\' 5"'$  (1.651 m)   Wt 82.1 kg   LMP  (LMP Unknown)   SpO2 97%   BMI 30.12 kg/m   Brandy Galloway returns today.  Her uncle states that she has been having pain in the lower back and crying out at night and when she wakes up in the morning for the last 3 to 4 weeks.  Brandy Galloway, while able to speak, is not able to fully express herself and her uncle has been doing this for her ever since I have taken care of her.     PHYSICAL EXAMINATION :  She weighs 189 pounds.  Temperature is 98.2, blood pressure 167/87, pulse 72.  Pain is 9/10.  She follows all commands and her strength seems to be normal in the lower extremities.  Muscle tone, bulk, and coordination were otherwise normal.  She is alert. She is oriented. Again, follows commands.  Speech is non-fluent, but is intelligible.     IMAGING :  Plain x-rays today show a spondylolisthesis at 3-4, which has developed since I operated.  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   Brandy Galloway returns today.  She had the MRI completed this time under a general anesthetic.  She has an L4-5 listhesis which has developed since a simple decompression in 2020, and she is stenotic.  She has a large disc herniation.  She will need to have a PLIF and pedicle screw fixation at L4-5.  I explained this to her uncle, who is her caretaker.

## 2020-11-22 NOTE — Transfer of Care (Signed)
Immediate Anesthesia Transfer of Care Note  Patient: Brandy Galloway  Procedure(s) Performed: Lumbar Four-FivePosterior Lumbar Interbody Fusion (Spine Lumbar)  Patient Location: PACU  Anesthesia Type:General  Level of Consciousness: drowsy and responds to stimulation  Airway & Oxygen Therapy: Patient Spontanous Breathing and Patient connected to face mask oxygen  Post-op Assessment: Report given to RN, Post -op Vital signs reviewed and stable and Patient moving all extremities X 4  Post vital signs: Reviewed and stable  Last Vitals:  Vitals Value Taken Time  BP    Temp    Pulse 90 11/22/20 1912  Resp 16 11/22/20 1912  SpO2 100 % 11/22/20 1912  Vitals shown include unvalidated device data.  Last Pain:  Vitals:   11/22/20 1040  TempSrc: Oral         Complications: No notable events documented.

## 2020-11-22 NOTE — Op Note (Signed)
11/22/2020  8:52 PM  PATIENT:  Brandy Galloway  55 y.o. 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 female whom was taken to the operating room intubated, and placed under a general anesthetic without difficulty. A foley catheter was placed under sterile conditions. She was positioned prone on a Jackson table with all pressure points properly padded.  Her lumbar region and previous incision were prepped and draped in a sterile manner. I infiltrated 10cc's 1/2%lidocaine/1:2000,000 strength epinephrine into the planned incision. I opened the skin with a 10 blade and took the incision down to the thoracolumbar fascia. I exposed the lamina of L3,3,and 5 in a subperiosteal fashion bilaterally. I confirmed my location with an intraoperative xray.  I placed self retaining retractors and started the decompression.  I decompressed the spinal canal via a complete L4 laminectomy and hemilaminectomy of L5. We used the Kerrison punches and the drill to remove the lamina. We decompressed the lateral recesses and the neural foramina along with the spinal canal. This was well beyond the needed exposure for the PLIF. The L4 and L5 roots were well decompressed. PLIF's  were performed at L4/5 in the same fashion. I opened the disc space with a 15 blade then used a variety of instruments to remove the disc and prepare the space for the arthrodesis. I used curettes, rongeurs, punches, shavers for the disc space, and rasps in the discetomy. I measured the disc space and placed Globus expandable titanium cages(globus) into the disc space(s). I filled the cages with autograft morsels, and the disc space too. I placed pedicle screws at L4 and L5, using fluoroscopic guidance. I drilled a pilot hole, then cannulated the pedicle with a drill at each site. I then tapped each pedicle, assessing each site for pedicle violations. No cutouts were appreciated. Screws (nuvasive) were then placed at each site without difficulty. I attached rods and locking caps with the appropriate tools. The locking caps were secured with torque limited screwdrivers. Final films were performed and the final construct appeared to be in good position.  I closed the wound in a layered fashion. I approximated the thoracolumbar fascia, subcutaneous, and subcuticular planes with vicryl sutures. I used dermabond and an occlusive bandage for a sterile dressing.     PLAN OF CARE: Admit to inpatient   PATIENT DISPOSITION:  PACU - hemodynamically stable.   Delay start of Pharmacological VTE agent (>24hrs) due to surgical blood loss or risk of bleeding:  yes

## 2020-11-22 NOTE — Progress Notes (Signed)
Dr. Gloris Manchester aware of elevated BP.  Order for Hydralazine '10mg'$  IV was administered.  Will continue to follow.

## 2020-11-22 NOTE — Anesthesia Procedure Notes (Signed)
Procedure Name: Intubation Date/Time: 11/22/2020 2:38 PM Performed by: Genelle Bal, CRNA Pre-anesthesia Checklist: Patient identified, Emergency Drugs available, Suction available and Patient being monitored Patient Re-evaluated:Patient Re-evaluated prior to induction Oxygen Delivery Method: Circle system utilized Preoxygenation: Pre-oxygenation with 100% oxygen Induction Type: IV induction Ventilation: Mask ventilation without difficulty and Oral airway inserted - appropriate to patient size Laryngoscope Size: Sabra Heck and 2 Grade View: Grade I Tube type: Oral Tube size: 7.0 mm Number of attempts: 1 Airway Equipment and Method: Stylet and Oral airway Placement Confirmation: ETT inserted through vocal cords under direct vision, positive ETCO2 and breath sounds checked- equal and bilateral Secured at: 22 cm Tube secured with: Tape Dental Injury: Teeth and Oropharynx as per pre-operative assessment  Comments: Placed by Orland Mustard SRNA

## 2020-11-22 NOTE — Progress Notes (Signed)
Notified Dr. Gloris Manchester of pt's BP 191/81,pulse 87 after 10 mg Hydralazine IV administered at 1144. No new orders at this time.

## 2020-11-23 LAB — GLUCOSE, CAPILLARY: Glucose-Capillary: 252 mg/dL — ABNORMAL HIGH (ref 70–99)

## 2020-11-23 NOTE — Discharge Instructions (Signed)
Wound Care Leave incision open to air. You may shower. Do not scrub directly on incision.  Do not put any creams, lotions, or ointments on incision. Activity Walk each and every day, increasing distance each day. No lifting greater than 5 lbs.  Avoid bending, arching, and twisting. No driving for 2 weeks; may ride as a passenger locally.  Diet Resume your normal diet.  Return to Work Will be discussed at you follow up appointment. Call Your Doctor If Any of These Occur Redness, drainage, or swelling at the wound.  Temperature greater than 101 degrees. Severe pain not relieved by pain medication. Incision starts to come apart. Follow Up Appt Call today for appointment in 3-4 weeks [phone]) or for problems.  If you have any hardware placed in your spine, you will need an x-ray before your appointment.

## 2020-11-23 NOTE — Evaluation (Signed)
Physical Therapy Evaluation Patient Details Name: Brandy Galloway MRN: [id number] DOB: [birth year] Today's Date: 11/23/2020   History of Present Illness  Patient is a 55 year old female who presents s/p L4-L5 PLIF on 11/22/2020. PMH significant for developmental delay and cognitive impairments, seizure, HTN, laminectomy/decompression L3-4 in 2020.   Clinical Impression  Pt admitted with above diagnosis. At the time of PT eval, pt was able to demonstrate transfers and ambulation with gross min guard assist to min assist for balance support, safety, and walker management. Pt/uncle were educated on precautions, positioning recommendations, appropriate activity progression, and car transfer. Pt currently with functional limitations due to the deficits listed below (see PT Problem List). Pt will benefit from skilled PT to increase their independence and safety with mobility to allow discharge to the venue listed below.      Follow Up Recommendations No PT follow up;Supervision for mobility/OOB    Equipment Recommendations  Rolling walker with 5" wheels    Recommendations for Other Services       Precautions / Restrictions Precautions Precautions: Back;Fall Precaution Booklet Issued: Yes (comment) Precaution Comments: Reviewed handout with pt and uncle/caregiver and pt was cued for precautions during functional mobility. Restrictions Weight Bearing Restrictions: No      Mobility  Bed Mobility Overal bed mobility: Needs Assistance Bed Mobility: Sit to Supine     Supine to sit: Min guard;HOB elevated Sit to supine: Mod assist   General bed mobility comments: HOB flat and rails lowered to simulate home environment. Assist for proper log roll technique, and to elevate LE's up into bed.    Transfers Overall transfer level: Needs assistance Equipment used: Rolling walker (2 wheeled) Transfers: Sit to/from Stand Sit to Stand: Min guard Stand pivot transfers: Min guard       General  transfer comment: VC's for hand placement on seated surface for safety. No assist required.  Ambulation/Gait Ambulation/Gait assistance: Min assist Gait Distance (Feet): 175 Feet Assistive device: Rolling walker (2 wheeled) Gait Pattern/deviations: Step-through pattern;Decreased stride length;Trunk flexed Gait velocity: Decreased Gait velocity interpretation: 1.31 - 2.62 ft/sec, indicative of limited community ambulator General Gait Details: Grossly flexed trunk with difficulty correcting posture. Occasional min assist required for walker management and tactile cues.  Stairs            Wheelchair Mobility    Modified Rankin (Stroke Patients Only)       Balance Overall balance assessment: Needs assistance Sitting-balance support: Feet supported;No upper extremity supported Sitting balance-Leahy Scale: Fair       Standing balance-Leahy Scale: Poor Standing balance comment: reliant on at least 1 UE support                             Pertinent Vitals/Pain Pain Assessment: Faces Faces Pain Scale: Hurts a little bit Pain Location: side Pain Descriptors / Indicators: Grimacing Pain Intervention(s): Limited activity within patient's tolerance;Monitored during session    Home Living Family/patient expects to be discharged to:: Private residence Living Arrangements: Other (Comment) (uncle who is legal guardian) Available Help at Discharge: Family;Available 24 hours/day Type of Home: House Home Access: Level entry     Home Layout: One level Home Equipment: Cane - single point;Walker - 2 wheels      Prior Function Level of Independence: Needs assistance   Gait / Transfers Assistance Needed: walks with  cane  ADL's / Homemaking Assistance Needed: needs assistance with all task  Hand Dominance        Extremity/Trunk Assessment   Upper Extremity Assessment Upper Extremity Assessment: Defer to OT evaluation    Lower Extremity  Assessment Lower Extremity Assessment: Generalized weakness    Cervical / Trunk Assessment Cervical / Trunk Assessment: Kyphotic;Other exceptions Cervical / Trunk Exceptions: forward head posture with rounded shoulders  Communication   Communication: No difficulties  Cognition Arousal/Alertness: Awake/alert Behavior During Therapy: WFL for tasks assessed/performed Overall Cognitive Status: History of cognitive impairments - at baseline                                 General Comments: Able to follow commands and requires increased cues at times      General Comments      Exercises     Assessment/Plan    PT Assessment Patient needs continued PT services  PT Problem List Decreased strength;Decreased activity tolerance;Decreased balance;Decreased mobility;Decreased knowledge of use of DME;Decreased safety awareness;Decreased knowledge of precautions;Decreased cognition;Pain       PT Treatment Interventions DME instruction;Gait training;Functional mobility training;Therapeutic activities;Therapeutic exercise;Neuromuscular re-education;Patient/family education;Balance training    PT Goals (Current goals can be found in the Care Plan section)  Acute Rehab PT Goals Patient Stated Goal: Decrease pain PT Goal Formulation: With patient/family Time For Goal Achievement: 11/30/20 Potential to Achieve Goals: Good    Frequency Min 5X/week   Barriers to discharge        Co-evaluation               AM-PAC PT "6 Clicks" Mobility  Outcome Measure Help needed turning from your back to your side while in a flat bed without using bedrails?: A Little Help needed moving from lying on your back to sitting on the side of a flat bed without using bedrails?: A Little Help needed moving to and from a bed to a chair (including a wheelchair)?: A Little Help needed standing up from a chair using your arms (e.g., wheelchair or bedside chair)?: A Little Help needed to walk in  hospital room?: A Little Help needed climbing 3-5 steps with a railing? : A Little 6 Click Score: 18    End of Session Equipment Utilized During Treatment: Gait belt Activity Tolerance: Patient tolerated treatment well Patient left: in bed;with call bell/phone within reach Nurse Communication: Mobility status PT Visit Diagnosis: Unsteadiness on feet (R26.81);Pain Pain - part of body:  (back, L flank)    Time: [id number] PT Time Calculation (min) (ACUTE ONLY): 24 min   Charges:   PT Evaluation $PT Eval Low Complexity: 1 Low PT Treatments $Gait Training: 8-22 mins        Rolinda Roan, PT, DPT Acute Rehabilitation Services Pager: [phone] Office: [phone]   Thelma Comp 11/23/2020, 9:28 AM

## 2020-11-23 NOTE — Progress Notes (Signed)
Pt. discharged home accompanied by uncle. Prescriptions and discharge instructions given with verbalization of understanding. Incision site on back with no s/s of infection - no swelling, redness, bleeding, and/or drainage noted. Opportunity given to ask questions but no question asked. Pt. transported out of this unit in wheelchair by the volunteer

## 2020-11-23 NOTE — Evaluation (Signed)
Occupational Therapy Evaluation Patient Details Name: Brandy Galloway MRN: [id number] DOB: November 03, 1965 Today's Date: 11/23/2020    History of Present Illness Patient is a 44 yera old woman with cognitive impaires s/p Lumbar Four-Five Posterior Lumbar Interbody Fusion   Clinical Impression   Ms. Brandy Galloway is a 55 year old woman who presents s/p spinal surgery with pain and with baseline cognitive deficits in the presence of her full time caregiver and Uncle. Therapist provided instruction to patient and uncle in regards to spinal precautions and compensatory strategies for ADLs. Patient able to safely transfer out of bed without breaking back precautions and ambulate in room with cane. Patient needing intermittent assistance with LB dressing typically at home and Uncle provided with reiteration of education to assist with below knee tasks. ADL kit not introduced due to patient's cognitive deficits. Patient attempting to help and therapist and uncle cueing patient to stay upright. Therapist recommended shower chair for safety if needed at home. Patient verbalized understanding of all education. No further OT needs.    Follow Up Recommendations  No OT follow up    Equipment Recommendations  Tub/shower seat    Recommendations for Other Services       Precautions / Restrictions Precautions Precautions: Back Precaution Booklet Issued: Yes (comment) (handout) Restrictions Weight Bearing Restrictions: No      Mobility Bed Mobility Overal bed mobility: Needs Assistance Bed Mobility: Supine to Sit     Supine to sit: Min guard;HOB elevated     General bed mobility comments: Patient able to transfer to side of bed using upper extremities to scoot -- able to maintain neutral back positioning without log roll technique    Transfers Overall transfer level: Needs assistance Equipment used: Straight cane Transfers: Sit to/from [employer] Transfers Sit to Stand: Min guard Stand  pivot transfers: Min guard       General transfer comment: min guard to ambulate in room with cane    Balance Overall balance assessment: Mild deficits observed, not formally tested                                         ADL either performed or assessed with clinical judgement   ADL Overall ADL's : Needs assistance/impaired Eating/Feeding: Set up;Supervision/ safety   Grooming: Set up;Supervision/safety Grooming Details (indicate cue type and reason): stood at sink to wash face Upper Body Bathing: Set up;Moderate assistance;Sitting   Lower Body Bathing: Moderate assistance;Supervison/ safety;Set up   Upper Body Dressing : Moderate assistance;Supervision/safety;Set up   Lower Body Dressing: Moderate assistance Lower Body Dressing Details (indicate cue type and reason): needs assistance with clothing below the knees to maintain spinal precautions, uncle able to assist and typically performs at baseline at times. Toilet Transfer: Designer, fashion/clothing and Hygiene: Sit to/from stand;Moderate assistance Toileting - Clothing Manipulation Details (indicate cue type and reason): incontinent, wears depends     Functional mobility during ADLs: Min guard       Vision   Vision Assessment?: No apparent visual deficits     Perception     Praxis      Pertinent Vitals/Pain Pain Assessment: Faces Faces Pain Scale: Hurts a little bit Pain Location: side Pain Descriptors / Indicators: Grimacing Pain Intervention(s): Limited activity within patient's tolerance;Monitored during session     Hand Dominance     Extremity/Trunk Assessment Upper Extremity Assessment Upper Extremity  Assessment: Overall WFL for tasks assessed   Lower Extremity Assessment Lower Extremity Assessment: Defer to PT evaluation   Cervical / Trunk Assessment Cervical / Trunk Assessment: Kyphotic   Communication Communication Communication: No difficulties    Cognition Arousal/Alertness: Awake/alert Behavior During Therapy: WFL for tasks assessed/performed Overall Cognitive Status: History of cognitive impairments - at baseline                                 General Comments: Able to follow commands.   General Comments       Exercises     Shoulder Instructions      Home Living Family/patient expects to be discharged to:: Private residence Living Arrangements: Other (Comment) (uncle) Available Help at Discharge: Family;Available 24 hours/day Type of Home: House       Home Layout: One level     Bathroom Shower/Tub: Teacher, early years/pre: Standard     Home Equipment: Cane - single point;Walker - 2 wheels          Prior Functioning/Environment Level of Independence: Needs assistance  Gait / Transfers Assistance Needed: walks with  cane ADL's / Homemaking Assistance Needed: needs assistance with all task            OT Problem List: Decreased activity tolerance;Decreased knowledge of use of DME or AE;Decreased knowledge of precautions;Pain      OT Treatment/Interventions:      OT Goals(Current goals can be found in the care plan section) Acute Rehab OT Goals OT Goal Formulation: All assessment and education complete, DC therapy  OT Frequency:     Barriers to D/C:            Co-evaluation              AM-PAC OT "6 Clicks" Daily Activity     Outcome Measure Help from another person eating meals?: A Little Help from another person taking care of personal grooming?: A Little Help from another person toileting, which includes using toliet, bedpan, or urinal?: A Lot Help from another person bathing (including washing, rinsing, drying)?: A Lot Help from another person to put on and taking off regular upper body clothing?: A Lot Help from another person to put on and taking off regular lower body clothing?: A Lot 6 Click Score: 14   End of Session Equipment Utilized During  Treatment: Other (comment) (cane) Nurse Communication:  (No OT needs)  Activity Tolerance: Patient tolerated treatment well Patient left: in chair;with call bell/phone within reach;with family/visitor present  OT Visit Diagnosis: Pain                Time: 4462-8638 OT Time Calculation (min): 21 min Charges:  OT General Charges $OT Visit: 1 Visit OT Evaluation $OT Eval Low Complexity: 1 Low  Henleigh Robello, OTR/L [place]  Office [phone] Pager: [place] 11/23/2020, 8:47 AM

## 2020-11-24 NOTE — Anesthesia Postprocedure Evaluation (Signed)
Anesthesia Post Note  Patient: Brandy Galloway  Procedure(s) Performed: Lumbar Four-FivePosterior Lumbar Interbody Fusion (Spine Lumbar)     Patient location during evaluation: PACU Anesthesia Type: General Level of consciousness: awake and alert Pain management: pain level controlled Vital Signs Assessment: post-procedure vital signs reviewed and stable Respiratory status: spontaneous breathing, nonlabored ventilation, respiratory function stable and patient connected to nasal cannula oxygen Cardiovascular status: blood pressure returned to baseline and stable Postop Assessment: no apparent nausea or vomiting Anesthetic complications: no   No notable events documented.  Last Vitals:  Vitals:   11/23/20 0744 11/23/20 1110  BP: (!) 175/75 (!) 176/79  Pulse: 77 72  Resp: 18 19  Temp: 36.7 C 36.7 C  SpO2: 100% 100%    Last Pain:  Vitals:   11/23/20 0401  TempSrc: Oral  PainSc:                  [place] S

## 2020-12-01 NOTE — Discharge Summary (Signed)
Physician Discharge Summary  Patient ID: Brandy Galloway MRN: [id number] DOB/AGE: February 05, 1966 55 y.o.  Admit date: 11/22/2020 Discharge date: 12/01/2020  Admission Diagnoses:Spondylolisthesis, Lumbar region L4/5 Lumbar stenosis L4/5 Herniated disc L4/5  Discharge Diagnoses: Spondylolisthesis, Lumbar region L4/5 Lumbar stenosis L4/5 Herniated disc L4/5 Active Problems:   Lumbar back pain   Spondylolisthesis of lumbar region   Discharged Condition: good  Hospital Course: Brandy Galloway who is mentally challenged presented with severe pain in the lower extremities after undergoing a lumbar decompression previously at L4/5. She was taken to the operating room for an uncomplicated lumbar decompression and arthrodesis. Post op she ambulated, voided, and tolerated a regular diet. Her wound was clean, dry, and without signs of infection.   Treatments: surgery: Lumbar Four-FivePosterior Lumbar Interbody Fusion,  Globus expandable cages filled with local autograft morsels Complete L4 laminectomy beyond the needed exposure for the PLIF Non segmental pedicle screw fixation, nuvasive L4, L5  Discharge Exam: Blood pressure (!) 176/79, pulse 72, temperature 98 F (36.7 C), resp. rate 19, height '5\' 5"'$  (1.651 m), weight 82.1 kg, SpO2 100 %. General appearance: alert, cooperative, appears stated age, and no distress  Disposition: Discharge disposition: 01-Home or Self Care      Spondylolisthesis, Lumbar region  Allergies as of 11/23/2020   No Known Allergies      Medication List     STOP taking these medications    lidocaine 4 % cream Commonly known as: LMX       TAKE these medications    acetaminophen 500 MG tablet Commonly known as: TYLENOL Take 500 mg by mouth every 6 (six) hours as needed for moderate pain.   atorvastatin 20 MG tablet Commonly known as: LIPITOR Take 20 mg by mouth at bedtime.   diclofenac Sodium 1 % Gel Commonly known as: VOLTAREN Apply 1 application  topically 4 (four) times daily as needed (leg pain).   diphenhydramine-acetaminophen 25-500 MG Tabs tablet Commonly known as: TYLENOL PM Take 1 tablet by mouth at bedtime as needed (sleep/pain).   furosemide 20 MG tablet Commonly known as: LASIX Take 20 mg by mouth every morning.   hydrALAZINE 25 MG tablet Commonly known as: APRESOLINE Take 25 mg by mouth 3 (three) times daily.   HYDROcodone-acetaminophen 5-325 MG tablet Commonly known as: Norco Take 1 tablet by mouth every 6 (six) hours as needed for moderate pain.   hydrocortisone 1 % ointment Apply 1 application topically 2 (two) times daily as needed for itching.   metoprolol tartrate 25 MG tablet Commonly known as: LOPRESSOR Take 25 mg by mouth 2 (two) times daily.   polyethylene glycol 17 g packet Commonly known as: MIRALAX / GLYCOLAX Take 17 g by mouth daily as needed (constipation).   triamcinolone cream 0.5 % Commonly known as: KENALOG Apply 1 application topically 2 (two) times daily as needed (itching).        Follow-up Information     Ashok Pall, MD Follow up in 3 week(s).   Specialty: Neurosurgery Why: please call to make an appointment Contact information: [address] [place] [address] [phone]                 Signed: Ashok Pall 12/01/2020, 4:46 PM

## 2020-12-12 DIAGNOSIS — M4316 Spondylolisthesis, lumbar region: Secondary | ICD-10-CM | POA: Diagnosis not present

## 2020-12-12 DIAGNOSIS — Z6827 Body mass index (BMI) 27.0-27.9, adult: Secondary | ICD-10-CM | POA: Diagnosis not present

## 2020-12-15 DIAGNOSIS — R531 Weakness: Secondary | ICD-10-CM | POA: Diagnosis not present

## 2021-01-01 DIAGNOSIS — R41841 Cognitive communication deficit: Secondary | ICD-10-CM | POA: Diagnosis not present

## 2021-01-01 DIAGNOSIS — K0251 Dental caries on pit and fissure surface limited to enamel: Secondary | ICD-10-CM | POA: Diagnosis not present

## 2021-01-01 DIAGNOSIS — I10 Essential (primary) hypertension: Secondary | ICD-10-CM | POA: Diagnosis not present

## 2021-03-30 DIAGNOSIS — K029 Dental caries, unspecified: Secondary | ICD-10-CM | POA: Diagnosis not present

## 2021-04-24 DIAGNOSIS — I10 Essential (primary) hypertension: Secondary | ICD-10-CM | POA: Diagnosis not present

## 2021-05-11 DIAGNOSIS — I10 Essential (primary) hypertension: Secondary | ICD-10-CM | POA: Diagnosis not present

## 2021-06-10 ENCOUNTER — Encounter (HOSPITAL_COMMUNITY): Payer: Self-pay | Admitting: Emergency Medicine

## 2021-06-10 ENCOUNTER — Other Ambulatory Visit: Payer: Self-pay

## 2021-06-10 ENCOUNTER — Emergency Department (HOSPITAL_COMMUNITY)
Admission: EM | Admit: 2021-06-10 | Discharge: 2021-06-10 | Disposition: A | Discharge status: Home / Self Care | Payer: Medicaid Other | Attending: Emergency Medicine | Admitting: Emergency Medicine

## 2021-06-10 DIAGNOSIS — K0889 Other specified disorders of teeth and supporting structures: Secondary | ICD-10-CM | POA: Diagnosis not present

## 2021-06-10 DIAGNOSIS — I10 Essential (primary) hypertension: Secondary | ICD-10-CM | POA: Insufficient documentation

## 2021-06-10 DIAGNOSIS — K1379 Other lesions of oral mucosa: Secondary | ICD-10-CM | POA: Insufficient documentation

## 2021-06-10 DIAGNOSIS — I161 Hypertensive emergency: Secondary | ICD-10-CM | POA: Diagnosis not present

## 2021-06-10 DIAGNOSIS — Z79899 Other long term (current) drug therapy: Secondary | ICD-10-CM | POA: Insufficient documentation

## 2021-06-10 DIAGNOSIS — R6884 Jaw pain: Secondary | ICD-10-CM | POA: Diagnosis not present

## 2021-06-10 MED ORDER — NAPROXEN 375 MG PO TABS: 365.0000 mg | ORAL_TABLET | Freq: Two times a day (BID) | ORAL | 0 refills | Status: AC

## 2021-06-10 NOTE — ED Triage Notes (Signed)
C/o pain to L lower mouth x 1 month.  Uncle states she had all of her teeth extracted prior to pain starting.

## 2021-06-10 NOTE — ED Provider Notes (Signed)
[facility] EMERGENCY DEPARTMENT Provider Note   CSN: [id number] Arrival date & time: 06/10/21  1309     History  Chief Complaint  Patient presents with   Dental Pain    Brandy Galloway is a 56 y.o. female.  HPI  56 year old female with a history of LSIL, anxiety, hypertension, seizures, MR, who presents to the emergency department today with left lower mouth/dental pain.  Her uncle is at bedside and assists with the history due to patient's history of MR.  He states the patient had her teeth pulled several months ago.  Since then she has had intermittent pain to the left lower mouth.  He states that she was evaluated by the dentist who did not see any signs of infection and recommended that the patient be referred to neurology.  He contacted the patient's PCP who supposedly told him that he needed to get the referral from the dentist so he is requesting this referral today.  The patient does not have any other complaints at this time.  Home Medications Prior to Admission medications   Medication Sig Start Date End Date Taking? Authorizing Provider  naproxen (NAPROSYN) 375 MG tablet Take 1 tablet (375 mg total) by mouth 2 (two) times daily for 7 days. 06/10/21 06/17/21 Yes Emari Hreha S, PA-C  acetaminophen (TYLENOL) 500 MG tablet Take 500 mg by mouth every 6 (six) hours as needed for moderate pain.     [provider]  atorvastatin (LIPITOR) 20 MG tablet Take 20 mg by mouth at bedtime. 11/30/19   [provider]  diclofenac Sodium (VOLTAREN) 1 % GEL Apply 1 application topically 4 (four) times daily as needed (leg pain). 12/05/19   [provider]  diphenhydramine-acetaminophen (TYLENOL PM) 25-500 MG TABS tablet Take 1 tablet by mouth at bedtime as needed (sleep/pain).    [provider]  furosemide (LASIX) 20 MG tablet Take 20 mg by mouth every morning. 12/18/19   [provider]  hydrALAZINE (APRESOLINE) 25 MG tablet Take  25 mg by mouth 3 (three) times daily. 09/22/20   [provider]  HYDROcodone-acetaminophen (NORCO) 5-325 MG tablet Take 1 tablet by mouth every 6 (six) hours as needed for moderate pain. 05/27/20   Felipa Furnace, DPM  hydrocortisone 1 % ointment Apply 1 application topically 2 (two) times daily as needed for itching. 11/15/19   [provider]  metoprolol tartrate (LOPRESSOR) 25 MG tablet Take 25 mg by mouth 2 (two) times daily. 11/29/19   [provider]  polyethylene glycol (MIRALAX / GLYCOLAX) packet Take 17 g by mouth daily as needed (constipation).    [provider]  triamcinolone cream (KENALOG) 0.5 % Apply 1 application topically 2 (two) times daily as needed (itching). 10/26/20   [provider]      Allergies    Patient has no known allergies.    Review of Systems   Review of Systems See HPI for pertinent positives or negatives.   Physical Exam Updated Vital Signs BP (!) 193/88 (BP Location: Right Arm)    Pulse 64    Temp 98.3 F (36.8 C) (Oral)    Resp 14    LMP  (LMP Unknown)    SpO2 99%  Physical Exam Vitals and nursing note reviewed.  Constitutional:      General: She is not in acute distress.    Appearance: She is well-developed.  HENT:     Head: Normocephalic and atraumatic.     Mouth/Throat:  Comments: Edentulous. Ttp to the left jawline and to the left tmj Eyes:     Conjunctiva/sclera: Conjunctivae normal.  Cardiovascular:     Rate and Rhythm: Normal rate.  Pulmonary:     Effort: Pulmonary effort is normal.  Musculoskeletal:        General: Normal range of motion.     Cervical back: Neck supple.  Skin:    General: Skin is warm and dry.  Neurological:     Mental Status: She is alert.    ED Results / Procedures / Treatments   Labs (all labs ordered are listed, but only abnormal results are displayed) Labs Reviewed - No data to display  EKG None  Radiology No results found.  Procedures Procedures     Medications Ordered in ED Medications - No data to display  ED Course/ Medical Decision Making/ A&P                           Medical Decision Making  56 year old female presents with her uncle requesting a referral to neurology.  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  He states he contacted PCP to request a referral but they stated that the dentist needed to make the referral.  Patient is seeking assistance to establish care with neurology.  The patient has no facial swelling, no tenderness along the gumline or evidence of abscess.  She has already been evaluated by her dentist who is nonconcerning for an infectious process causing her symptoms.  They suspect her symptoms are related to nerve pain.  She said no fevers reported.  She is afebrile here.  She does have some elevated blood pressure which I suspect is likely chronic given review of her past notes have similar blood pressures.  I have low suspicion for hypertensive emergency at this time as she does not have any other systemic symptoms to suggest this.  She will need to follow-up with PCP in regards to further treatment of this.  Ambulatory referral to neurology was also given at the request of the patient's caregiver.  Final Clinical Impression(s) / ED Diagnoses Final diagnoses:  Mouth pain  Uncontrolled hypertension    Rx / DC Orders ED Discharge Orders          Ordered    Ambulatory referral to Neurology       Comments: An appointment is requested in approximately: 4 weeks   06/10/21 1507    naproxen (NAPROSYN) 375 MG tablet  2 times daily        06/10/21 1509              Zuley Lutter S, PA-C 06/10/21 1510    Godfrey Pick, MD 06/10/21 2253

## 2021-06-10 NOTE — Discharge Instructions (Addendum)
The patients blood pressure is not controlled. She needs to keep a log of her blood pressure and bring it to her primary care appointment because she may need to have her blood pressure medications increased.   Please follow up in the next 5-7 days .  Please return to the emergency department for any new or worsening symptoms.

## 2021-06-12 ENCOUNTER — Telehealth: Payer: Self-pay | Admitting: Obstetrics and Gynecology

## 2021-06-12 NOTE — Telephone Encounter (Signed)
[name], patient's guardian left a voicemail trying to get Brandy Galloway's depo injection.  Three attempts to call [name] back.   Patient will need to be scheduled for an annual.  Her last pap was abnormal and colpo was declined.  Will attempt to reach caregiver again.

## 2021-06-15 DIAGNOSIS — I10 Essential (primary) hypertension: Secondary | ICD-10-CM | POA: Diagnosis not present

## 2021-06-15 DIAGNOSIS — R41841 Cognitive communication deficit: Secondary | ICD-10-CM | POA: Diagnosis not present

## 2021-06-15 DIAGNOSIS — E782 Mixed hyperlipidemia: Secondary | ICD-10-CM | POA: Diagnosis not present

## 2021-06-20 HISTORY — PX: MOUTH SURGERY: SHX715

## 2021-06-27 NOTE — H&P (Signed)
?  Brandy Galloway   ?[birth year] ? ?JOI:NOMVEH Disability, HTN, Seizures ? ?Allergies: NKDA ? ?Meds: Triamterene, Norvasc, HCTZ, Xanax, hydrocodone-acetaminophe ? ?Pt c/o pain left jaw. Points to premolar area. ?Taking BP meds. BP today 185/82 ? ?Exam: Bilateral mandibular lingual tori. Edentulous maxilla and mandible. No exposed bone,  fluctuance, edema, pharynx clear.  ? ?A: Lingual tori ? ?P: Remove lingual tori. GA. Day Surgery. ? ? ?Gae Bon, DM ?

## 2021-06-27 NOTE — Progress Notes (Signed)
Attempted to make preop call, VM full, unable to leave message.  ?

## 2021-06-28 ENCOUNTER — Encounter (HOSPITAL_COMMUNITY): Payer: Self-pay | Admitting: Oral Surgery

## 2021-06-28 ENCOUNTER — Other Ambulatory Visit: Payer: Self-pay

## 2021-06-28 NOTE — Anesthesia Preprocedure Evaluation (Addendum)
Anesthesia Evaluation  ?Patient identified by MRN, date of birth, ID band ?Patient awake ? ? ? ?Reviewed: ?Allergy & Precautions, NPO status , Patient's Chart, lab work & pertinent test results ? ?Airway ?Mallampati: II ? ?TM Distance: >3 FB ?Neck ROM: Full ? ? ? Dental ?no notable dental hx. ?(+) Edentulous Upper, Edentulous Lower ?  ?Pulmonary ?neg pulmonary ROS,  ?  ?Pulmonary exam normal ?breath sounds clear to auscultation ? ? ? ? ? ? Cardiovascular ?hypertension, Pt. on medications ?Normal cardiovascular exam ?Rhythm:Regular Rate:Normal ? ? ?  ?Neuro/Psych ?Seizures - (developmental delay), Well Controlled,  Anxiety   ? GI/Hepatic ?negative GI ROS, Neg liver ROS,   ?Endo/Other  ?negative endocrine ROS ? Renal/GU ?  ? ?  ?Musculoskeletal ?negative musculoskeletal ROS ?(+)  ? Abdominal ?  ?Peds ? Hematology ?  ?Anesthesia Other Findings ? ? Reproductive/Obstetrics ? ?  ? ? ? ? ? ? ? ? ? ? ? ? ? ?  ?  ? ? ? ? ? ? ? ?Anesthesia Physical ?Anesthesia Plan ? ?ASA: 2 ? ?Anesthesia Plan: General  ? ?Post-op Pain Management:   ? ?Induction: Intravenous ? ?PONV Risk Score and Plan: 3 and Treatment may vary due to age or medical condition, Midazolam, Ondansetron and Dexamethasone ? ?Airway Management Planned: Nasal ETT and Oral ETT ? ?Additional Equipment: None ? ?Intra-op Plan:  ? ?Post-operative Plan: Extubation in OR ? ?Informed Consent: I have reviewed the patients History and Physical, chart, labs and discussed the procedure including the risks, benefits and alternatives for the proposed anesthesia with the patient or authorized representative who has indicated his/her understanding and acceptance.  ? ? ? ?Dental advisory given ? ?Plan Discussed with:  ? ?Anesthesia Plan Comments:   ? ? ? ? ? ?Anesthesia Quick Evaluation ? ?

## 2021-06-28 NOTE — Progress Notes (Signed)
I spoke with Brandy Galloway, Brandy Galloway legal guardian. Mr Brandy Galloway reports that Brandy Galloway has not had chest pain or shortness of breath. Mr. Brandy Galloway denies having any s/s of Covid in his household.  Patient denies any known exposure to Covid.  ? ?PCP is Dr. Criss Galloway. ? ?Mr. Brandy Galloway stated that Brandy Galloway follows instructions well, she is alert oriented to her home, speech is most parroting. ? ?I instructed Mr. Brandy Galloway to have Brandy Galloway to shower with antibacteria soap.  DO not shave. No nail polish, artificial or acrylic nails. Wear clean clothes, brush your teeth. ?Glasses, contact lens,dentures or partials may not be worn in the OR. If you need to wear them, please bring a case for glasses, do not wear contacts or bring a case, the hospital does not have contact cases, dentures or partials will have to be removed , make sure they are clean, we will provide a denture cup to put them in. You will need some one to drive you home and a responsible person over the age of 97 to stay with you for the first 24 hours after surgery.  ?

## 2021-06-29 ENCOUNTER — Encounter (HOSPITAL_COMMUNITY): Payer: Self-pay | Admitting: Oral Surgery

## 2021-06-29 ENCOUNTER — Ambulatory Visit (HOSPITAL_COMMUNITY): Payer: Medicaid Other | Admitting: Certified Registered Nurse Anesthetist

## 2021-06-29 ENCOUNTER — Encounter (HOSPITAL_COMMUNITY)
Admission: RE | Disposition: A | Discharge status: Home / Self Care | Payer: Self-pay | Source: Home / Self Care | Attending: Oral Surgery

## 2021-06-29 ENCOUNTER — Ambulatory Visit (HOSPITAL_COMMUNITY)
Admission: RE | Admit: 2021-06-29 | Discharge: 2021-06-29 | Disposition: A | Discharge status: Home / Self Care | Payer: Medicaid Other | Attending: Oral Surgery | Admitting: Oral Surgery

## 2021-06-29 ENCOUNTER — Ambulatory Visit (HOSPITAL_BASED_OUTPATIENT_CLINIC_OR_DEPARTMENT_OTHER): Payer: Medicaid Other | Admitting: Certified Registered Nurse Anesthetist

## 2021-06-29 ENCOUNTER — Other Ambulatory Visit: Payer: Self-pay

## 2021-06-29 DIAGNOSIS — R569 Unspecified convulsions: Secondary | ICD-10-CM | POA: Diagnosis not present

## 2021-06-29 DIAGNOSIS — R6250 Unspecified lack of expected normal physiological development in childhood: Secondary | ICD-10-CM | POA: Insufficient documentation

## 2021-06-29 DIAGNOSIS — I10 Essential (primary) hypertension: Secondary | ICD-10-CM | POA: Diagnosis not present

## 2021-06-29 DIAGNOSIS — M270 Developmental disorders of jaws: Secondary | ICD-10-CM | POA: Insufficient documentation

## 2021-06-29 DIAGNOSIS — M899 Disorder of bone, unspecified: Secondary | ICD-10-CM | POA: Insufficient documentation

## 2021-06-29 DIAGNOSIS — Z79899 Other long term (current) drug therapy: Secondary | ICD-10-CM | POA: Insufficient documentation

## 2021-06-29 DIAGNOSIS — F419 Anxiety disorder, unspecified: Secondary | ICD-10-CM | POA: Diagnosis not present

## 2021-06-29 DIAGNOSIS — M278 Other specified diseases of jaws: Secondary | ICD-10-CM | POA: Diagnosis not present

## 2021-06-29 HISTORY — DX: Microcephaly: Q02

## 2021-06-29 HISTORY — DX: Personal history of other mental and behavioral disorders: Z86.59

## 2021-06-29 HISTORY — PX: TOOTH EXTRACTION: SHX859

## 2021-06-29 LAB — BASIC METABOLIC PANEL
Anion gap: 8 (ref 5–15)
BUN: 16 mg/dL (ref 6–20)
CO2: 26 mmol/L (ref 22–32)
Calcium: 9.5 mg/dL (ref 8.9–10.3)
Chloride: 105 mmol/L (ref 98–111)
Creatinine, Ser: 0.86 mg/dL (ref 0.44–1.00)
GFR, Estimated: >60 mL/min (ref >60)
Glucose, Bld: 101 mg/dL — ABNORMAL HIGH (ref 70–99)
Potassium: 3.8 mmol/L (ref 3.5–5.1)
Sodium: 139 mmol/L (ref 135–145)

## 2021-06-29 LAB — CBC
HCT: 35.7 % — ABNORMAL LOW (ref 36.0–46.0)
Hemoglobin: 11.8 g/dL — ABNORMAL LOW (ref 12.0–15.0)
MCH: 30.4 pg (ref 26.0–34.0)
MCHC: 33.1 g/dL (ref 30.0–36.0)
MCV: 92 fL (ref 80.0–100.0)
Platelets: 249 10*3/uL (ref 150–400)
RBC: 3.88 MIL/uL (ref 3.87–5.11)
RDW: 12.9 % (ref 11.5–15.5)
WBC: 5 10*3/uL (ref 4.0–10.5)
nRBC: 0 % (ref 0.0–0.2)

## 2021-06-29 SURGERY — DENTAL RESTORATION/EXTRACTIONS
Anesthesia: General | Site: Mouth

## 2021-06-29 MED ORDER — CHLORHEXIDINE GLUCONATE 0.12 % MT SOLN
OROMUCOSAL | Status: AC
  Administered 2021-06-29: 15 mL via OROMUCOSAL
  Filled 2021-06-29: qty 15

## 2021-06-29 MED ORDER — CHLORHEXIDINE GLUCONATE 0.12 % MT SOLN: 15.0000 mL | Freq: Once | OROMUCOSAL | Status: AC

## 2021-06-29 MED ORDER — LIDOCAINE 2% (20 MG/ML) 5 ML SYRINGE
INTRAMUSCULAR | Status: AC
  Filled 2021-06-29: qty 5

## 2021-06-29 MED ORDER — LIDOCAINE 2% (20 MG/ML) 5 ML SYRINGE
INTRAMUSCULAR | Status: DC | PRN
  Administered 2021-06-29: 100 mg via INTRAVENOUS

## 2021-06-29 MED ORDER — ORAL CARE MOUTH RINSE: 15.0000 mL | Freq: Once | OROMUCOSAL | Status: AC

## 2021-06-29 MED ORDER — OXYCODONE-ACETAMINOPHEN 5-325 MG PO TABS: 1.0000 | ORAL_TABLET | ORAL | 0 refills | Status: DC | PRN

## 2021-06-29 MED ORDER — OXYMETAZOLINE HCL 0.05 % NA SOLN
NASAL | Status: AC
  Filled 2021-06-29: qty 30

## 2021-06-29 MED ORDER — FENTANYL CITRATE (PF) 100 MCG/2ML IJ SOLN
25.0000 ug | INTRAMUSCULAR | Status: DC | PRN
  Administered 2021-06-29 (×2): 50 ug via INTRAVENOUS

## 2021-06-29 MED ORDER — ROCURONIUM BROMIDE 10 MG/ML (PF) SYRINGE
PREFILLED_SYRINGE | INTRAVENOUS | Status: DC | PRN
  Administered 2021-06-29: 70 mg via INTRAVENOUS

## 2021-06-29 MED ORDER — FENTANYL CITRATE (PF) 250 MCG/5ML IJ SOLN
INTRAMUSCULAR | Status: AC
  Filled 2021-06-29: qty 5

## 2021-06-29 MED ORDER — HYDRALAZINE HCL 20 MG/ML IJ SOLN
10.0000 mg | Freq: Once | INTRAMUSCULAR | Status: AC
  Administered 2021-06-29: 10 mg via INTRAVENOUS

## 2021-06-29 MED ORDER — ACETAMINOPHEN 10 MG/ML IV SOLN: 1000.0000 mg | Freq: Once | INTRAVENOUS | Status: DC | PRN

## 2021-06-29 MED ORDER — CEFAZOLIN SODIUM-DEXTROSE 2-4 GM/100ML-% IV SOLN
2.0000 g | INTRAVENOUS | Status: AC
  Administered 2021-06-29: 2 g via INTRAVENOUS

## 2021-06-29 MED ORDER — LIDOCAINE-EPINEPHRINE 2 %-1:100000 IJ SOLN
INTRAMUSCULAR | Status: DC | PRN
  Administered 2021-06-29: 10 mL

## 2021-06-29 MED ORDER — MIDAZOLAM HCL 2 MG/2ML IJ SOLN
INTRAMUSCULAR | Status: AC
  Filled 2021-06-29: qty 2

## 2021-06-29 MED ORDER — HYDRALAZINE HCL 20 MG/ML IJ SOLN
INTRAMUSCULAR | Status: AC
  Filled 2021-06-29: qty 1

## 2021-06-29 MED ORDER — 0.9 % SODIUM CHLORIDE (POUR BTL) OPTIME
TOPICAL | Status: DC | PRN
Start: 2021-06-29 — End: 2021-06-29
  Administered 2021-06-29: 200 mL

## 2021-06-29 MED ORDER — DEXAMETHASONE SODIUM PHOSPHATE 10 MG/ML IJ SOLN
INTRAMUSCULAR | Status: DC | PRN
Start: 2021-06-29 — End: 2021-06-29
  Administered 2021-06-29: 10 mg via INTRAVENOUS

## 2021-06-29 MED ORDER — CEFAZOLIN SODIUM-DEXTROSE 2-4 GM/100ML-% IV SOLN
INTRAVENOUS | Status: AC
  Filled 2021-06-29: qty 100

## 2021-06-29 MED ORDER — LACTATED RINGERS IV SOLN: INTRAVENOUS | Status: DC

## 2021-06-29 MED ORDER — FENTANYL CITRATE (PF) 100 MCG/2ML IJ SOLN
INTRAMUSCULAR | Status: DC | PRN
  Administered 2021-06-29 (×3): 50 ug via INTRAVENOUS

## 2021-06-29 MED ORDER — OXYMETAZOLINE HCL 0.05 % NA SOLN
NASAL | Status: DC | PRN
  Administered 2021-06-29: 1 via TOPICAL

## 2021-06-29 MED ORDER — ONDANSETRON HCL 4 MG/2ML IJ SOLN
INTRAMUSCULAR | Status: DC | PRN
Start: 2021-06-29 — End: 2021-06-29
  Administered 2021-06-29: 4 mg via INTRAVENOUS

## 2021-06-29 MED ORDER — FENTANYL CITRATE (PF) 100 MCG/2ML IJ SOLN
INTRAMUSCULAR | Status: AC
  Filled 2021-06-29: qty 2

## 2021-06-29 MED ORDER — ONDANSETRON HCL 4 MG/2ML IJ SOLN
INTRAMUSCULAR | Status: AC
  Filled 2021-06-29: qty 2

## 2021-06-29 MED ORDER — LIDOCAINE-EPINEPHRINE 2 %-1:100000 IJ SOLN
INTRAMUSCULAR | Status: AC
  Filled 2021-06-29: qty 1

## 2021-06-29 MED ORDER — OXYCODONE HCL 5 MG PO TABS: 5.0000 mg | ORAL_TABLET | Freq: Once | ORAL | Status: DC | PRN

## 2021-06-29 MED ORDER — OXYMETAZOLINE HCL 0.05 % NA SOLN
NASAL | Status: DC | PRN
  Administered 2021-06-29: 2 via NASAL

## 2021-06-29 MED ORDER — PROPOFOL 10 MG/ML IV BOLUS
INTRAVENOUS | Status: DC | PRN
Start: 2021-06-29 — End: 2021-06-29
  Administered 2021-06-29: 150 mg via INTRAVENOUS

## 2021-06-29 MED ORDER — SUGAMMADEX SODIUM 200 MG/2ML IV SOLN
INTRAVENOUS | Status: DC | PRN
  Administered 2021-06-29: 400 mg via INTRAVENOUS

## 2021-06-29 MED ORDER — OXYCODONE HCL 5 MG/5ML PO SOLN: 5.0000 mg | Freq: Once | ORAL | Status: DC | PRN

## 2021-06-29 MED ORDER — DEXAMETHASONE SODIUM PHOSPHATE 10 MG/ML IJ SOLN
INTRAMUSCULAR | Status: AC
  Filled 2021-06-29: qty 1

## 2021-06-29 MED ORDER — MIDAZOLAM HCL 2 MG/2ML IJ SOLN
INTRAMUSCULAR | Status: DC | PRN
  Administered 2021-06-29 (×2): 1 mg via INTRAVENOUS

## 2021-06-29 SURGICAL SUPPLY — 37 items
BAG COUNTER SPONGE SURGICOUNT (BAG) IMPLANT
BAG SPNG CNTER NS LX DISP (BAG)
BLADE SURG 15 STRL LF DISP TIS (BLADE) ×2 IMPLANT
BLADE SURG 15 STRL SS (BLADE)
BUR CROSS CUT FISSURE 1.6 (BURR) ×2 IMPLANT
BUR EGG ELITE 4.0 (BURR) ×3 IMPLANT
CANISTER SUCT 3000ML PPV (MISCELLANEOUS) ×3 IMPLANT
COVER SURGICAL LIGHT HANDLE (MISCELLANEOUS) ×3 IMPLANT
DECANTER SPIKE VIAL GLASS SM (MISCELLANEOUS) ×2 IMPLANT
DRAPE U-SHAPE 76X120 STRL (DRAPES) ×3 IMPLANT
GAUZE PACKING FOLDED 2  STR (GAUZE/BANDAGES/DRESSINGS) ×2
GAUZE PACKING FOLDED 2 STR (GAUZE/BANDAGES/DRESSINGS) ×2 IMPLANT
GLOVE SURG ENC MOIS LTX SZ6.5 (GLOVE) IMPLANT
GLOVE SURG ENC MOIS LTX SZ7 (GLOVE) IMPLANT
GLOVE SURG ENC MOIS LTX SZ8 (GLOVE) ×3 IMPLANT
GLOVE SURG UNDER POLY LF SZ6.5 (GLOVE) IMPLANT
GLOVE SURG UNDER POLY LF SZ7 (GLOVE) IMPLANT
GOWN STRL REUS W/ TWL LRG LVL3 (GOWN DISPOSABLE) ×2 IMPLANT
GOWN STRL REUS W/ TWL XL LVL3 (GOWN DISPOSABLE) ×2 IMPLANT
GOWN STRL REUS W/TWL LRG LVL3 (GOWN DISPOSABLE) ×2
GOWN STRL REUS W/TWL XL LVL3 (GOWN DISPOSABLE) ×2
IV NS 1000ML (IV SOLUTION) ×2
IV NS 1000ML BAXH (IV SOLUTION) ×2 IMPLANT
KIT BASIN OR (CUSTOM PROCEDURE TRAY) ×3 IMPLANT
KIT TURNOVER KIT B (KITS) ×3 IMPLANT
NDL HYPO 25GX1X1/2 BEV (NEEDLE) ×4 IMPLANT
NEEDLE HYPO 25GX1X1/2 BEV (NEEDLE) ×2 IMPLANT
NS IRRIG 1000ML POUR BTL (IV SOLUTION) ×3 IMPLANT
PAD ARMBOARD 7.5X6 YLW CONV (MISCELLANEOUS) ×3 IMPLANT
SLEEVE IRRIGATION ELITE 7 (MISCELLANEOUS) ×3 IMPLANT
SPONGE SURGIFOAM ABS GEL 12-7 (HEMOSTASIS) IMPLANT
SUT CHROMIC 3 0 PS 2 (SUTURE) ×4 IMPLANT
SYR BULB IRRIG 60ML STRL (SYRINGE) ×3 IMPLANT
SYR CONTROL 10ML LL (SYRINGE) ×3 IMPLANT
TRAY ENT MC OR (CUSTOM PROCEDURE TRAY) ×3 IMPLANT
TUBING IRRIGATION (MISCELLANEOUS) ×3 IMPLANT
YANKAUER SUCT BULB TIP NO VENT (SUCTIONS) ×3 IMPLANT

## 2021-06-29 NOTE — Anesthesia Procedure Notes (Signed)
Procedure Name: Intubation ?Date/Time: 06/29/2021 10:43 AM ?Performed by: Genelle Bal, CRNA ?Pre-anesthesia Checklist: Patient identified, Emergency Drugs available, Suction available and Patient being monitored ?Patient Re-evaluated:Patient Re-evaluated prior to induction ?Oxygen Delivery Method: Circle system utilized ?Preoxygenation: Pre-oxygenation with 100% oxygen ?Induction Type: IV induction ?Ventilation: Mask ventilation without difficulty ?Laryngoscope Size: Sabra Heck and 2 ?Grade View: Grade I ?Nasal Tubes: Nasal prep performed, Nasal Rae, Magill forceps- large, utilized and Left ?Tube size: 7.0 mm ?Number of attempts: 1 ?Placement Confirmation: ETT inserted through vocal cords under direct vision, positive ETCO2 and breath sounds checked- equal and bilateral ?Secured at: 27 cm ?Tube secured with: Tape ?Dental Injury: Teeth and Oropharynx as per pre-operative assessment  ? ? ? ? ?

## 2021-06-29 NOTE — Anesthesia Postprocedure Evaluation (Signed)
Anesthesia Post Note ? ?Patient: Brandy Galloway ? ?Procedure(s) Performed: REMOVAL BILATERAL MANDIBULAR LINGUAL TORI (Mouth) ? ?  ? ?Patient location during evaluation: PACU ?Anesthesia Type: General ?Level of consciousness: awake and alert ?Pain management: pain level controlled ?Vital Signs Assessment: post-procedure vital signs reviewed and stable ?Respiratory status: spontaneous breathing, nonlabored ventilation, respiratory function stable and patient connected to nasal cannula oxygen ?Cardiovascular status: blood pressure returned to baseline and stable ?Postop Assessment: no apparent nausea or vomiting ?Anesthetic complications: no ? ? ?No notable events documented. ? ?Last Vitals:  ?Vitals:  ? 06/29/21 1215 06/29/21 1228  ?BP: (!) 182/91 (!) 167/79  ?Pulse: 73 79  ?Resp: (!) 22 17  ?Temp:  36.6 ?C  ?SpO2: 98% 98%  ?  ?Last Pain:  ?Vitals:  ? 06/29/21 1127  ?TempSrc:   ?PainSc: Asleep  ? ? ?  ?  ?  ?  ?  ?  ? ?[place]et [place] ? ? ? ? ?

## 2021-06-29 NOTE — Op Note (Signed)
06/29/2021 ? ?11:10 AM ? ?PATIENT:  Brandy Galloway  55 y.o. female ? ?PRE-OPERATIVE DIAGNOSIS:  BILATERAL MANDIBULAR LINGUAL TORI ? ?POST-OPERATIVE DIAGNOSIS:  SAME + RIGHT MANDIBULAR BUCCAL EXOSTOSIS ? ?PROCEDURE:  Procedure(s): ?REMOVAL BILATERAL MANDIBULAR LINGUAL TORI, REMOVAL RIGHT MANDIBULAR BUCCAL EXOSTOSIS ? ?SURGEON:  Surgeon(s): ?Diona Browner, DMD ? ?ANESTHESIA:   local and general ? ?EBL:  minimal ? ?DRAINS: none  ? ?SPECIMEN:  No Specimen ? ?COUNTS:  YES ? ?PLAN OF CARE: Discharge to home after PACU ? ?PATIENT DISPOSITION:  PACU - hemodynamically stable. ?  ?PROCEDURE DETAILS: ?Dictation # [id number] ? ?Brandy Galloway, DMD ?06/29/2021 ?11:10 AM ? ? ? ? ? ? ? ? ? ? ? ? ? ? ? ?  ?

## 2021-06-29 NOTE — Transfer of Care (Signed)
Immediate Anesthesia Transfer of Care Note ? ?Patient: TEESHA OHM ? ?Procedure(s) Performed: REMOVAL BILATERAL MANDIBULAR LINGUAL TORI (Mouth) ? ?Patient Location: PACU ? ?Anesthesia Type:General ? ?Level of Consciousness: drowsy and patient cooperative ? ?Airway & Oxygen Therapy: Patient Spontanous Breathing and Patient connected to face mask oxygen ? ?Post-op Assessment: Report given to RN and Post -op Vital signs reviewed and stable ? ?Post vital signs: Reviewed and stable ? ?Last Vitals:  ?Vitals Value Taken Time  ?BP 191/89 06/29/21 1127  ?Temp 36.6 ?C 06/29/21 1127  ?Pulse 91 06/29/21 1128  ?Resp 16 06/29/21 1128  ?SpO2 99 % 06/29/21 1128  ?Vitals shown include unvalidated device data. ? ?Last Pain:  ?Vitals:  ? 06/29/21 0850  ?TempSrc:   ?PainSc: 0-No pain  ?   ? ?Patients Stated Pain Goal: 0 (06/29/21 0850) ? ?Complications: No notable events documented. ?

## 2021-06-29 NOTE — Op Note (Signed)
NAME: Brandy Galloway, Brandy Galloway. ?MEDICAL RECORD NO: [id number] ?ACCOUNT NO: [id number] ?DATE OF BIRTH: 08/27/1965 ?FACILITY: [facility] ?LOCATION: [facility] ?PHYSICIAN: Gae Bon, DDS ? ?Operative Report  ? ?DATE OF PROCEDURE: 06/29/2021 ? ?PREOPERATIVE DIAGNOSIS:  Bilateral mandibular lingual tori. ? 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 ? ?PROCEDURE:  Removal bilateral mandibular lingual tori; removal, right mandibular buccal exostosis. ? ?SURGEON:  Gae Bon, DDS. ? ?ANESTHESIA:  General and nasal intubation.  Dr. Broadus John. ? ?DESCRIPTION OF PROCEDURE:  The patient was taken to the operating room and placed on the table in supine position.  General anesthesia was administered and nasal endotracheal tube was placed and secured.  The eyes were protected and the patient was  ?draped for surgery.  Timeout was performed.  The posterior pharynx was suctioned and a throat pack was placed.  2% lidocaine 1:100,000 epinephrine was infiltrated in an inferior alveolar block on the right and left sides and in buccal infiltration and  ?lingual infiltration in the anterior mandible.  A bite block was placed on the right side of the mouth.  A sweetheart retractor was used to retract the tongue.  A #15 blade was used to make an incision on the alveolar crest beginning in the area of  ?approximately near the first molar region of the edentulous mandible and was carried forward to the midline.  The periosteum was reflected.  The lingual torus was identified.  The Seldin retractor was used to retract the lingual tissues.  Then, the egg  ?bur under irrigation and Stryker handpiece was used to reduce the lingual torus by contouring.  Then, the bone file was used to smooth the area further and then the area was irrigated and closed with 3-0 chromic.  Then, the bite block and sweetheart  ?retractor were repositioned to the other side of the mouth.  A 15 blade was used to make an  incision beginning in the second molar region of the edentulous right mandible, carried forward to the midline.  The periosteum was reflected. It was noted that  ?there was also a buccal undercut in the area between the premolar and the canine and the tissue was reflected buccally to expose this, the Stryker handpiece was used to reduce this exostosis and then the dissection was carried lingually to expose the  ?lingual torus.  The Seldin retractor was again used to reflect the lingual tissues and protect them.  Then, the handpiece with egg bur under irrigation was used to reduce the lingual torus and then the area was further smoothed with a bone file then  ?irrigated and closed with 3-0 chromic.  The oral cavity was then irrigated and suctioned.  Throat pack was removed.  The patient was left under care of anesthesia for extubation and transport to recovery room with plans for discharge home through day  ?surgery. ? ?ESTIMATED BLOOD LOSS:  Minimum. ? ?COMPLICATIONS:  None. ? ?SPECIMENS:  None. ? ? ?PUS ?D: 06/29/2021 11:14:01 am T: 06/29/2021 2:57:00 pm  ?JOB: [id number]/ [id number]  ?

## 2021-06-29 NOTE — H&P (Signed)
H&P documentation  -History and Physical Reviewed  -Patient has been re-examined  -No change in the plan of care  Brandy Galloway  

## 2021-06-30 ENCOUNTER — Encounter (HOSPITAL_COMMUNITY): Payer: Self-pay | Admitting: Oral Surgery

## 2021-07-02 ENCOUNTER — Ambulatory Visit: Payer: Medicaid Other | Admitting: Psychiatry

## 2021-07-19 ENCOUNTER — Ambulatory Visit: Payer: Medicaid Other | Admitting: Psychiatry

## 2021-08-01 ENCOUNTER — Ambulatory Visit: Payer: Medicaid Other | Admitting: Psychiatry

## 2021-08-01 ENCOUNTER — Encounter: Payer: Self-pay | Admitting: Psychiatry

## 2021-08-01 VITALS — BP 183/96 | HR 68 | Wt 157.4 lb

## 2021-08-01 DIAGNOSIS — R519 Headache, unspecified: Secondary | ICD-10-CM | POA: Diagnosis not present

## 2021-08-01 MED ORDER — OXCARBAZEPINE 150 MG PO TABS: 150.0000 mg | ORAL_TABLET | Freq: Two times a day (BID) | ORAL | 3 refills | Status: DC

## 2021-08-01 MED ORDER — BACLOFEN 10 MG PO TABS
ORAL_TABLET | ORAL | 3 refills | Status: DC
Start: 2021-08-01 — End: 2021-08-17

## 2021-08-01 NOTE — Progress Notes (Signed)
? ?[facility] ? ?PATIENT: Brandy Galloway ?DOB: 07-Oct-1965 ? ?REFERRING CLINICIAN: Couture, Cortni S, PA-C ?HISTORY FROM: self, uncle/legal guardian [name] ?REASON FOR VISIT: facial pain ? ? ?HISTORICAL ? ?CHIEF COMPLAINT:  ?Chief Complaint  ?Patient presents with  ? Mouth Pain  ?  Rm 1 ED Ref, legal guardian/Uncle- [name] Johnson  ? ? ?HISTORY OF PRESENT ILLNESS:  ?56 year old female who presents for evaluation of left-sided facial pain. She is developmentally delayed and minimally verbal. History is obtained by her uncle who is her legal guardian. They are poor historians. States pain began before her recent dental surgery 2-3 weeks ago, but otherwise cannot give a timeline for when pain began. Patient cannot describe the pain, though uncle states he thinks the pain is sharp. He thinks the pain is constant because she complains of it very frequently, but it is unclear if she actually has pain free moments or not. Eating makes the pain worse. ? ?She takes Tylenol extra strength as needed for pain. She was prescribed oxycodone from her dentist as well. These helped a little but did not get rid of the pain. ? ?The patient has a history of dental problems and is edentulous. She had a recent dental surgery 2-3 weeks ago, but did not notice improvement in her pain. Her dentist recommended neurology referral for potential trigeminal neuralgia. ? ? ?FACIAL PAIN FEATURES  ?Side: left ?Distribution: left V3 ?Character: sharp ?Duration: unclear, uncle thinks it is constant because she complains about it frequently ?Pain-free between episodes: unclear ?Triggers: eating ?Sensory abnormalities: no ?Tried Tegretol/Trileptal: no ?Prior procedures/outcome: none ?History of MS, Lyme's disease, facial rash: no ?History of dental/oral surgery, facial/plastic surgery: has had all her teeth pulled  ? ? ?OTHER MEDICAL CONDITIONS: HTN, developmental delay, seizure disorder ? ? ?REVIEW OF SYSTEMS: Full 14 system  review of systems performed and negative with exception of: mouth pain ? ?ALLERGIES: ?No Known Allergies ? ?HOME MEDICATIONS: ?Outpatient Medications Prior to Visit  ?Medication Sig Dispense Refill  ? acetaminophen (TYLENOL) 500 MG tablet Take 500 mg by mouth 2 (two) times daily as needed for headache (pain).    ? hydrALAZINE (APRESOLINE) 25 MG tablet Take 25 mg by mouth 3 (three) times daily.    ? hydrochlorothiazide (MICROZIDE) 12.5 MG capsule Take 12.5 mg by mouth daily.    ? metoprolol tartrate (LOPRESSOR) 25 MG tablet Take 25 mg by mouth 2 (two) times daily.    ? oxyCODONE-acetaminophen (PERCOCET) 5-325 MG tablet Take 1 tablet by mouth every 4 (four) hours as needed. 20 tablet 0  ? polyethylene glycol (MIRALAX / GLYCOLAX) packet Take 17 g by mouth daily as needed (constipation).    ? oxyCODONE-acetaminophen (PERCOCET) 5-325 MG tablet Take 1 tablet by mouth every 4 (four) hours as needed. 20 tablet 0  ? ?No facility-administered medications prior to visit.  ? ? ?PAST MEDICAL HISTORY: ?Past Medical History:  ?Diagnosis Date  ? Anxiety   ? History of claustrophobia   ? Hypertension   ? LSIL (low grade squamous intraepithelial lesion) on Pap smear 07/12/2011  ? Mental retardation   ? Microcephaly ([place])   ? Seizures ([place])   ? last one more than 20 yrs ago  ? Seizures ([place])   ? years ago; more than 20 years ago  ? ? ?PAST SURGICAL HISTORY: ?Past Surgical History:  ?Procedure Laterality Date  ? CERVICAL CONE BIOPSY  2009  ? CERVICAL CONIZATION W/BX  08/20/2011  ? Procedure: CONIZATION CERVIX WITH BIOPSY;  Surgeon:  Emily Filbert, MD;  Location: [facility];  Service: Gynecology;  Laterality: N/A;  With Endocervical [place]  ? COLPOSCOPY  06/2011  ? COLPOSCOPY N/A 05/28/2016  ? Procedure: COLPOSCOPY;  Surgeon: Mora Bellman, MD;  Location: [facility];  Service: Gynecology;  Laterality: N/A;  ? DILATION AND CURETTAGE OF UTERUS N/A 05/28/2016  ? Procedure: DILATATION AND CURETTAGE;  Surgeon: Mora Bellman, MD;  Location: [facility];   Service: Gynecology;  Laterality: N/A;  ? LEEP  2009  ? LUMBAR LAMINECTOMY/DECOMPRESSION MICRODISCECTOMY N/A 03/31/2019  ? Procedure: LAMINECTOMY LUMBAR THREE- LUMBAR FOUR, LUMBAR FOUR- LUMBAR FIVE;  Surgeon: Ashok Pall, MD;  Location: [facility];  Service: Neurosurgery;  Laterality: N/A;  LAMINECTOMY LUMBAR THREE- LUMBAR FOUR, LUMBAR FOUR- LUMBAR FIVE  ? RADIOLOGY WITH ANESTHESIA N/A 10/17/2020  ? Procedure: MRI WITH ANESTHESIA LUMBAR SPINE WITH AND WITHOUT CONTRAST;  Surgeon: Radiologist, Medication, MD;  Location: [facility];  Service: Radiology;  Laterality: N/A;  ? TOOTH EXTRACTION N/A 06/29/2021  ? Procedure: REMOVAL BILATERAL MANDIBULAR LINGUAL TORI;  Surgeon: Diona Browner, DMD;  Location: [facility];  Service: Oral Surgery;  Laterality: N/A;  ? ? ?FAMILY HISTORY: ?Family History  ?Problem Relation Age of Onset  ? Diabetes Father   ? Diabetes Mother   ? ? ?SOCIAL HISTORY: ?Social History  ? ?Socioeconomic History  ? Marital status: Single  ?  Spouse name: Not on file  ? Number of children: 0  ? Years of education: Not on file  ? Highest education level: Not on file  ?Occupational History  ? Not on file  ?Tobacco Use  ? Smoking status: Never  ? Smokeless tobacco: Never  ?Vaping Use  ? Vaping Use: Never used  ?Substance and Sexual Activity  ? Alcohol use: Never  ? Drug use: Never  ? Sexual activity: Never  ?  Birth control/protection: None  ?Other Topics Concern  ? Not on file  ?Social History Narrative  ? 08/01/21 Lives with her uncle who is her legal guardian, [name]  ? ?Social Determinants of Health  ? ?Financial Resource Strain: Not on file  ?Food Insecurity: Not on file  ?Transportation Needs: Not on file  ?Physical Activity: Not on file  ?Stress: Not on file  ?Social Connections: Not on file  ?Intimate Partner Violence: Not on file  ? ? ? ?PHYSICAL EXAM ? ?GENERAL EXAM/CONSTITUTIONAL: ?Vitals:  ?Vitals:  ? 08/01/21 1110  ?BP: (!) 183/96  ?Pulse: 68  ?Weight: 157 lb 6.4 oz (71.4 kg)  ? ?Body mass index is 24.29  kg/m?. ?Wt Readings from Last 3 Encounters:  ?08/01/21 157 lb 6.4 oz (71.4 kg)  ?06/29/21 180 lb (81.6 kg)  ?11/22/20 181 lb (82.1 kg)  ? ?Patient is in no distress; well developed, nourished and groomed; neck is supple ? ?CARDIOVASCULAR: ?Examination of peripheral vascular system by observation and palpation is normal ? ?NEUROLOGIC: ?MENTAL STATUS:  ?awake, alert, minimally verbal (occasionally will answer yes or no), minimal command following ? ?CRANIAL NERVE:  ?2nd, 3rd, 4th, 6th - pupils equal and reactive to light, visual fields full to confrontation, extraocular muscles intact ?5th - facial sensation symmetric ?7th - facial strength symmetric ?8th - hearing grossly intact ?9th - palate elevates symmetrically, uvula midline ?11th - shoulder shrug symmetric ?12th - tongue protrusion midline ? ?MOTOR:  ?Moves all extremities equally ? ?SENSORY:  ?Intact to light touch all 4 extremities ? ?COORDINATION:  ?Unable to assess finger to nose ? ?REFLEXES:  ?deep tendon reflexes present and symmetric ? ?No TMJ click. Left  side of her jaw is tender to palpation. ? ? ? ?DIAGNOSTIC DATA (LABS, IMAGING, TESTING) ?- I reviewed patient records, labs, notes, testing and imaging myself where available. ? ?Lab Results  ?Component Value Date  ? WBC 5.0 06/29/2021  ? HGB 11.8 (L) 06/29/2021  ? HCT 35.7 (L) 06/29/2021  ? MCV 92.0 06/29/2021  ? PLT 249 06/29/2021  ? ?   ?Component Value Date/Time  ? NA 139 06/29/2021 0830  ? K 3.8 06/29/2021 0830  ? CL 105 06/29/2021 0830  ? CO2 26 06/29/2021 0830  ? GLUCOSE 101 (H) 06/29/2021 0830  ? BUN 16 06/29/2021 0830  ? CREATININE 0.86 06/29/2021 0830  ? CALCIUM 9.5 06/29/2021 0830  ? GFRNONAA >60 06/29/2021 0830  ? GFRAA >60 03/29/2019 1042  ? ?No results found for: CHOL, HDL, LDLCALC, LDLDIRECT, TRIG, CHOLHDL ?No results found for: HGBA1C ?No results found for: VITAMINB12 ?Lab Results  ?Component Value Date  ? TSH 1.307 08/09/2011  ? ? ? ?ASSESSMENT AND PLAN ? ?56 y.o. year old female with a  history of HTN, developmental delay, seizure disorder who presents for evaluation of left sided facial pain. It is very difficult to obtain a history or good description of her pain due to her developmental d

## 2021-08-01 NOTE — Patient Instructions (Addendum)
Start oxcarbazepine 150 mg twice a day for nerve pain ?Start baclofen 5-10 mg up to three times a day as needed for breakthrough pain ?

## 2021-08-03 ENCOUNTER — Telehealth: Payer: Self-pay | Admitting: Psychiatry

## 2021-08-03 NOTE — Telephone Encounter (Signed)
Pt's uncle has called to report that pt is still having her jaw pain.  Pt's uncle states even with the medication pt is still having the jaw pain, please call. ?

## 2021-08-06 NOTE — Telephone Encounter (Signed)
Pt's uncle has called to report that pt is complaining of her gums hurting, please call. ?

## 2021-08-06 NOTE — Telephone Encounter (Signed)
Pt guardian, Charlotte Crumb, called to let us know pt medication has been working and pt is not in much pain any longer.  ?

## 2021-08-06 NOTE — Telephone Encounter (Signed)
Called legal guardian Charlotte Crumb and reviewed medications. She is taking oxcarbazepine twice daily but he has not given her Baclofen. I advised when she complains of jaw pain he needs to give her Baclofen 1/2 - 1 tab. She can have it up to three times a day. He will try this and call us back if any further questions. He verbalized understanding, appreciation.' ?

## 2021-08-06 NOTE — Telephone Encounter (Signed)
Called Brandy Galloway and advised he call surgeon that pulled her teeth.  He stated the surgeon told him her pain is nerve related, and sent her to [place]. I recommended he try baclofen as I recommended earlier. He stated he gave her a 1/2 tablet, but she is still complaining.  I advised him again that he can give 1/2 - 1 tab up to three x a day. She must try that for a few days to see if her pain is relived. If not he should call us back . He  verbalized understanding, appreciation. ? ?

## 2021-08-06 NOTE — Telephone Encounter (Signed)
Pt's legal guardian, Brandy Galloway said medication is not working. Pt is still holding her jaw. Would like a call from nurse. ?

## 2021-08-07 NOTE — Telephone Encounter (Signed)
Brandy Galloway called again expressing Brandy Galloway jaw is still in pain. I read the nurse note to him to give medication to her for a few days. He gave medication to Brandy Galloway 3x yesterday.  ?He wanted to let office know that she is still experiencing severe pain.  ?

## 2021-08-07 NOTE — Telephone Encounter (Signed)
Noted, will check on patient tomorrow.  ?

## 2021-08-08 NOTE — Telephone Encounter (Signed)
Increase the Trileptal to 300 mg twice daily.

## 2021-08-08 NOTE — Telephone Encounter (Signed)
[employer] and informed him Dr April Manson has advised he increase trileptal to 2 tablets twice a day, total 300 mg twice daily. He will let us know in several days how she is feeling, verbalized understanding, appreciation. ? ?

## 2021-08-08 NOTE — Telephone Encounter (Signed)
[name] calling to inform office pt jaw pain is still going on.  ?

## 2021-08-13 ENCOUNTER — Telehealth: Payer: Self-pay | Admitting: Neurology

## 2021-08-13 MED ORDER — OXCARBAZEPINE 300 MG PO TABS: 150.0000 mg | ORAL_TABLET | Freq: Three times a day (TID) | ORAL | 5 refills | Status: DC

## 2021-08-13 NOTE — Telephone Encounter (Signed)
Pt's uncle has called back to report pt is still in pain with no relief, he is asking for a call back. ?

## 2021-08-13 NOTE — Telephone Encounter (Signed)
Her guardian called on call stated that patient still complains of left side facial pain, despite taking Trileptal '300mg'$  1/2 tid, baclofen '10mg'$  1/2 tid. ? ?I  have advised him to increase her trileptal to '300mg'$  one tab tid. ?

## 2021-08-13 NOTE — Addendum Note (Signed)
Addended by: Minna Antis on: 08/13/2021 04:01 PM ? ? Modules accepted: Orders ? ?

## 2021-08-13 NOTE — Telephone Encounter (Signed)
Attempted to reach Brandy Galloway, no answer and mailbox not taking messages.  ?

## 2021-08-13 NOTE — Telephone Encounter (Addendum)
Called [name] again and informed him of Dr Garth Bigness recommendation: if she is tolerating Trileptal well, I would like to increase to 300 mg 3 times a day (can either take 6 of the 150 mg or 3 of 300 mg that we can call in  #90 with 5 refills) . [name] would like new Rx sent in for increased dose, verbalized understanding, appreciation. ?New Rx sent per Dr Garth Bigness VO. ?.  ?

## 2021-08-14 NOTE — Telephone Encounter (Signed)
Pt's guardian called back and I took his call he relayed the pt's jaw pain is still not improving.  ? ?He states he has not tried the trileptal 300 mg tid yet, he only has been doing twice per day. I reiterated to increase to tid and to call back on Thursday to update [place] on this. ? ?Also advised Dr. Billey Gosling will be back in the office on Monday and we can discuss further.   ?

## 2021-08-14 NOTE — Telephone Encounter (Signed)
IF pt's guardian calls back on this issue please reiterate to give the  ?baclofen '10mg'$  1/2 three times per day. ?  ?trileptal to '300mg'$  one tab three times per day.  ?  ?  ?  ?More time to work and to call back in 2 days to report status. ?

## 2021-08-16 NOTE — Telephone Encounter (Signed)
Noted will discuss with Dr. Billey Gosling on Monday.  ?

## 2021-08-16 NOTE — Telephone Encounter (Signed)
Pt's guardian said pt still having pain in her jaw. Pt taking the Trileptal '300mg'$ . Will continue to give the medication until discuss further with Dr. Billey Gosling on Monday. Would like a call from the nurse to discuss trying another medication. ?

## 2021-08-17 ENCOUNTER — Telehealth: Payer: Self-pay | Admitting: Neurology

## 2021-08-17 MED ORDER — GABAPENTIN 300 MG PO CAPS: 600.0000 mg | ORAL_CAPSULE | Freq: Three times a day (TID) | ORAL | 3 refills | Status: DC

## 2021-08-17 NOTE — Telephone Encounter (Addendum)
Her uncle, also her legal guardian called office, patient continued complaints of significant left lower jaw pain despite Trileptal 300 mg 1 tablet 3 times a day along with baclofen 10 mg 3 times a day ? ? ?1.  I have advised her stop baclofen ?2.  Keep Trileptal 300 mg 3 times a day ?3.  Add on gabapentin 300 mg 1 tablets 3 times daily to begin with, then titrating to 2 tablets 3 times a day ? ?Meds ordered this encounter  ?Medications  ? gabapentin (NEURONTIN) 300 MG capsule  ?  Sig: Take 2 capsules (600 mg total) by mouth 3 (three) times daily.  ?  Dispense:  180 capsule  ?  Refill:  3  ?   ?Late entry: I later received multiple phone calls from her uncle, he stated has stopped taking baclofen, limited causing her nausea, GI side effect ? ?But on Friday evening August 17, 2021, after she was given gabapentin 300 mg 1 tablet, she also have nausea vomiting, ? ?Her uncle called back again on Saturday, August 18, 2021, despite patient continued complaints of pain, but he is so worried about the side effect of the medications, decided to stop them all, including Trileptal, ? ?He hopes patient to be reevaluated sooner at clinic. ?

## 2021-08-20 NOTE — Telephone Encounter (Signed)
Pt's guardian, [name] would like a call from the nurse to discuss pt's jaw pain. ?

## 2021-08-20 NOTE — Telephone Encounter (Signed)
Dr. Krista Blue spoke with the pt on 08/17/2021 and 08/18/2021. Will wait for Dr. Billey Gosling to return to determine next steps.  ? ?

## 2021-08-21 ENCOUNTER — Telehealth: Payer: Self-pay | Admitting: Psychiatry

## 2021-08-21 DIAGNOSIS — M4316 Spondylolisthesis, lumbar region: Secondary | ICD-10-CM | POA: Diagnosis not present

## 2021-08-21 NOTE — Telephone Encounter (Signed)
Is she currently not taking any medications for the jaw pain? We can try something new but I want to make sure I know her current regimen first

## 2021-08-21 NOTE — Telephone Encounter (Signed)
I attempted to reach pt's uncle but he did not answer and vm not working.  ?Will attempt at a later time.  ?Of note there has been several messages on this pt over the last few weeks; I am not sure at this time what medications is being administered because of all the changes that has been made.  ? ?

## 2021-08-21 NOTE — Telephone Encounter (Signed)
Pt uncle states pt lower left jaw pain is still bad.  ?Would like a call back.  ?Pt is no longer taking Gabapentin because pt was losing conscious. ?Pt is taking all other medications.  ? ?

## 2021-08-22 NOTE — Telephone Encounter (Signed)
I attempted to reach the pt's uncle again but he did not answer and vm not set up. Will try again at a later time. ?

## 2021-08-23 ENCOUNTER — Ambulatory Visit: Payer: Medicaid Other | Admitting: Psychiatry

## 2021-08-23 NOTE — Telephone Encounter (Signed)
I attempted to reach the pt's uncle again today. He did not answer and vm still not set up. ? ?

## 2021-08-27 NOTE — Telephone Encounter (Signed)
Attempted to reach the pt's uncle three times last week. Uncle unavailable, no vm, and no return call at this time.  ? ?Will wait for call back from pt's uncle.  ?

## 2021-08-29 NOTE — Progress Notes (Signed)
? ?  CC:  facial pain ? ?Follow-up Visit ? ?Last visit: 08/01/21 ? ?Brief HPI: ?56 year old female with a history of HTN, developmental delay, seizure disorder who follows in clinic for left facial pain.  ? ?Etiology of pain unclear as she and her uncle are poor historians. At her last visit she was empirically started on oxcarbazepine and baclofen for possible trigeminal neuralgia. ? ?Interval History: ?Pain has improved with oxcarbazepine 300 mg BID but has not resolved. She called the office with persistent pain, and oxcarbazepine was increased to 300 mg TID. Continued to have pain so gabapentin 300 mg TID was added. She had nausea and vomiting with gabapentin, so she stopped it. She is currently taking oxcarbazepine 300 mg BID with an extra 300 mg as needed. Has not had side effects with baclofen, but stopped taking it when gabapentin was added. She is doing relatively well with her current dose of oxcarbazepine, but continues to have breakthrough pain. ? ? ?Current Regimen: ?Preventative: oxcarbazepine 300 mg BID + 300 mg PRN ?Rescue: none ? ?Prior Therapies                                  ?Oxcarbazepine ?Gabapentin - nausea and vomiting ?Baclofen 5-10 mg TID PRN ? ?Physical Exam:  ? ?Vital Signs: ?LMP  (LMP Unknown)  ?GENERAL:  well appearing, in no acute distress, alert  ?SKIN:  Color, texture, turgor normal. No rashes or lesions ?HEAD:  Normocephalic/atraumatic. ?RESP: normal respiratory effort ?MSK:  No gross joint deformities.  ? ?NEUROLOGICAL: ?Mental Status: Alert, answers "yes" or "no" questions ?Cranial Nerves: PERRL, face symmetric, no dysarthria, hearing grossly intact ?Motor: moves all extremities equally ?Gait: normal-based. ? ?IMPRESSION: ?56 year old female with a history of HTN, developmental delay, seizure disorder who presents for follow up of left facial pain. She has had some improvement with oxcarbazepine but continues to have breakthrough pain. Will increase dose of oxcarbazepine and  restart baclofen as needed for breakthrough pain. ? ?PLAN: ?-Increase oxcarbazepine to 300/600 x1 week, then increase to 600 mg BID ?-Restart baclofen 5-10 mg TID PRN for breakthrough pain ?-next steps: Consider Lyrica, SNRI. Consider MRI/MRA if pain persists despite treatment.  ? ?Follow-up: 4 months ? ?I spent a total of 19 minutes on the date of the service. Discussed medication side effects, adverse reactions and drug interactions. Written educational materials and patient instructions outlining all of the above were given. ? ?Genia Harold, MD ?08/30/21 ?10:27 AM ? ?

## 2021-08-30 ENCOUNTER — Encounter: Payer: Self-pay | Admitting: Psychiatry

## 2021-08-30 ENCOUNTER — Ambulatory Visit: Payer: Medicaid Other | Admitting: Psychiatry

## 2021-08-30 VITALS — BP 160/91 | HR 74 | Wt 162.2 lb

## 2021-08-30 DIAGNOSIS — R519 Headache, unspecified: Secondary | ICD-10-CM

## 2021-08-30 MED ORDER — BACLOFEN 10 MG PO TABS: ORAL_TABLET | ORAL | 3 refills | Status: DC

## 2021-08-30 MED ORDER — OXCARBAZEPINE 300 MG PO TABS: ORAL_TABLET | ORAL | 3 refills | Status: DC

## 2021-08-30 NOTE — Patient Instructions (Signed)
Increase oxcarbazepine to 1 pill (300 mg) in the morning and 2 pills (600 mg) at bedtime for one week. Then increase to 2 pills twice a day ?Take baclofen as needed for breakthrough pain. Take one pill up to 3 times a day as needed for pain. ?

## 2021-09-03 ENCOUNTER — Telehealth: Payer: Self-pay | Admitting: Neurology

## 2021-09-03 NOTE — Telephone Encounter (Signed)
Spoke with patient's uncle, Mr. Brandy Galloway who reported patient is still complaining of jaw pain. They saw Dr. Billey Gosling on the 5/11 and plan was to increase the Trileptal to 600 mg twice daily and to use baclofen as needed for breakthrough pain.  Patient continues to take Trileptal 300 mg twice daily and Mr. Brandy Galloway reported once he tried the baclofen she did have 1 episode vomiting so he has not been using it. ?I emphasized the dose that was recommended which is 2 tablets twice daily and to use baclofen as needed.  I will forward this message to Dr. Billey Gosling to contact patient this week to discuss neck step. ? ?Dr. April Manson  ?

## 2021-09-07 NOTE — Telephone Encounter (Signed)
Pt's uncle has called to report pt has been taking the medication as prescribed but pt is still having jaw pain.  Barbaraann Rondo is asking for a call on Monday

## 2021-09-10 NOTE — Telephone Encounter (Signed)
Can we confirm she's taking 600 mg (2 pills) BID? If so, she can increase to 600 mg in the morning and 900 mg (3 pills) in the evening

## 2021-09-10 NOTE — Telephone Encounter (Signed)
I called the pt's uncle he confirmed she is taking 600 mg ( 2 pills) bid. He reports some improvement in sx has been noticed but not resolved.   He will increase as instructed to 900 mg in the evening and keep the morning dosage of 600 mg the same. He will call back in a few days to report progress.

## 2021-09-12 ENCOUNTER — Telehealth: Payer: Self-pay | Admitting: Psychiatry

## 2021-09-12 NOTE — Telephone Encounter (Signed)
They can increase the dose to 900 mg (3 pills) twice a day. If she still doesn't have improvement after a few weeks we will need to try a different medication like Lyrica

## 2021-09-12 NOTE — Telephone Encounter (Signed)
Called uncle and advised of Dr Georgina Peer increase in trileptal dose to 900 mg twice daily. He said he is already giving her 600 mg in morning and 900 mg at night. I advised he needs to increase morning dose to 900 mg as well, have her take that dose for about a week and let us know how she is doing. He  verbalized understanding, appreciation.

## 2021-09-12 NOTE — Telephone Encounter (Signed)
Pt's Uncle, [name] on [place] called needing to speak to the RN regarding the pt's baclofen (LIORESAL) 10 MG tablet and her Oxcarbazepine (TRILEPTAL) 300 MG tablet. Uncle states that the medications are not working. It will alleviate the pain a little but about an hour later she is holding her jaw saying her jaw is hurting again. He also states that the day before yesterday she vomited as well. Please advise.

## 2021-09-13 NOTE — Telephone Encounter (Signed)
That's fine, she should stop the baclofen if it makes her nauseated

## 2021-09-13 NOTE — Telephone Encounter (Signed)
Contacted pt uncle back, informed him Dr Billey Gosling is gone for the evening. I will send his concerns to her and see if she has any other recommendations. Did advise him she will be here tomorrow but we do close early and will return after holiday. He verbally understood and was appreciative.

## 2021-09-13 NOTE — Telephone Encounter (Signed)
Pt's guardian, [name], Pt still in pain with jaw. Would like a call from the nurse.

## 2021-09-13 NOTE — Telephone Encounter (Signed)
[employer], voicemail not set up. If he calls back phone staff may tell him Dr Billey Gosling stated it is fine to stop the baclofen if it makes patient nauseated. No further instructions.

## 2021-09-13 NOTE — Telephone Encounter (Signed)
Pt's uncle has called to report that pt has been vomiting since yesterday and today and he believes it is because of the baclofen (LIORESAL) 10 MG tablet.  Pt's uncle states he is not giving her anymore of the baclofen (LIORESAL) 10 MG tablet, please call.

## 2021-09-14 NOTE — Telephone Encounter (Signed)
It can take a few weeks for the new dose of medication to take effect. He should let me know if pain does not improve after 3-4 weeks

## 2021-09-16 ENCOUNTER — Telehealth: Payer: Self-pay | Admitting: Diagnostic Neuroimaging

## 2021-09-16 NOTE — Telephone Encounter (Signed)
Patient having side effects from [place] '900mg'$  twice a day. Had some nausea and vomiting. Pain not improved.   Recommend to reduce to '600mg'$  twice a day. Will let Dr. Billey Gosling know.    Penni Bombard, MD [id number]/[id number], 5:67 PM Certified in Neurology, Neurophysiology and Neuroimaging  [facility] [address], [place],  [address] [phone]

## 2021-09-18 ENCOUNTER — Other Ambulatory Visit: Payer: Self-pay | Admitting: Psychiatry

## 2021-09-18 DIAGNOSIS — G500 Trigeminal neuralgia: Secondary | ICD-10-CM

## 2021-09-18 MED ORDER — PREGABALIN 50 MG PO CAPS: 50.0000 mg | ORAL_CAPSULE | Freq: Two times a day (BID) | ORAL | 6 refills | Status: DC

## 2021-09-18 NOTE — Telephone Encounter (Signed)
I think she should get an MRI since she is continuing to have pain without improvement. It will likely need to be done under sedation. I'll place this order for her. I will also send in a prescription for low dose Lyrica, which she can take in addition to the oxcarbazepine

## 2021-09-18 NOTE — Telephone Encounter (Signed)
[facility] and informed him Dr Billey Gosling has ordered lyrica. She also ordered MRI, MRA which will need to be done at [facility] under sedation. He'll get a call after insurance approves it. He stopped oxcarbazepine today due to her having hot flash. He wants to try lyrica alone. I advised he call for any questions, concerns. He  verbalized understanding, appreciation.

## 2021-09-18 NOTE — Telephone Encounter (Signed)
Pt's uncle called back stating that he had made the changes that were advised to him to give Oxcarbazepine (TRILEPTAL) 300 MG tablet to the pt BID instead of the TID.  But uncle states that the pt continues to feel really hot and have to stand in front of the fan to cool off and after all that the pt is still not feeling any relief of her pain. They would like to know if there can be a change of medication to something else.

## 2021-09-19 ENCOUNTER — Telehealth: Payer: Self-pay | Admitting: Neurology

## 2021-09-19 NOTE — Telephone Encounter (Signed)
I spoke to her legal guardian, Charlotte Crumb.  She was still having a lot of facial pain and pregabalin was started.  Her first pill was yesterday.  He reports that she has not noted any improvement.  I let him know that the medication needs to build up and can take a week or more to kick in.  50 mg twice daily was the prescribed dose.  I let him know that if she tolerates that dose well he can increase it to 3 pills a day.

## 2021-09-21 ENCOUNTER — Telehealth: Payer: Self-pay | Admitting: Neurology

## 2021-09-21 NOTE — Telephone Encounter (Signed)
I spoke again to her legal guardian, Brandy Galloway.  2 days ago, I spoke to him when she was still experiencing pain.  She had just been started on Lyrica 50 mg twice daily and I asked him to increase the dose to 1 in the morning and 2 at night.  He stated that she slept a little bit better but when she woke up she was still having intense pain  I again let him know that the medicines do not work instantaneously for pain and that it would likely be a few more days before it kicks in more effectively.  Because she is tolerating the Lyrica fairly well they can go up to 4 pills a day (1 in the morning, 1 in the afternoon and 2 at bedtime).

## 2021-09-24 ENCOUNTER — Telehealth: Payer: Self-pay | Admitting: *Deleted

## 2021-09-24 ENCOUNTER — Telehealth: Payer: Self-pay | Admitting: Psychiatry

## 2021-09-24 NOTE — Telephone Encounter (Signed)
Please see phone note dated today.

## 2021-09-24 NOTE — Telephone Encounter (Signed)
medicaid NPR sent to [facility] for general anesthesia. H&P paperwork given to pod 3

## 2021-09-24 NOTE — Telephone Encounter (Signed)
H&P for adult sedation form completed, signed and sent to MRI team for processing.

## 2021-09-24 NOTE — Telephone Encounter (Signed)
Form from [facility] re: H&P for Adult sedation procedures on Dr Georgina Peer desk for completion,signature.

## 2021-09-24 NOTE — Telephone Encounter (Signed)
I faxed the form to [facility] radiology

## 2021-09-26 IMAGING — MR MR LUMBAR SPINE W/O CM
4 series · 25 of 48 positions shown · non-contrast
Comparison: CT lumbar spine dated September 10, 2018.

CLINICAL DATA: Chronic low back pain radiating into both legs with
leg weakness and difficulty ambulating.

EXAM:
MRI LUMBAR SPINE WITHOUT CONTRAST
TECHNIQUE: Multiplanar, multisequence MR imaging of the lumbar spine was
performed. Only sagittal T2, sagittal T1, and axial T2 sequences
were obtained at the request of the referring physician due to the
patient's mental handicap. No intravenous contrast was administered.

[Series 3: T2 · sagittal · 4.0mm · 0.55mm/px · 9 of 16 slices shown (1 of 3)]
[im 1/16]
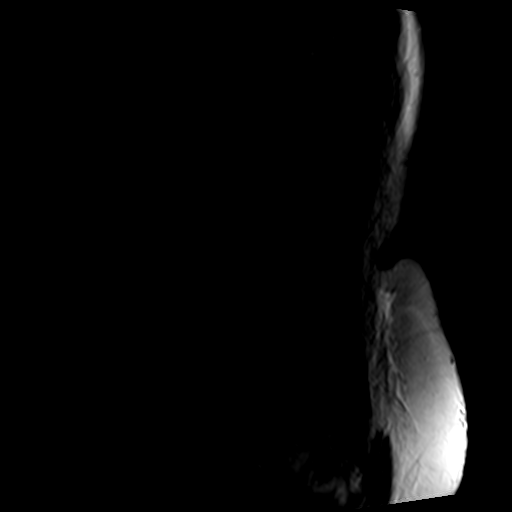
[im 2/16]
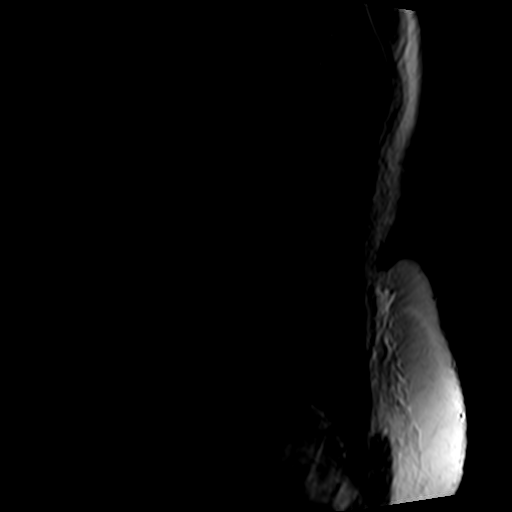
[im 4/16]
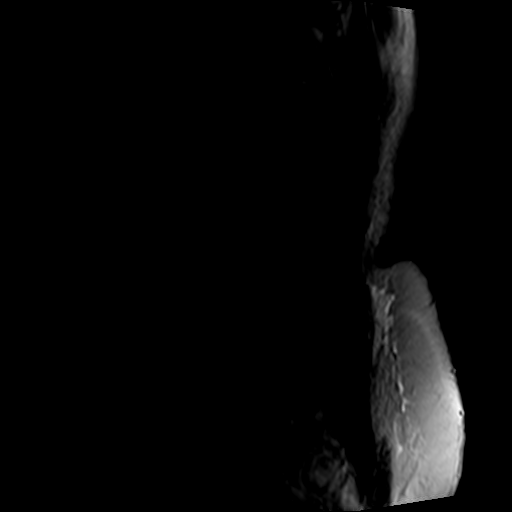
[im 6/16]
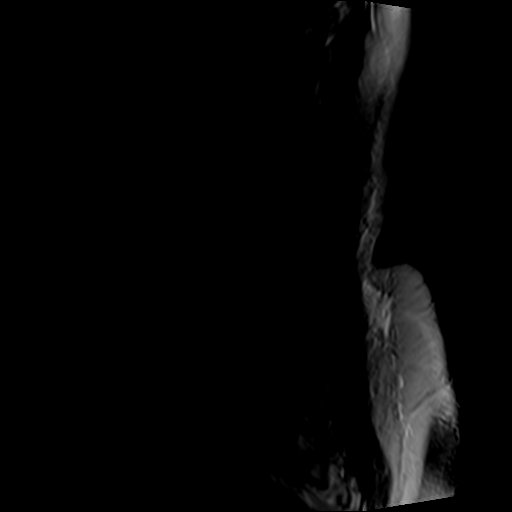
[im 8/16]
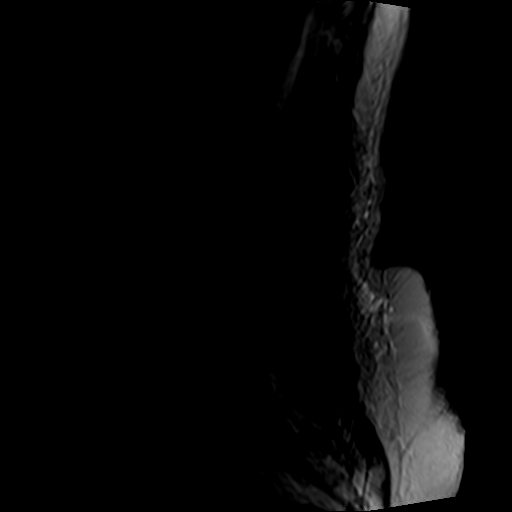
[im 10/16]
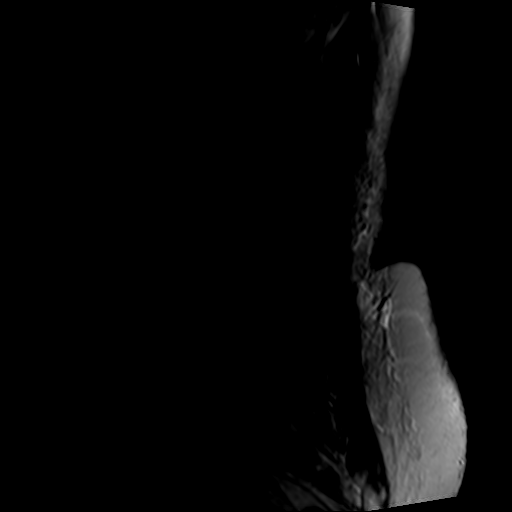
[im 12/16]
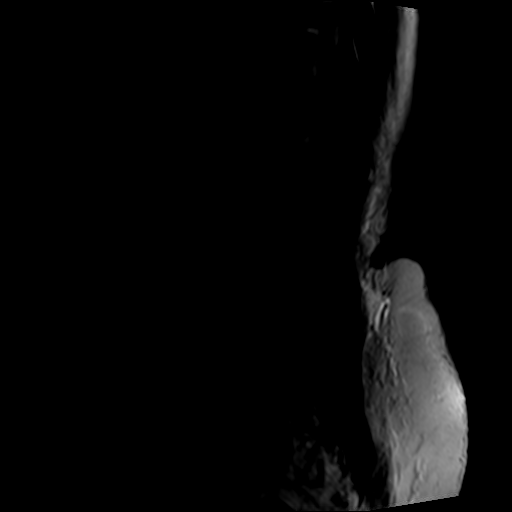
[im 14/16]
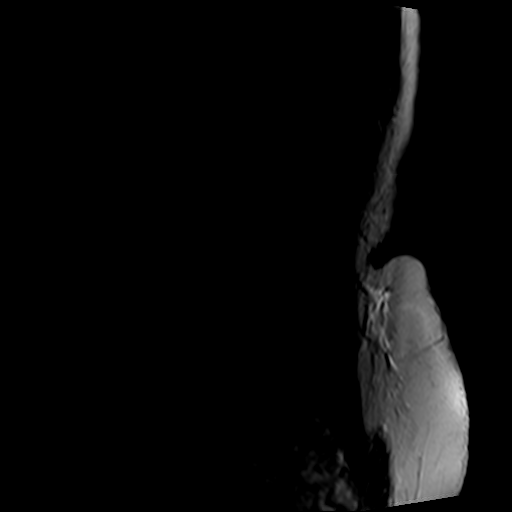
[im 16/16]
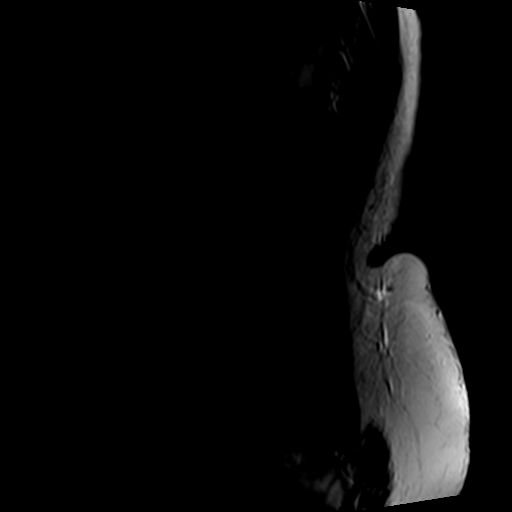

[Series 7: T1 · sagittal · 4.0mm · 0.55mm/px · 3 of 16 slices shown]
[im 2/16]
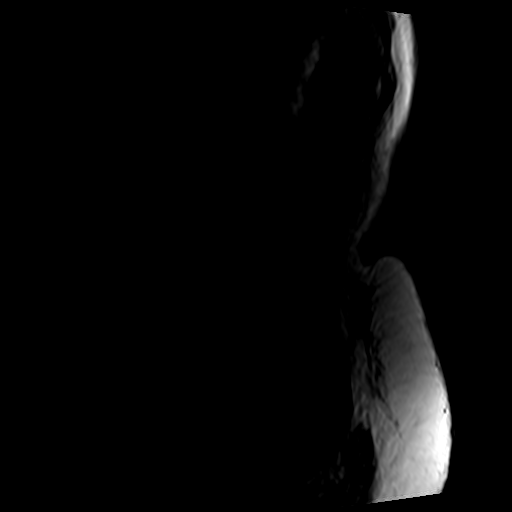
[im 8/16]
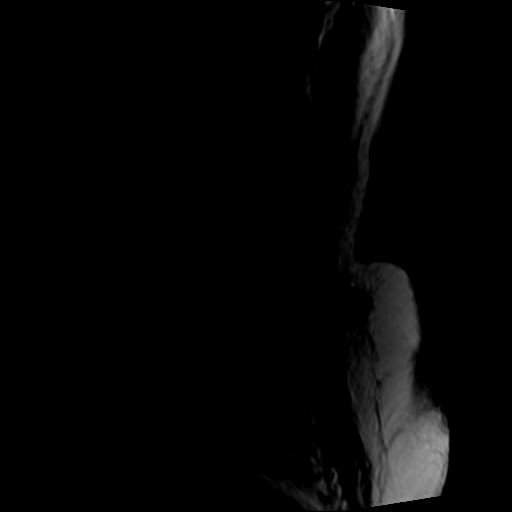
[im 14/16]
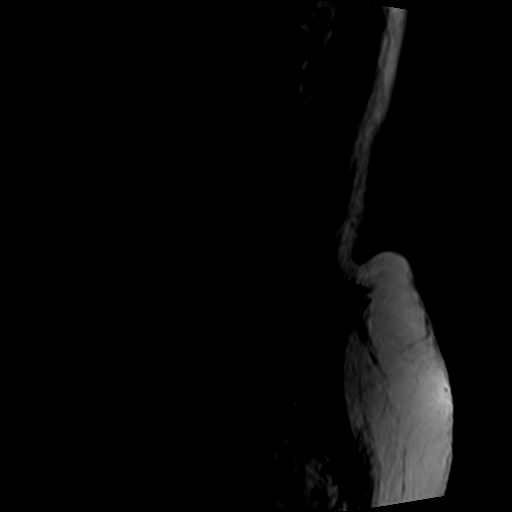

[Series 9: T2 · axial · 4.0mm · 0.78mm/px · z∈[-60,+130]mm · 9 of 37 slices shown (2 of 3)]
[im 2/37]
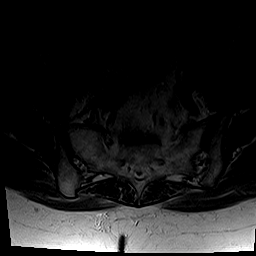
[im 6/37]
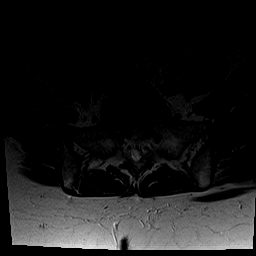
[im 11/37]
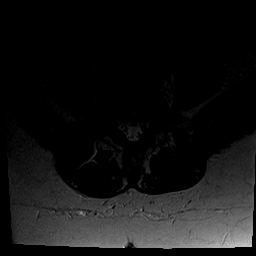
[im 17/37]
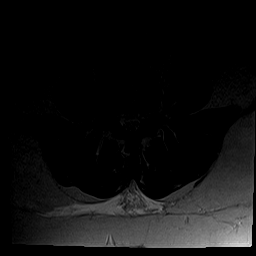
[im 19/37]
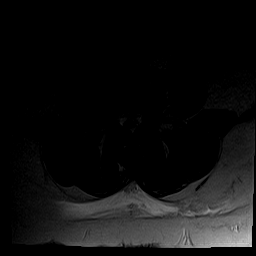
[im 20/37]
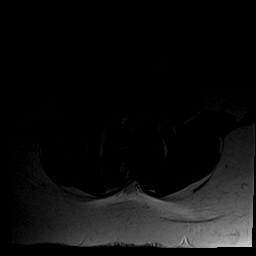
[im 26/37]
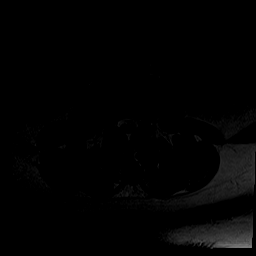
[im 31/37]
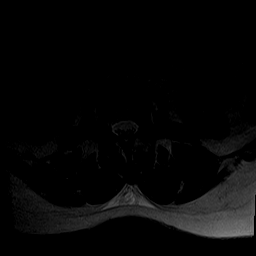
[im 35/37]
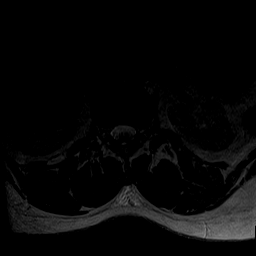

[Series 10: T2 · sagittal · 4.0mm · 0.55mm/px · 4 of 16 slices shown (3 of 3)]
[im 1/16]
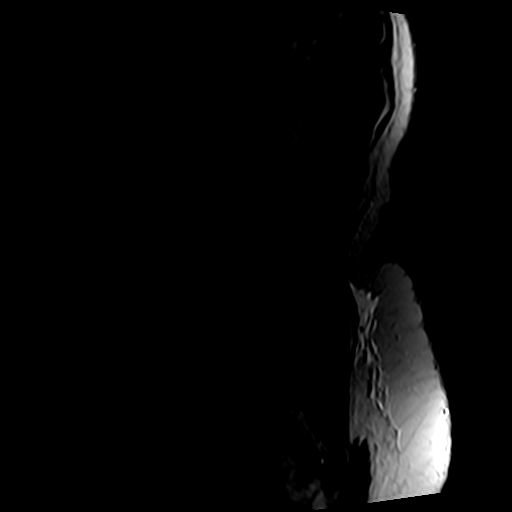
[im 2/16]
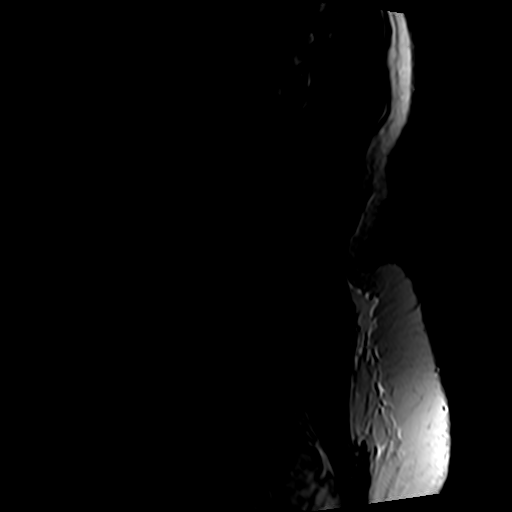
[im 8/16]
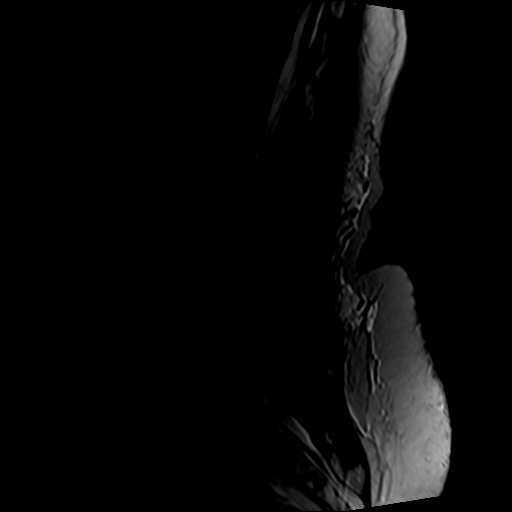
[im 14/16]
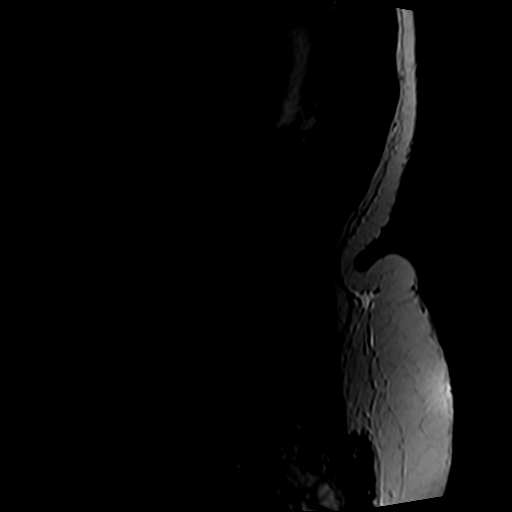

[25 of 48 positions shown; findings below may reference images not displayed]

FINDINGS: Examination is limited by motion artifact.

Segmentation:  Standard.

Alignment:  Physiologic.

Vertebrae:  No fracture, evidence of discitis, or bone lesion.

Conus medullaris and cauda equina: Conus extends to the L1-L2 level.
Conus and cauda equina appear normal.

Paraspinal and other soft tissues: Negative.

Disc levels:

T12-L1:  Negative.

L1-L2:  Negative.

L2-L3: Mild diffuse disc bulging. Mild spinal canal and bilateral
lateral recess stenosis. No neuroforaminal stenosis.

L3-L4: Mild diffuse disc bulging. Moderate spinal canal stenosis.
Mild to moderate right and mild left lateral recess stenosis. Mild
right neuroforaminal stenosis. No left neuroforaminal stenosis.

L4-L5: Moderate diffuse disc bulging and mild bilateral facet
arthropathy. Moderate spinal canal and right greater than left
lateral recess stenosis. No neuroforaminal stenosis.

L5-S1:  Negative.
IMPRESSION: 1. Limited study. Multilevel lumbar spondylosis as described above.
Moderate spinal canal stenosis at L3-L4 and L4-L5.

## 2021-10-05 ENCOUNTER — Other Ambulatory Visit: Payer: Self-pay | Admitting: Neurology

## 2021-10-05 ENCOUNTER — Telehealth: Payer: Self-pay | Admitting: Psychiatry

## 2021-10-05 MED ORDER — PREGABALIN 50 MG PO CAPS: 100.0000 mg | ORAL_CAPSULE | Freq: Two times a day (BID) | ORAL | 6 refills | Status: DC

## 2021-10-05 NOTE — Telephone Encounter (Signed)
Pt's uncle states pt is out of the pregabalin (LYRICA) 50 MG capsule due to taking the medication twice a day.  Pt's uncle states pt is crying now due to jaw pain.  Pt's uncle states he needs the on call Dr to please call the pregabalin (LYRICA) 50 MG capsule into  [place] [phone]

## 2021-10-08 NOTE — Telephone Encounter (Signed)
Pt's uncle, [name] (on [place]) would like a call from the nurse to discuss pt still having jaw pain.

## 2021-10-08 NOTE — Telephone Encounter (Signed)
Faxed h&p forms with todays date to be within 30 days of appointment

## 2021-10-08 NOTE — Telephone Encounter (Signed)
Pt Uncle called this morning and stated that medicine is helping some but Pt is still in pain. And he would like a call back from the nurse.

## 2021-10-09 ENCOUNTER — Other Ambulatory Visit: Payer: Self-pay | Admitting: Psychiatry

## 2021-10-09 MED ORDER — PREGABALIN 50 MG PO CAPS: 150.0000 mg | ORAL_CAPSULE | Freq: Two times a day (BID) | ORAL | 5 refills | Status: DC

## 2021-10-09 NOTE — Telephone Encounter (Signed)
Uncle stated pharmacy didn't receive Rx. Contacted pharmacy to verify receipt, Rx is 10 days to soon to receive. Can pick up 10/22/21

## 2021-10-09 NOTE — Telephone Encounter (Signed)
Pt's uncle would like a call from the nurse today concerning jaw pain.

## 2021-10-09 NOTE — Telephone Encounter (Signed)
Relayed message uncle verbalize he understand.

## 2021-10-09 NOTE — Telephone Encounter (Signed)
We can increase her dose to 150 mg (3 pills) twice a day if she's still having pain. I'll send in a new prescription

## 2021-10-09 NOTE — Telephone Encounter (Signed)
PHONE RM CAN RELAY Contacted pt uncle back, no answer   We can increase her dose to 150 mg (3 pills) twice a day if she's still having pain. Give up to a week for increase to help.

## 2021-10-10 NOTE — Telephone Encounter (Signed)
Pt's uncle would like a call from the nurse to discuss pt still having jaw pain.

## 2021-10-10 NOTE — Telephone Encounter (Signed)
Called uncle who stated he started lyrica 150 mg twice a day but when she wakes up she complains of pain. I advised him it may take several days before full effect is noticed. I advised he try to distract her with things she enjoys os she doesn't focus on pain. He stated will try for a few more days and let us know how she is doing. He verbalized understanding, appreciation.

## 2021-10-12 ENCOUNTER — Other Ambulatory Visit: Payer: Self-pay | Admitting: Neurology

## 2021-10-12 NOTE — Telephone Encounter (Signed)
Her uncle Jetta Lout called on-call physician on October 11 2021, reporting patient to continue moaning for jaw pain, despite Lyrica 50 mg 3 tablets twice a day,  He wonders, if she needs a different medicine to better control her jaw pain,

## 2021-10-16 ENCOUNTER — Other Ambulatory Visit: Payer: Self-pay | Admitting: Psychiatry

## 2021-10-16 ENCOUNTER — Telehealth: Payer: Self-pay | Admitting: Neurology

## 2021-10-16 MED ORDER — PREGABALIN 50 MG PO CAPS: ORAL_CAPSULE | ORAL | 5 refills | Status: DC

## 2021-10-16 MED ORDER — PREGABALIN 150 MG PO CAPS: ORAL_CAPSULE | ORAL | 5 refills | Status: DC

## 2021-10-16 NOTE — Telephone Encounter (Signed)
Called Brandy Galloway and advised him of new lyrica Rx sent in. I advised he wait a while before picking it up. He verbalized understanding, appreciation.

## 2021-10-17 ENCOUNTER — Other Ambulatory Visit: Payer: Self-pay | Admitting: Neurology

## 2021-10-17 MED ORDER — PREGABALIN 75 MG PO CAPS: ORAL_CAPSULE | ORAL | 0 refills | Status: DC

## 2021-10-17 NOTE — Telephone Encounter (Signed)
Per phone staff Mr Brandy Galloway called, stated he can't pick up Rx until 6/30.  Called pharmacy, on hold > 12 minutes, will call back.

## 2021-10-17 NOTE — Telephone Encounter (Signed)
Roderic Palau @ [place] [phone] is asking for a call re: pt's pregabalin (LYRICA) 50 MG capsule

## 2021-10-17 NOTE — Telephone Encounter (Addendum)
Called [facility], spoke with Roderic Palau pharmacist to clarify Lyrica Rx, 50 mg tab, take 3 in am, 1 at afternoon and 3 at bedtime. Roderic Palau  verbalized understanding, appreciation, stated they will get Rx ready.  Went to lobby and spoke with [name] advising him Roderic Palau at [place] is getting Rx ready for him to pick up. He  verbalized understanding, appreciation.

## 2021-10-17 NOTE — Telephone Encounter (Signed)
I know this patient and the uncle well. Medication sent to the pharmacy: Pregabalin 75 mg 2/1/2 for 2 days for a total of 10 tablets.

## 2021-10-17 NOTE — Telephone Encounter (Signed)
Pt uncle came in office, claims pt is needing (LYRICA) 59 MG from [facility] desperately.

## 2021-10-17 NOTE — Telephone Encounter (Signed)
Roderic Palau called back, stated insurance will not allow early fill on controlled substance. Only option is to pay out of pocket $90 for two day supply. He suggested a new Rx be sent for Pregabalin 75 mg, take  two in am,  one in afternoon,  two at bedtime. He ran test claim and Rx went through. I informed him Dr Billey Gosling is out of office, will send to work in [place] and hopefully new Rx can be sent today.  Roderic Palau verbalized understanding, appreciation.

## 2021-10-19 ENCOUNTER — Telehealth: Payer: Self-pay | Admitting: Neurology

## 2021-10-19 NOTE — Telephone Encounter (Signed)
Patient's uncle called requesting prescription change.  I reviewed the chart, pregabalin prescription was done again on 10/17/2021 by Dr. April Manson.  I tried calling twice, no one picked up the phone and voicemail was not set up.  I sent a message through the on-call app requesting that medication changes be discussed with patient's primary neurologist during the work week and that the prepregabalin prescription was taking care of yesterday.

## 2021-10-22 ENCOUNTER — Other Ambulatory Visit: Payer: Self-pay | Admitting: Neurology

## 2021-10-22 ENCOUNTER — Telehealth: Payer: Self-pay | Admitting: Neurology

## 2021-10-22 MED ORDER — PREGABALIN 200 MG PO CAPS: 200.0000 mg | ORAL_CAPSULE | Freq: Three times a day (TID) | ORAL | 3 refills | Status: DC

## 2021-10-22 NOTE — Telephone Encounter (Signed)
Patient's guardian, [name], called again the after-hours call center at 4 PM yesterday, 10/21/2021 with message that medication given for jaw is not working.  I replied back on the app stating that medication changes can be addressed with her primary neurologist, Dr. Billey Gosling when she returns this week. Please review last phone notes, thanks.

## 2021-10-22 NOTE — Telephone Encounter (Signed)
Brandy Galloway called. Patient is still in extreme jaw pain(no changes in quality similar pain, no new symptoms). 3 medications tried, high dose Trileptal,baclofen,high-dose Lyrica. MRI not for another 10 days. I advised since she is in so much pain for so long I would advise going to the ED, I see over 40 calls about pain since appointment 08/30/2021. I highly recommended ED but he refused, I advised she may have an infection or other dangerous etiology but he still declined. In the meantime I can increase her lyrica to '200mg'$  tid which is highest dose (now she is on '150mg'$  tid per report by Brandy Galloway). I advised to watch the dosage, I will increase the pill mg to '200mg'$  so one cap three times a day. I also called the pharmacy to have them cancel any prior Lyrica prescriptions to avoid confusion(she has been given 50 and '75mg'$  tabs in the past) and also for pharmacist to make sure they know she will be taking one pill 3x a day (not multiple pills as before). Again, I think she should go to the ED, that is the only place she can get immediate medical attention and immediate imaging.

## 2021-10-23 ENCOUNTER — Telehealth: Payer: Self-pay | Admitting: Neurology

## 2021-10-23 NOTE — Telephone Encounter (Signed)
Mr. Brandy Galloway paged the on-call 2nd time today asking for pain medications for patient. I see over 40 phone calls for same. He says he needs something stronger and I asked if he meant pain pills like oxycodone and he said yes. I told him again if she is is in that much pain go to ED. He asked if the doctor tomorrow could give Erendida some pain pills she is going to need something strong. Asked him to go to ED.

## 2021-10-24 NOTE — Telephone Encounter (Signed)
Pt Uncle called and stated medication seem to be working on Pt and he will be calling and keep [place] updated.

## 2021-10-25 ENCOUNTER — Telehealth: Payer: Self-pay | Admitting: Neurology

## 2021-10-25 NOTE — Telephone Encounter (Signed)
Mr. Wynetta Emery has paged the on-call multiple times in the evenings this week.  I tried to explain to him that I am not his primary neurologist, that he should be talking to his neurologist during working hours, the office closes at 5 PM.  I have also explained to him that the medications take time to work(I increased lyrica 2 nights ago), I have also advised him to go to the emergency room and he has not followed my medical advice.  He appears to call after hours, he is requesting "pain medications".  He states if we do not give him pain medications he will take all the medications that he has down to the hospital.  I encouraged him to do exactly that, please take patient to the emergency room if she is in that much pain.  He also does not feel as though he is receiving the care he wants.  I did explain to him that we can refer him to another neurology group or pain management but I count over 50 phone calls many of them after hours. He raised his voice, I asked him to call back during office hours.

## 2021-10-26 NOTE — Telephone Encounter (Signed)
[name] came into the office today and wanted to complain about how Dr. Jaynee Eagles spoke to him on the phone. He did not like that she "said she can't do anything for [facility] and hung up." He says that she is still in a lot of pain since her jaw surgery and the pregabalin and oxcarbazepine are not working for her. He states that the patient is in a lot of pain and thinks that she has been on the medication long enough that it would be working by now if it was going to help her. He is asking that Dr. Billey Gosling call him and give different medication that will help with the nerve pain in her jaw.

## 2021-10-27 ENCOUNTER — Telehealth: Payer: Self-pay | Admitting: Neurology

## 2021-10-27 NOTE — Telephone Encounter (Signed)
Patient's family called AGAIN. This is the 5th time Mr. Brandy Galloway has paged the oncall this week despite asking him to call during office hours and speak with his primary care team - and there are about 50+ phone messages listed for pain medication. I have already explained to famly mutiple times I cannot help him with pain medication as the on-call evening doctor, I have adjusted patient's lyrica(team has tried multiple meds as well) and explained the rationale, I cannot provide opioids, I have advised if patient is still in that much pain to go to the emergency room and they have repeatedly kept calling and have not followed medical advice. I recommend dismissal from our practice, this is abuse of the on call system and they are non compliant with my recommendations.

## 2021-10-30 ENCOUNTER — Telehealth: Payer: Self-pay | Admitting: Psychiatry

## 2021-10-30 ENCOUNTER — Encounter: Payer: Self-pay | Admitting: Psychiatry

## 2021-10-30 NOTE — Telephone Encounter (Signed)
[facility]) calling for PA for MRI.  [place] MM:[id number]  Effective date:10/30/21-8-25/23 at [place] Case no: [id number]

## 2021-10-30 NOTE — Telephone Encounter (Signed)
Pt uncle called and ask when was Pt next appointment with Dr. Billey Gosling. I then notified pt uncle that she was dismissed from the clinic. He then ask why and I explained to him I didn't know why but I would put a message in the system and have someone call him and explain. He then replied loudly " I want to talk to someone now. I said Sir the clinic Freight forwarder is seeing patients and she can't talk to you right this second. He then replied " why are they doing her like this she haven't done anything to anybody. He then went on to say " I will just come up there because somebody is going to tell me something today. I then explained to him again that the office was very busy and the clinic manager may not be available. He then hung up the phone.

## 2021-10-30 NOTE — Telephone Encounter (Signed)
[facility]) calling for PA for MRI.  [place] QZ:[id number]  Effective date:10/30/21-8-25/23 at [place] Case no: [id number]

## 2021-11-04 ENCOUNTER — Emergency Department (HOSPITAL_COMMUNITY)
Admission: EM | Admit: 2021-11-04 | Discharge: 2021-11-04 | Disposition: A | Discharge status: Home / Self Care | Payer: Medicaid Other | Attending: Emergency Medicine | Admitting: Emergency Medicine

## 2021-11-04 ENCOUNTER — Other Ambulatory Visit: Payer: Self-pay

## 2021-11-04 ENCOUNTER — Encounter (HOSPITAL_COMMUNITY): Payer: Self-pay | Admitting: Emergency Medicine

## 2021-11-04 DIAGNOSIS — R519 Headache, unspecified: Secondary | ICD-10-CM | POA: Diagnosis present

## 2021-11-04 DIAGNOSIS — Z5321 Procedure and treatment not carried out due to patient leaving prior to being seen by health care provider: Secondary | ICD-10-CM | POA: Insufficient documentation

## 2021-11-04 DIAGNOSIS — G501 Atypical facial pain: Secondary | ICD-10-CM | POA: Diagnosis not present

## 2021-11-04 NOTE — ED Provider Triage Note (Signed)
Emergency Medicine Provider Triage Evaluation Note  MEEYA GOLDIN , a 56 y.o. female  was evaluated in triage.  Pt complains of chronic facial pain.  Patients uncle provides history.  Her uncle reports that they were told if her pain is uncontrolled to go to the ED.     Physical Exam  BP (!) 182/96 (BP Location: Right Arm)   Pulse 61   Temp 98 F (36.7 C) (Oral)   Resp 18   LMP  (LMP Unknown)   SpO2 100%  Gen:   Awake, no distress   Resp:  Normal effort  MSK:   Moves extremities without difficulty  Other:  Patient is awake  Medical Decision Making  Medically screening exam initiated at 1:05 PM.  Appropriate orders placed.  BRANNA CORTINA was informed that the remainder of the evaluation will be completed by another provider, this initial triage assessment does not replace that evaluation, and the importance of remaining in the ED until their evaluation is complete.  Patient with chronic pain   Lorin Glass, [place] 11/04/21 1307

## 2021-11-04 NOTE — ED Triage Notes (Addendum)
Pt with developmental delay.  Family answering questions.  C/o L jaw pain since having bone filed in March for dentures.  Family states she has seen neurologist with and taking medications prescribed without improvement.  According to chart pt was dismissed from neurologist practice after misusing the on-call system.  Neurologist office advised family to bring her to ED if pain severe.

## 2021-11-04 NOTE — ED Notes (Signed)
Pt and husband are not were they were previous. No response to being called for VS.

## 2021-11-05 ENCOUNTER — Encounter (HOSPITAL_COMMUNITY): Payer: Self-pay | Admitting: Psychiatry

## 2021-11-05 ENCOUNTER — Other Ambulatory Visit: Payer: Self-pay

## 2021-11-05 NOTE — Telephone Encounter (Signed)
The hospital called to make sure our office will follow up with the patient after her scans since she has been dismissed from our office. Dr. Billey Gosling agreed to follow up after the MRIs.

## 2021-11-05 NOTE — Pre-Procedure Instructions (Signed)
  [id number] [address], [place] [address] Phone: [phone] Fax: [phone]   PCP - Dr. Criss Rosales Cardiologist - Dr. Vernell Leep   EKG - 06/29/21  ERAS Protcol - Clears until 0600  Anesthesia review: N  Patient verbally denies any shortness of breath, fever, cough and chest pain during phone call   -------------  SDW INSTRUCTIONS given:  Your procedure is scheduled on 11/06/21.  Report to [facility] Main Entrance "A" at [place].M., and check in at the Admitting office.  Call this number if you have problems the morning of surgery:  [phone]   Remember:  Do not eat after midnight the night before your surgery  You may drink clear liquids until 0600 the morning of your surgery.   Clear liquids allowed are: Water, Non-Citrus Juices (without pulp), Carbonated Beverages, Clear Tea, Black Coffee Only, and Gatorade    Take these medicines the morning of surgery with A SIP OF WATER  hydrALAZINE (APRESOLINE)  metoprolol tartrate (LOPRESSOR)  Oxcarbazepine (TRILEPTAL) pregabalin (LYRICA)  acetaminophen (TYLENOL)-if needed  As of today, STOP taking any and all herbal medications, fish oil, and all vitamins.                      Do not wear jewelry, make up, or nail polish            Do not wear lotions, powders, perfumes/colognes, or deodorant.            Do not shave 48 hours prior to surgery.  Men may shave face and neck.            Do not bring valuables to the hospital.            [facility] is not responsible for any belongings or valuables.  Do NOT Smoke (Tobacco/Vaping) 24 hours prior to your procedure If you use a CPAP at night, you may bring all equipment for your overnight stay.   Contacts, glasses, dentures or bridgework may not be worn into surgery.      For patients admitted to the hospital, discharge time will be determined by your treatment  team.   Patients discharged the day of surgery will not be allowed to drive home, and someone needs to stay with them for 24 hours.   Day of Surgery: Please shower morning of surgery  Wear Clean/Comfortable clothing the morning of surgery Do not apply any deodorants/lotions.   Remember to brush your teeth WITH YOUR REGULAR TOOTHPASTE.   Questions were answered. Patient verbalized understanding of instructions.

## 2021-11-05 NOTE — Telephone Encounter (Signed)
The previous note was made in error, that authorization was for a different scan.  MRI brain w/wo contrast [facility] medicaid Josem Kaufmann: [id number] exp. 11/05/21-12/20/21 MRA head wo contrast [facility] medicaid auth: [id number] exp. 11/05/21-12/20/21 both for [employer].

## 2021-11-06 ENCOUNTER — Ambulatory Visit (HOSPITAL_COMMUNITY)
Admission: RE | Admit: 2021-11-06 | Discharge: 2021-11-06 | Disposition: A | Discharge status: Home / Self Care | Payer: Medicaid Other | Source: Ambulatory Visit | Attending: Psychiatry | Admitting: Psychiatry

## 2021-11-06 ENCOUNTER — Ambulatory Visit (HOSPITAL_COMMUNITY)
Admission: RE | Admit: 2021-11-06 | Discharge: 2021-11-06 | Disposition: A | Discharge status: Home / Self Care | Payer: Medicaid Other | Attending: Psychiatry | Admitting: Psychiatry

## 2021-11-06 ENCOUNTER — Ambulatory Visit (HOSPITAL_BASED_OUTPATIENT_CLINIC_OR_DEPARTMENT_OTHER): Payer: Medicaid Other

## 2021-11-06 ENCOUNTER — Other Ambulatory Visit: Payer: Self-pay | Admitting: Psychiatry

## 2021-11-06 ENCOUNTER — Encounter (HOSPITAL_COMMUNITY): Payer: Self-pay | Admitting: Psychiatry

## 2021-11-06 ENCOUNTER — Other Ambulatory Visit: Payer: Self-pay

## 2021-11-06 ENCOUNTER — Ambulatory Visit (HOSPITAL_COMMUNITY): Payer: Medicaid Other

## 2021-11-06 ENCOUNTER — Encounter (HOSPITAL_COMMUNITY)
Admission: RE | Disposition: A | Discharge status: Home / Self Care | Payer: Self-pay | Source: Home / Self Care | Attending: Psychiatry

## 2021-11-06 DIAGNOSIS — G44009 Cluster headache syndrome, unspecified, not intractable: Secondary | ICD-10-CM | POA: Diagnosis not present

## 2021-11-06 DIAGNOSIS — G500 Trigeminal neuralgia: Secondary | ICD-10-CM

## 2021-11-06 DIAGNOSIS — F79 Unspecified intellectual disabilities: Secondary | ICD-10-CM | POA: Insufficient documentation

## 2021-11-06 DIAGNOSIS — R569 Unspecified convulsions: Secondary | ICD-10-CM | POA: Diagnosis not present

## 2021-11-06 DIAGNOSIS — F419 Anxiety disorder, unspecified: Secondary | ICD-10-CM | POA: Diagnosis not present

## 2021-11-06 DIAGNOSIS — I10 Essential (primary) hypertension: Secondary | ICD-10-CM | POA: Insufficient documentation

## 2021-11-06 DIAGNOSIS — G939 Disorder of brain, unspecified: Secondary | ICD-10-CM | POA: Diagnosis not present

## 2021-11-06 DIAGNOSIS — R519 Headache, unspecified: Secondary | ICD-10-CM | POA: Diagnosis not present

## 2021-11-06 HISTORY — PX: RADIOLOGY WITH ANESTHESIA: SHX6223

## 2021-11-06 LAB — CBC
HCT: 34.8 % — ABNORMAL LOW (ref 36.0–46.0)
Hemoglobin: 11.6 g/dL — ABNORMAL LOW (ref 12.0–15.0)
MCH: 30.7 pg (ref 26.0–34.0)
MCHC: 33.3 g/dL (ref 30.0–36.0)
MCV: 92.1 fL (ref 80.0–100.0)
Platelets: 282 10*3/uL (ref 150–400)
RBC: 3.78 MIL/uL — ABNORMAL LOW (ref 3.87–5.11)
RDW: 12.9 % (ref 11.5–15.5)
WBC: 7.3 10*3/uL (ref 4.0–10.5)
nRBC: 0 % (ref 0.0–0.2)

## 2021-11-06 LAB — BASIC METABOLIC PANEL
Anion gap: 10 (ref 5–15)
BUN: 21 mg/dL — ABNORMAL HIGH (ref 6–20)
CO2: 24 mmol/L (ref 22–32)
Calcium: 9.5 mg/dL (ref 8.9–10.3)
Chloride: 108 mmol/L (ref 98–111)
Creatinine, Ser: 1.14 mg/dL — ABNORMAL HIGH (ref 0.44–1.00)
GFR, Estimated: 56 mL/min — ABNORMAL LOW (ref >60)
Glucose, Bld: 91 mg/dL (ref 70–99)
Potassium: 3.9 mmol/L (ref 3.5–5.1)
Sodium: 142 mmol/L (ref 135–145)

## 2021-11-06 SURGERY — MRI WITH ANESTHESIA
Anesthesia: General

## 2021-11-06 MED ORDER — MIDAZOLAM HCL 2 MG/2ML IJ SOLN
INTRAMUSCULAR | Status: DC | PRN
  Administered 2021-11-06: 2 mg via INTRAVENOUS

## 2021-11-06 MED ORDER — DEXAMETHASONE SODIUM PHOSPHATE 10 MG/ML IJ SOLN
INTRAMUSCULAR | Status: DC | PRN
  Administered 2021-11-06: 10 mg via INTRAVENOUS

## 2021-11-06 MED ORDER — SUCCINYLCHOLINE CHLORIDE 200 MG/10ML IV SOSY
PREFILLED_SYRINGE | INTRAVENOUS | Status: DC | PRN
  Administered 2021-11-06: 140 mg via INTRAVENOUS

## 2021-11-06 MED ORDER — CHLORHEXIDINE GLUCONATE 0.12 % MT SOLN
15.0000 mL | Freq: Once | OROMUCOSAL | Status: AC
  Administered 2021-11-06: 15 mL via OROMUCOSAL

## 2021-11-06 MED ORDER — ONDANSETRON HCL 4 MG/2ML IJ SOLN
INTRAMUSCULAR | Status: DC | PRN
  Administered 2021-11-06: 4 mg via INTRAVENOUS

## 2021-11-06 MED ORDER — LACTATED RINGERS IV SOLN: INTRAVENOUS | Status: DC

## 2021-11-06 MED ORDER — LIDOCAINE 2% (20 MG/ML) 5 ML SYRINGE
INTRAMUSCULAR | Status: DC | PRN
  Administered 2021-11-06: 60 mg via INTRAVENOUS

## 2021-11-06 MED ORDER — ORAL CARE MOUTH RINSE: 15.0000 mL | Freq: Once | OROMUCOSAL | Status: AC

## 2021-11-06 MED ORDER — PROPOFOL 10 MG/ML IV BOLUS
INTRAVENOUS | Status: DC | PRN
  Administered 2021-11-06 (×2): 50 mg via INTRAVENOUS
  Administered 2021-11-06: 150 mg via INTRAVENOUS

## 2021-11-06 MED ORDER — HYDRALAZINE HCL 20 MG/ML IJ SOLN: 5.0000 mg | INTRAMUSCULAR | Status: DC | PRN

## 2021-11-06 MED ORDER — PHENYLEPHRINE HCL-NACL 20-0.9 MG/250ML-% IV SOLN
INTRAVENOUS | Status: DC | PRN
  Administered 2021-11-06: 20 ug/min via INTRAVENOUS

## 2021-11-06 NOTE — Anesthesia Postprocedure Evaluation (Signed)
Anesthesia Post Note  Patient: Brandy Galloway  Procedure(s) Performed: MRI WITH ANESTHESIA OF BRAIN WITH AND WITHOUT CONTRAST, MR ANGIOGRAM OF HEAD WITHOUT CONTRAST     Patient location during evaluation: PACU Anesthesia Type: General Level of consciousness: awake and alert, oriented and patient cooperative Pain management: pain level controlled Vital Signs Assessment: post-procedure vital signs reviewed and stable Respiratory status: spontaneous breathing, nonlabored ventilation and respiratory function stable Cardiovascular status: blood pressure returned to baseline and stable Postop Assessment: no apparent nausea or vomiting Anesthetic complications: no   No notable events documented.  Last Vitals:  Vitals:   11/06/21 1100 11/06/21 1115  BP: (!) 179/89 (!) 188/90  Pulse: 82 80  Resp: 13 15  Temp:  (!) 36.2 C  SpO2: 95% 96%    Last Pain:  Vitals:   11/06/21 1115  TempSrc:   PainSc: 0-No pain                 [name]

## 2021-11-06 NOTE — Transfer of Care (Signed)
Immediate Anesthesia Transfer of Care Note  Patient: Brandy Galloway  Procedure(s) Performed: MRI WITH ANESTHESIA OF BRAIN WITH AND WITHOUT CONTRAST, MR ANGIOGRAM OF HEAD WITHOUT CONTRAST  Patient Location: PACU  Anesthesia Type:General  Level of Consciousness: drowsy, patient cooperative and responds to stimulation  Airway & Oxygen Therapy: Patient Spontanous Breathing  Post-op Assessment: Report given to RN and Post -op Vital signs reviewed and stable  Post vital signs: Reviewed and stable  Last Vitals:  Vitals Value Taken Time  BP 188/83 11/06/21 1045  Temp 36.2 C 11/06/21 1045  Pulse 95 11/06/21 1049  Resp 16 11/06/21 1049  SpO2 94 % 11/06/21 1049  Vitals shown include unvalidated device data.  Last Pain:  Vitals:   11/06/21 1045  TempSrc:   PainSc: 0-No pain         Complications: No notable events documented.

## 2021-11-06 NOTE — Anesthesia Procedure Notes (Addendum)
Procedure Name: Intubation Date/Time: 11/06/2021 8:47 AM  Performed by: Merryl Hacker, RNPre-anesthesia Checklist: Patient identified, Emergency Drugs available, Suction available and Patient being monitored Patient Re-evaluated:Patient Re-evaluated prior to induction Oxygen Delivery Method: Circle System Utilized Preoxygenation: Pre-oxygenation with 100% oxygen Induction Type: IV induction Ventilation: Mask ventilation without difficulty Laryngoscope Size: Mac and 3 Grade View: Grade I Tube type: Oral Tube size: 7.0 mm Number of attempts: 3 Airway Equipment and Method: Stylet Placement Confirmation: ETT inserted through vocal cords under direct vision, positive ETCO2 and breath sounds checked- equal and bilateral Secured at: 22 cm Tube secured with: Tape Dental Injury: Teeth and Oropharynx as per pre-operative assessment  Comments: 1st attempt: LMA 4 unable to seat 2nd attempt: LMA 5 unable to seat 3rd attempt: gentle DL with MAC 3, grade 1 view, 7.0 ETT passed through the cords, +chest rise and fall, +BBS, +EtCO2. Airway by Heide Scales, SRNA

## 2021-11-06 NOTE — Anesthesia Preprocedure Evaluation (Addendum)
Anesthesia Evaluation  Patient identified by MRN, date of birth, ID band Patient awake    Reviewed: Allergy & Precautions, NPO status , Patient's Chart, lab work & pertinent test results, reviewed documented beta blocker date and time   History of Anesthesia Complications Negative for: history of anesthetic complications  Airway Mallampati: III  TM Distance: >3 FB Neck ROM: Full    Dental  (+) Edentulous Upper, Edentulous Lower   Pulmonary neg pulmonary ROS,    Pulmonary exam normal breath sounds clear to auscultation       Cardiovascular hypertension, Pt. on home beta blockers and Pt. on medications Normal cardiovascular exam Rhythm:Regular Rate:Normal  Poorly controlled HTN, 190s-200s SBP in preop. Per pt caregiver, has been giving her scheduled metoprolol and hydralazine but not the HCTZ   Neuro/Psych Seizures - (last >20 years ago), Well Controlled,  PSYCHIATRIC DISORDERS Anxiety Trigeminal neuralgia, cluster headaches, intellectual disability    GI/Hepatic negative GI ROS, Neg liver ROS,   Endo/Other  negative endocrine ROS  Renal/GU negative Renal ROS  negative genitourinary   Musculoskeletal negative musculoskeletal ROS (+)   Abdominal   Peds  Hematology negative hematology ROS (+)   Anesthesia Other Findings Day of surgery medications reviewed with patient.  Reproductive/Obstetrics negative OB ROS                           Anesthesia Physical Anesthesia Plan  ASA: 3  Anesthesia Plan: General   Post-op Pain Management: Minimal or no pain anticipated   Induction: Intravenous  PONV Risk Score and Plan: Treatment may vary due to age or medical condition, Ondansetron and Midazolam  Airway Management Planned: LMA and Oral ETT  Additional Equipment: None  Intra-op Plan:   Post-operative Plan: Extubation in OR  Informed Consent: I have reviewed the patients History and  Physical, chart, labs and discussed the procedure including the risks, benefits and alternatives for the proposed anesthesia with the patient or authorized representative who has indicated his/her understanding and acceptance.     Dental advisory given  Plan Discussed with: CRNA  Anesthesia Plan Comments: (Long discussion with caregiver about making PCP appt ASAP to discuss blood pressure issues)     Anesthesia Quick Evaluation

## 2021-11-07 ENCOUNTER — Encounter (HOSPITAL_COMMUNITY): Payer: Self-pay | Admitting: Radiology

## 2021-11-07 ENCOUNTER — Telehealth: Payer: Self-pay

## 2021-11-07 DIAGNOSIS — G44009 Cluster headache syndrome, unspecified, not intractable: Secondary | ICD-10-CM | POA: Diagnosis not present

## 2021-11-07 DIAGNOSIS — I672 Cerebral atherosclerosis: Secondary | ICD-10-CM | POA: Diagnosis not present

## 2021-11-07 DIAGNOSIS — I651 Occlusion and stenosis of basilar artery: Secondary | ICD-10-CM | POA: Diagnosis not present

## 2021-11-07 MED ORDER — GADOBUTROL 1 MMOL/ML IV SOLN
7.0000 mL | Freq: Once | INTRAVENOUS | Status: AC | PRN
  Administered 2021-11-06: 7 mL via INTRAVENOUS

## 2021-11-07 NOTE — Telephone Encounter (Signed)
Contacted pt uncle again, no VM available.

## 2021-11-07 NOTE — Telephone Encounter (Signed)
Contacted pt uncle, no answer

## 2021-11-07 NOTE — Telephone Encounter (Signed)
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

## 2021-11-08 ENCOUNTER — Encounter: Payer: Self-pay | Admitting: Psychiatry

## 2021-11-09 DIAGNOSIS — G500 Trigeminal neuralgia: Secondary | ICD-10-CM | POA: Diagnosis not present

## 2021-11-09 DIAGNOSIS — I10 Essential (primary) hypertension: Secondary | ICD-10-CM | POA: Diagnosis not present

## 2021-11-12 ENCOUNTER — Other Ambulatory Visit: Payer: Self-pay | Admitting: Psychiatry

## 2021-11-12 DIAGNOSIS — R6884 Jaw pain: Secondary | ICD-10-CM

## 2021-11-12 NOTE — Telephone Encounter (Signed)
Patient's uncle called for results. I reviewed all three result notes with him. He would like her referred to another neurologist because she still complains of pain in her jaw. I advised will let Dr Billey Gosling know. He prefers to stay in [place]. He verbalized understanding, appreciation.

## 2021-11-14 ENCOUNTER — Encounter: Payer: Self-pay | Admitting: Neurology

## 2021-11-14 NOTE — Telephone Encounter (Signed)
Pt uncle is requesting Dr.Chima give him a call, about Pt MRI.

## 2021-11-29 DIAGNOSIS — H5213 Myopia, bilateral: Secondary | ICD-10-CM | POA: Diagnosis not present

## 2021-12-07 DIAGNOSIS — G500 Trigeminal neuralgia: Secondary | ICD-10-CM | POA: Diagnosis not present

## 2021-12-07 DIAGNOSIS — E785 Hyperlipidemia, unspecified: Secondary | ICD-10-CM | POA: Diagnosis not present

## 2021-12-07 DIAGNOSIS — I10 Essential (primary) hypertension: Secondary | ICD-10-CM | POA: Diagnosis not present

## 2021-12-07 IMAGING — CR DG LUMBAR SPINE 2-3V
2 series · 2 of 2 positions shown · non-contrast
Comparison: MRI of the lumbar spine 01/18/2019

CLINICAL DATA: Lumbar laminectomy.

EXAM:
LUMBAR SPINE - 2-3 VIEW

[AP (1 of 2)]
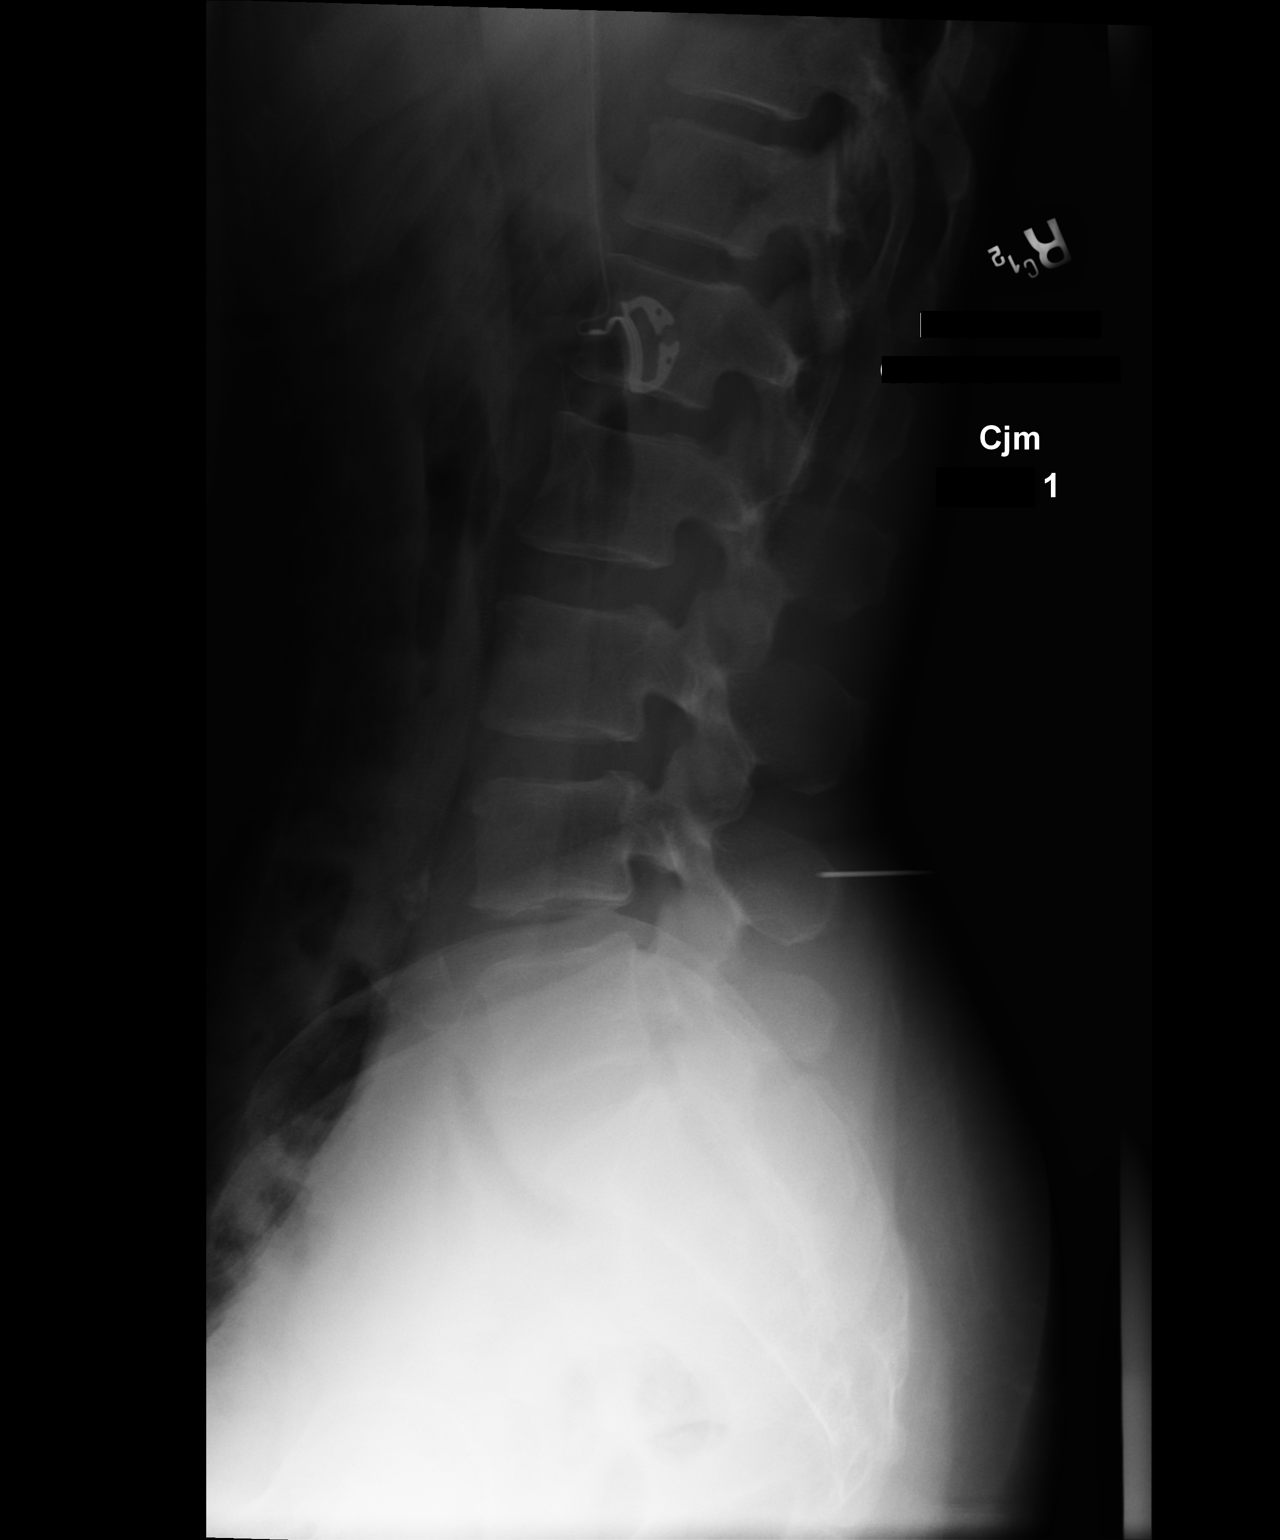

[AP (2 of 2)]
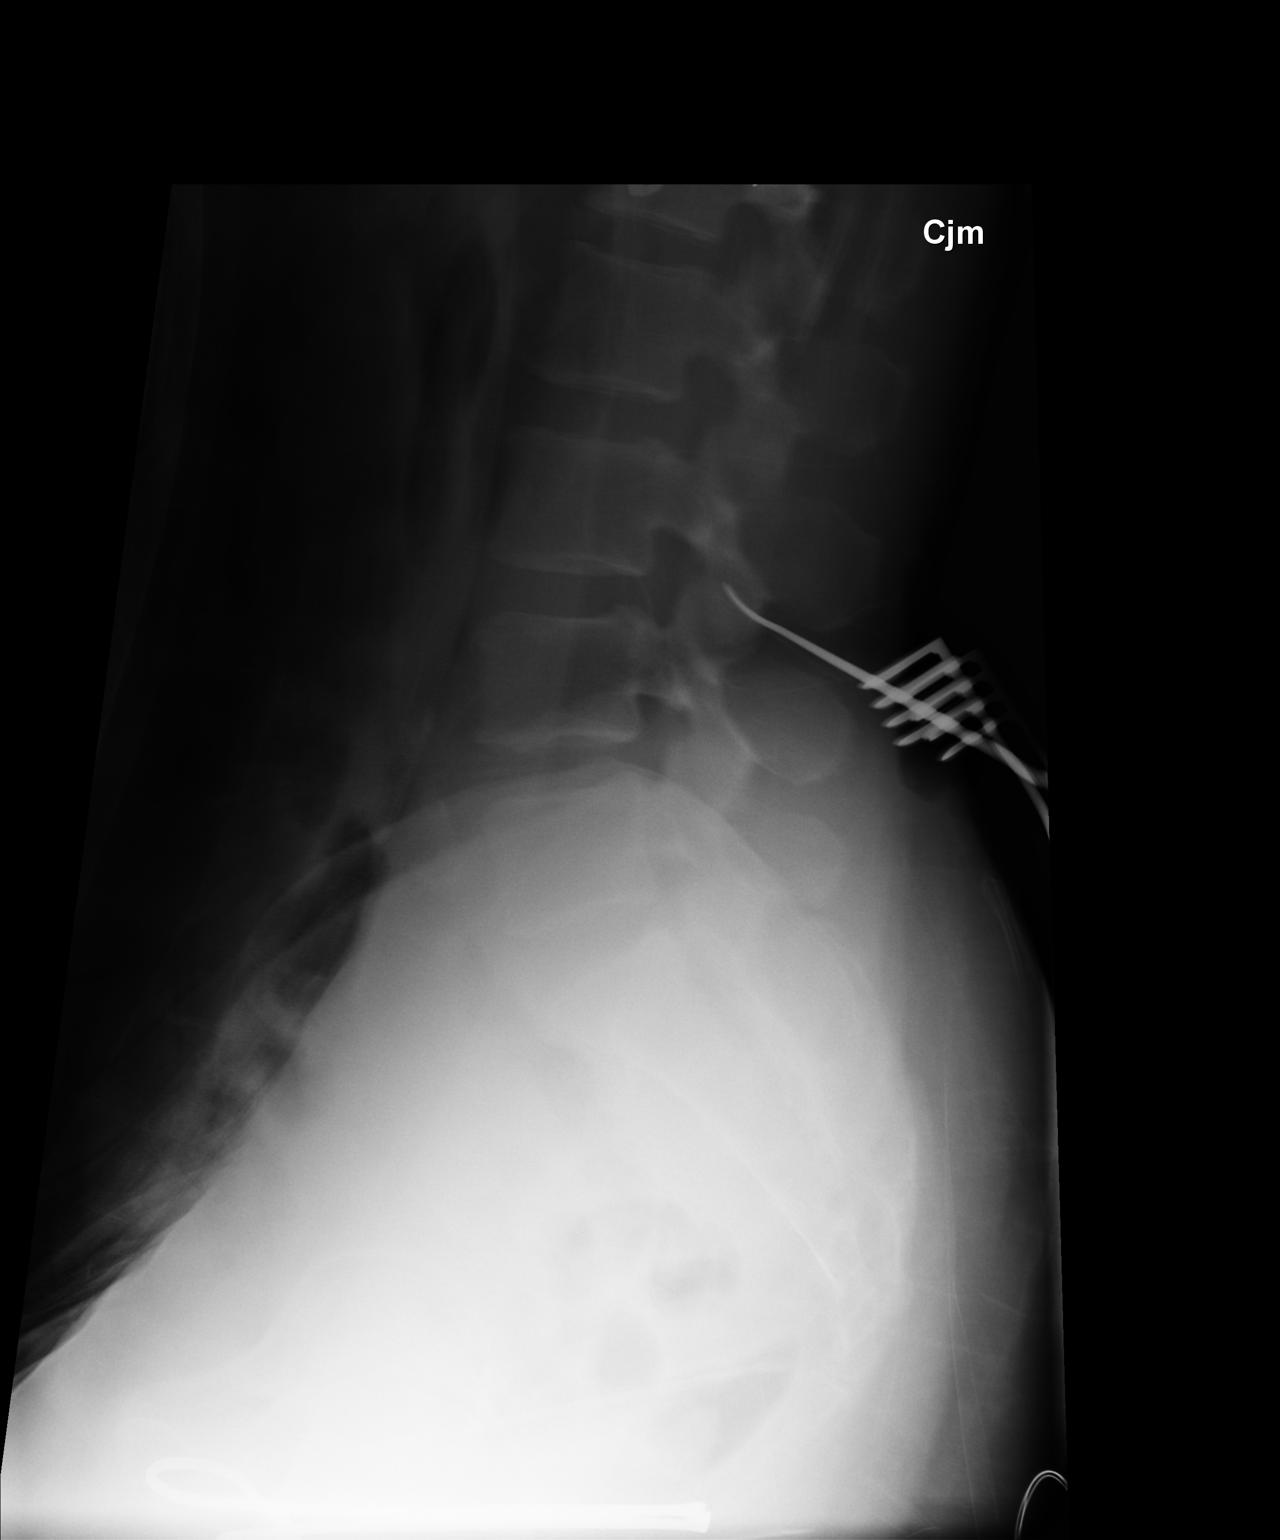

[2 of 2 positions shown; findings below may reference images not displayed]

FINDINGS: Two intraoperative images are submitted. A needle projects over the
L4 spinous process on image 1. A surgical probe is directed at the
L3-4 interspace on the second image.
IMPRESSION: Intraoperative localization of L3-4.

## 2021-12-10 ENCOUNTER — Encounter: Payer: Self-pay | Admitting: Neurology

## 2021-12-10 ENCOUNTER — Ambulatory Visit (INDEPENDENT_AMBULATORY_CARE_PROVIDER_SITE_OTHER): Payer: Medicaid Other | Admitting: Neurology

## 2021-12-10 VITALS — BP 171/83 | HR 70 | Ht 67.5 in | Wt 166.0 lb

## 2021-12-10 DIAGNOSIS — G501 Atypical facial pain: Secondary | ICD-10-CM | POA: Diagnosis not present

## 2021-12-10 MED ORDER — DULOXETINE HCL 30 MG PO CPEP: 30.0000 mg | ORAL_CAPSULE | Freq: Every day | ORAL | 2 refills | Status: DC

## 2021-12-10 NOTE — Progress Notes (Signed)
[facility] Clinic Note - Initial Visit   Date: 12/10/2021   Brandy Galloway MRN: [id number] DOB: [birth year]   Dear Dr. Billey Gosling:  Thank you for your kind referral of Brandy Galloway for consultation of left jaw pain. Although her history is well known to you, please allow [place] to reiterate it for the purpose of our medical record. The patient was accompanied to the clinic by legal guardian, Charlotte Crumb, who also provides collateral information.     Brandy Galloway is a 56 y.o. right-handed female with hypertension, developmental delay, seizure disorder presenting for evaluation of left jaw pain.   IMPRESSION/PLAN: Left atypical facial pain, not classic for trigeminal neuralgia.  Her history is not typical for trigeminal neuralgia and although imaging shows close proximity of the SCA, findings are bilaterally and does not appear to compression her trigeminal nerve.  Further, she has not responded to neuralgesic medications such as oxcarbamazepine, Lyrica, or gabapentin.  She has the most relief with motrin, which suggest musculoskeletal/soft tissue involvement.   At this juncture, the primary goal is pain relief, so I will refer her to pain management.  I will offer a trial of duloxetine '30mg'$  daily in the meantime.   ------------------------------------------------------------- History of present illness: History is primarily obtained from [place], legal guardian and maternal uncle, due to her developmental delay and difficulty with communicating.  She had left dental surgery earlier this year and a few days later, she began having sharp localized pain over the left jaw.  Pain is achy, throbbing, and sharp.  She denies electrical/radiating pain.   She was previously evaluated at [facility] for left facial pain.  Notes indicate that etiology of facial pain was unclear as history was not very clear, but she was offered trial of oxcarbazepine and baclofen for possible trigeminal  neuralgia.  Oxcarbamazepine was titrated to '300mg'$  TID and due to refractory pain, gabapentin '300mg'$  TID was added.  Gabapentin was stopped due to GI side effects.  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  She has the most relief with motrin. Her PCP started her on meloxicam '15mg'$  daily.  She is currently not taking Lyrica, oxcarbmazepine, or gabapentin.    Out-side paper records, electronic medical record, and images have been reviewed where available and summarized as:  MRI brain and face wwo contrast 11/06/2021: Brain MRI:   1. Sequela of perinatal cerebral insult as described. 2. 2 mm nodule along the planum sphenoidale, usually meningioma.   Face MRI:   1. No mass or inflammation to explain trigeminal nerve pain. 2. Symmetric close proximity of the superior cerebellar arteries to the trigeminal nerve root entry zones.  MRA head 11/06/2021: 1. No flow in the distal left V4 segment. 2. 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  No results found for: "HGBA1C" No results found for: "VITAMINB12" Lab Results  Component Value Date   TSH 1.307 08/09/2011   No results found for: "ESRSEDRATE", "POCTSEDRATE"  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/2011   Mental retardation    [place]cephaly ([place]    [place]iz[place]    last one more than  20 yrs ago   Seizures ([place])    years ago; more than 20 years ago    Past Surgical History:  Procedure Laterality Date   CERVICAL CONE BIOPSY  04/23/2007   CERVICAL CONIZATION W/BX  08/20/2011   Procedure: CONIZATION CERVIX WITH BIOPSY;  Surgeon:  Emily Filbert, MD;  Location: [facility];  Service: Gynecology;  Laterality: N/A;  With Endocervical [place]   COLPOSCOPY  06/21/2011   COLPOSCOPY N/A 05/28/2016   Procedure: COLPOSCOPY;  Surgeon: Mora Bellman, MD;  Location: [facility];  Service: Gynecology;  Laterality: N/A;   DILATION AND CURETTAGE OF UTERUS N/A 05/28/2016   Procedure: DILATATION AND CURETTAGE;  Surgeon: Mora Bellman, MD;  Location: [facility];  Service: Gynecology;  Laterality: N/A;   LEEP  04/23/2007   LUMBAR LAMINECTOMY/DECOMPRESSION MICRODISCECTOMY N/A 03/31/2019   Procedure: LAMINECTOMY LUMBAR THREE- LUMBAR FOUR, LUMBAR FOUR- LUMBAR FIVE;  Surgeon: Ashok Pall, MD;  Location: [facility];  Service: Neurosurgery;  Laterality: N/A;  LAMINECTOMY LUMBAR THREE- LUMBAR FOUR, LUMBAR FOUR- LUMBAR FIVE   MOUTH SURGERY Left 06/2021   Jaw surgery   RADIOLOGY WITH ANESTHESIA N/A 10/17/2020   Procedure: MRI WITH ANESTHESIA LUMBAR SPINE WITH AND WITHOUT CONTRAST;  Surgeon: Radiologist, Medication, MD;  Location: [facility];  Service: Radiology;  Laterality: N/A;   RADIOLOGY WITH ANESTHESIA N/A 11/06/2021   Procedure: MRI WITH ANESTHESIA OF BRAIN WITH AND WITHOUT CONTRAST, MR ANGIOGRAM OF HEAD WITHOUT CONTRAST;  Surgeon: Radiologist, Medication, MD;  Location: [facility];  Service: Radiology;  Laterality: N/A;   TOOTH EXTRACTION N/A 06/29/2021   Procedure: REMOVAL BILATERAL MANDIBULAR LINGUAL TORI;  Surgeon: Diona Browner, DMD;  Location: [facility];  Service: Oral Surgery;  Laterality: N/A;     Medications:  Outpatient Encounter Medications as of 12/10/2021  Medication Sig Note   DULoxetine (CYMBALTA) 30 MG capsule Take 1 capsule (30 mg total) by mouth daily.    hydrALAZINE (APRESOLINE) 25 MG tablet Take 25 mg by mouth 3 (three) times daily.    hydrochlorothiazide (HYDRODIURIL) 12.5 MG tablet Take 12.5 mg by mouth every morning. 11/06/2021: Not taking per patient's Uncle- legal guardian   ibuprofen (ADVIL) 200 MG tablet Take 200 mg by mouth every 6 (six)  hours as needed.    meloxicam (MOBIC) 15 MG tablet Take 15 mg by mouth daily.    metoprolol tartrate (LOPRESSOR) 25 MG tablet Take 25 mg by mouth 2 (two) times daily.    Oxcarbazepine (TRILEPTAL) 300 MG tablet Take 300-600 mg by mouth See admin instructions. Take 2 tablets (600 mg) by mouth in the morning, take 1 tablet (300 mg) by mouth at lunch & take 2 tablets (600 mg) by mouth at night.    pregabalin (LYRICA) 200 MG capsule Take 1 capsule (200 mg total) by mouth 3 (three) times daily.    acetaminophen (TYLENOL) 500 MG tablet Take 500 mg by mouth 2 (two) times daily as needed for headache (pain). (Patient not taking: Reported on 12/10/2021)    diphenhydramine-acetaminophen (TYLENOL PM) 25-500 MG TABS tablet Take 1 tablet by mouth at bedtime as needed (pain/sleep). (Patient not taking: Reported on 12/10/2021)    docusate sodium (COLACE) 100 MG capsule Take 100-200 mg by mouth 2 (two) times daily as needed for mild constipation. (Patient not taking: Reported on 12/10/2021)    oxyCODONE-acetaminophen (PERCOCET) 5-325 MG tablet Take 1 tablet by mouth every 4 (four) hours as needed. (Patient not taking: Reported on 11/01/2021)    polyethylene glycol (MIRALAX / GLYCOLAX) packet Take 17 g by  mouth daily as needed (constipation). (Patient not taking: Reported on 12/10/2021)    triamcinolone cream (KENALOG) 0.5 % Apply 1 Application topically 2 (two) times daily as needed (itching.). (Patient not taking: Reported on 12/10/2021)    No facility-administered encounter medications on file as of 12/10/2021.    Allergies: No Known Allergies  Family History: Family History  Problem Relation Age of Onset   Diabetes Mother    Diabetes Father     Social History: Social History   Tobacco Use   Smoking status: Never   Smokeless tobacco: Never  Vaping Use   Vaping Use: Never used  Substance Use Topics   Alcohol use: Never   Drug use: Never   Social History   Social History Narrative   08/01/21 Lives with  her uncle who is her legal guardian, [name].         Left Handed        Vital Signs:  BP (!) 171/83   Pulse 70   Ht 5' 7.5" (1.715 m)   Wt 166 lb (75.3 kg)   LMP  (LMP Unknown)   SpO2 100%   BMI 25.62 kg/m    Neurological Exam: MENTAL STATUS:  patient is awake and alert, she is able to say a few words and answer questions when given opens.  She can follow simple commands.  Speech is moderately dysarthric.  Spontaneous speech is poor.  CRANIAL NERVES: II:  No visual field defects. III-IV-VI: Pupils equal round and reactive to light.  Esotropia bilaterally, normal conjugate, extra-ocular eye movements in all directions of gaze.  No nystagmus.  No ptosis.   V:  Normal facial sensation.    VII:  Normal facial symmetry and movements.   VIII:  Normal hearing and vestibular function.   IX-X:  Normal palatal movement.   XI:  Normal shoulder shrug and head rotation.   XII:  Normal tongue strength and range of motion, no deviation or fasciculation.  MOTOR:  Motor strength is 5/5 throughout. No atrophy, fasciculations or abnormal movements.  No pronator drift.   MSRs:  Reflexes are 3+/4 brisk and symmetric throughout  SENSORY:  Normal and symmetric perception of light touch, pinprick, vibration, and proprioception.    COORDINATION/GAIT: Mild dysmetria with finger-to- nose-finger.  Gait is mildly wide-based, stooped, stable, unassisted   Thank you for allowing me to participate in patient's care.  If I can answer any additional questions, I would be pleased to do so.    Sincerely,    Treylin Burtch K. Posey Pronto, DO

## 2021-12-10 NOTE — Patient Instructions (Signed)
Start Cymbalta '30mg'$  daily   We will refer you to pain management

## 2021-12-17 ENCOUNTER — Ambulatory Visit: Payer: Self-pay

## 2021-12-17 NOTE — Telephone Encounter (Signed)
  Chief Complaint: oral pain Symptoms: L sided mouth pain, having to eat soft or pureed foods.  Frequency: ongoing 6 months Pertinent Negatives: NA Disposition: '[]'$ ED /'[]'$ Urgent Care (no appt availability in office) / '[]'$ Appointment(In office/virtual)/ '[]'$  [place] [facility]/ '[]'$ Home Care/ '[]'$ Refused Recommended Disposition /'[x]'$ [place] [facility]/ '[]'$  Follow-up with PCP Additional Notes: spoke with [name], pts guardian. She didn't have this problem until seen Dr. Hoyt Koch in March 2023. Pt has been seen by Neuro and given several different medications but nothing really helps pain except Motrin and taking it pretty frequent. Advised him pt is been referred to pain management and should be reaching out to schedule appt and offered pt can go to Mobile unit tomorrow and see Cari to see if anything else can be offered for pain. Pt has NPA scheduled on 01/29/22. He has been in touch with a law firm as well but wants to get help for pain to get relief from pain. Encouraged him to go to Mobile unit and schedule appt with pain management when they call as well. He verbalized understanding.   Summary: oral discomfort   The patient's uncle has called to share that the patient is continuing to experience oral discomfort   The patient was previously seen for oral surgery but continues to experience the discomfort post surgery roughly 6 months ago   Please contact the patient's uncle further when possible      Reason for Disposition  [1] MILD-MODERATE mouth pain AND [2] present > 3 days  Answer Assessment - Initial Assessment Questions 1. ONSET: "When did the mouth start hurting?" (e.g., hours or days ago)      Ongoing for 6 months  2. SEVERITY: "How bad is the pain?" (Scale 1-10; mild, moderate or severe)   - MILD (1-3):  doesn't interfere with eating or normal activities   - MODERATE (4-7): interferes with eating some solids and normal activities   - SEVERE (8-10):  excruciating pain, interferes with  most normal activities   - SEVERE DYSPHAGIA: can't swallow liquids, drooling     Moderate  3. SORES: "Are there any sores or ulcers in the mouth?" If Yes, ask: "What part of the mouth are the sores in?"     no 4. FEVER: "Do you have a fever?" If Yes, ask: "What is your temperature, how was it measured, and when did it start?"     No but gets warm at time  5. CAUSE: "What do you think is causing the mouth pain?"     Post oral surgery 6 months ago  6. OTHER SYMPTOMS: "Do you have any other symptoms?" (e.g., difficulty breathing)     L sided mouth pain  Protocols used: Mouth Pain-A-AH

## 2021-12-19 ENCOUNTER — Ambulatory Visit: Payer: Self-pay

## 2021-12-19 NOTE — Telephone Encounter (Signed)
  Chief Complaint: left jaw pain Symptoms: moderate to severe pain Frequency: onset since surgery 6 months ago Pertinent Negatives: Patient denies no sores or ulcers, fever, difficulty breathing Disposition: '[]'$ ED /'[x]'$ Urgent Care (no appt availability in office) / '[]'$ Appointment(In office/virtual)/ '[]'$  [place] [facility]/ '[]'$ Home Care/ '[]'$ Refused Recommended Disposition /'[]'$ [place] [facility]/ '[]'$  Follow-up with PCP Additional Notes: Unsure will take pt to UC. Or ED- Currently has NP appt in October- call transferred to [facility]. Pt's guardian Charlotte Crumb given number. Advised to call back if appt is made so appt at [facility] can be canceled. Reason for Disposition  [1] SEVERE mouth pain (e.g., excruciating) AND [2] not improved after 2 hours of pain medicine  Answer Assessment - Initial Assessment Questions 1. ONSET: "When did the mouth start hurting?" (e.g., hours or days ago)      Since surgery left side of jaw/ 6 months ago  2. SEVERITY: "How bad is the pain?" (Scale 1-10; mild, moderate or severe)   - MILD (1-3):  doesn't interfere with eating or normal activities   - MODERATE (4-7): interferes with eating some solids and normal activities   - SEVERE (8-10):  excruciating pain, interferes with most normal activities   - SEVERE DYSPHAGIA: can't swallow liquids, drooling     Moderate and severe comes and goes 3. SORES: "Are there any sores or ulcers in the mouth?" If Yes, ask: "What part of the mouth are the sores in?"     no 4. FEVER: "Do you have a fever?" If Yes, ask: "What is your temperature, how was it measured, and when did it start?"     no 5. CAUSE: "What do you think is causing the mouth pain?"     Related to surgery 6. OTHER SYMPTOMS: "Do you have any other symptoms?" (e.g., difficulty breathing)     no  Protocols used: Mouth Pain-A-AH

## 2021-12-20 ENCOUNTER — Other Ambulatory Visit: Payer: Self-pay

## 2021-12-20 ENCOUNTER — Emergency Department (HOSPITAL_COMMUNITY)
Admission: EM | Admit: 2021-12-20 | Discharge: 2021-12-20 | Discharge status: Against Medical Advice | Payer: Medicaid Other | Attending: Emergency Medicine | Admitting: Emergency Medicine

## 2021-12-20 ENCOUNTER — Encounter (HOSPITAL_COMMUNITY): Payer: Self-pay | Admitting: Emergency Medicine

## 2021-12-20 DIAGNOSIS — Z5321 Procedure and treatment not carried out due to patient leaving prior to being seen by health care provider: Secondary | ICD-10-CM | POA: Insufficient documentation

## 2021-12-20 DIAGNOSIS — K1379 Other lesions of oral mucosa: Secondary | ICD-10-CM | POA: Insufficient documentation

## 2021-12-20 NOTE — ED Triage Notes (Signed)
Pt c/o left sided mouth pain that has been ongoing since having oral surgery x 6 months ago.

## 2021-12-20 NOTE — ED Notes (Signed)
Pt guardian took her home. Pt guardian said if anything gets worse she will back.

## 2021-12-25 ENCOUNTER — Telehealth: Payer: Self-pay | Admitting: Neurology

## 2021-12-25 NOTE — Telephone Encounter (Signed)
The following message was left with [employer] on 12/23/21 at 1:57 PM.  Caller states she is an Mining engineer and a pt was confused regarding which office to call. The patient is having c/o right side face pain.  See detailed print out in Dr. Serita Grit inbox.

## 2021-12-26 NOTE — Telephone Encounter (Signed)
Called and spoke to patients guardian Charlotte Crumb and asked if patient has seen pain management and  if she has been taking 30 mg Cymbalta? Patients guardian stated yes and she is still in pain and patient does have an appt with Pain management tomorrow. I informed [name] to please ensure she goes to that appt because Dr. Posey Pronto does not treat Pain.   I did inform patients guardian that I will send this message to Dr. Posey Pronto to see if she had any other recommendations.

## 2021-12-26 NOTE — Progress Notes (Unsigned)
CC: Establish Care and Mouth Pain  HPI:  Brandy Galloway is a 56 y.o. female with a past medical history of HTN, ID, Seizures, Anxiety, DDD s/p laminectomy and lumbar fusion and presenting to the clinic today for establishment of care and for face pain. Please see problem based assessment and plan for additional details.  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/2011   Mental retardation    Microcephaly ([place])    Seizures ([place])    last one more than 20 yrs ago   Seizures ([place])    years ago; more than 20 years ago      Current Outpatient Medications (Cardiovascular):    hydrALAZINE (APRESOLINE) 25 MG tablet, Take 25 mg by mouth 3 (three) times daily.   hydrochlorothiazide (HYDRODIURIL) 12.5 MG tablet, Take 12.5 mg by mouth every morning.   metoprolol tartrate (LOPRESSOR) 25 MG tablet, Take 25 mg by mouth 2 (two) times daily.   Current Outpatient Medications (Analgesics):    acetaminophen (TYLENOL) 500 MG tablet, Take 500 mg by mouth 2 (two) times daily as needed for headache (pain). (Patient not taking: Reported on 12/10/2021)   ibuprofen (ADVIL) 200 MG tablet, Take 200 mg by mouth every 6 (six) hours as needed.   meloxicam (MOBIC) 15 MG tablet, Take 15 mg by mouth daily.   Current Outpatient Medications (Other):    DULoxetine (CYMBALTA) 30 MG capsule, Take 1 capsule (30 mg total) by mouth daily.   triamcinolone cream (KENALOG) 0.5 %, Apply 1 Application topically 2 (two) times daily as needed (itching.). (Patient not taking: Reported on 12/10/2021)  Allergies: NKDA  Family History  Problem Relation Age of Onset   Diabetes Mother    Diabetes Father   HTN: Mom   Social History: Lives with uncle. Can do ADL but needs help with iADLs. No smoking, no drinking or illicit substance use.    Vitals:   12/27/21 0901 12/27/21 0912  BP: (!) 148/74 (!)  149/68  Pulse: 77 70  Resp: (!) 32   Temp: 98.7 F (37.1 C)   TempSrc: Oral   SpO2: 100%   Weight: 168 lb 8 oz (76.4 kg)   Height: 5' 7.5" (1.715 m)      Physical Exam: General: Well appearing, NAD HENT: normocephalic, atraumatic, left submandibular swelling. TTP on left submandibular area.  EYES: conjunctiva non-erythematous, no scleral icterus CV: regular rate, normal rhythm, no murmurs, rubs, gallops. Pulmonary: normal work of breathing on RA, lungs clear to auscultation, no rales, wheezes, rhonchi Abdominal: non-distended, soft, non-tender to palpation, normal BS Skin: Warm and dry, no rashes or lesions Neurological: MS: awake, alert and oriented x3, normal speech and fund of knowledge Motor: moves all extremities antigravity Psych: normal affect  Assessment & Plan:   Jaw pain This pain appears secondary to her procedure. Neurology evaluated the patient and have found no cause for the pain. Patient has TTP and swelling of the left submandibular region consistent with reactive lymphadenopathy in setting of recent procedure. I advised continue using Motrin as that is helping and using warm compresses. I have placed a referral for oral surgery. Advised patient to follow up with her primary care doctor as she already has established PCP.  -Continue motrin as needed, avoid meloxicam if using mtorin -Continue using warm compresses -Referral placed to oral surgery.   AKI (acute kidney injury) ([place])  AKI noted on previous lab work. BMP repeated and shows normal renal function. Advised to avoid using multiple NSAIDs.   HTN (hypertension) Patient on Hydralazine, HCTZ and Lopressor for HTN. Some medications are not first line but unaware of the reason pt placed on these by her PCP. Patient was hypertensive which could be due to pain or NSAID use. Advised to continue taking them and follow up with her PCP.    See Encounters Tab for problem based charting.  Patient discussed with Dr.  Oren Binet, MD Tillie Rung. [facility] Internal Medicine Residency, PGY-2

## 2021-12-27 ENCOUNTER — Ambulatory Visit (INDEPENDENT_AMBULATORY_CARE_PROVIDER_SITE_OTHER): Payer: Medicaid Other | Admitting: Internal Medicine

## 2021-12-27 ENCOUNTER — Encounter: Payer: Self-pay | Admitting: Internal Medicine

## 2021-12-27 ENCOUNTER — Other Ambulatory Visit: Payer: Self-pay | Admitting: *Deleted

## 2021-12-27 VITALS — BP 149/68 | HR 70 | Temp 98.7°F | Resp 32 | Ht 67.5 in | Wt 168.5 lb

## 2021-12-27 DIAGNOSIS — I10 Essential (primary) hypertension: Secondary | ICD-10-CM | POA: Diagnosis not present

## 2021-12-27 DIAGNOSIS — N179 Acute kidney failure, unspecified: Secondary | ICD-10-CM | POA: Diagnosis not present

## 2021-12-27 DIAGNOSIS — R6884 Jaw pain: Secondary | ICD-10-CM

## 2021-12-27 NOTE — Patient Instructions (Addendum)
Brandy Galloway, it was a pleasure seeing you today! You endorsed feeling well today. Below are some of the things we talked about this visit. We look forward to seeing you in the follow up appointment!  Today we discussed: You presented with complaint of jaw pain. It appears the jaw pain started after the procedure you had done 6 months. You will need to follow with the oral surgery for this. There is some swelling under the jaw. For this I recommend you use warm compresses to massage the area. We will check some lab work to see how your kidneys are doing so you can continue taking motrin for the pain. Don't take meloxicam if you take motrin and don't take any other NSAIDs.   The number for the oral surgeon is [phone].  I have ordered the following labs today:   Lab Orders         BMP8+Anion Gap       Referrals ordered today:   Referral Orders  No referral(s) requested today     I have ordered the following medication/changed the following medications:   Stop the following medications: Medications Discontinued During This Encounter  Medication Reason   oxyCODONE-acetaminophen (PERCOCET) 5-325 MG tablet Patient has not taken in last 30 days   polyethylene glycol (MIRALAX / GLYCOLAX) packet Patient has not taken in last 30 days   docusate sodium (COLACE) 100 MG capsule Patient has not taken in last 30 days   diphenhydramine-acetaminophen (TYLENOL PM) 25-500 MG TABS tablet Patient has not taken in last 30 days   pregabalin (LYRICA) 200 MG capsule Patient has not taken in last 30 days   Oxcarbazepine (TRILEPTAL) 300 MG tablet Patient has not taken in last 30 days     Start the following medications: No orders of the defined types were placed in this encounter.    Follow-up: Follow up with PCP   Please make sure to arrive 15 minutes prior to your next appointment. If you arrive late, you may be asked to reschedule.   We look forward to seeing you next time. Please call  our clinic at [phone] if you have any questions or concerns. The best time to call is Monday-Friday from 9am-4pm, but there is someone available 24/7. If after hours or the weekend, call the main hospital number and ask for the Internal Medicine Resident On-Call. If you need medication refills, please notify your pharmacy one week in advance and they will send [place] a request.  Thank you for letting us take part in your care. Wishing you the best!  Thank you, Idamae Schuller, MD

## 2021-12-27 NOTE — Telephone Encounter (Signed)
Best to see pain management today and get their opinion.

## 2021-12-28 LAB — BMP8+ANION GAP
Anion Gap: 15 mmol/L (ref 10.0–18.0)
BUN/Creatinine Ratio: 20 (ref 9–23)
BUN: 19 mg/dL (ref 6–24)
CO2: 24 mmol/L (ref 20–29)
Calcium: 9.7 mg/dL (ref 8.7–10.2)
Chloride: 102 mmol/L (ref 96–106)
Creatinine, Ser: 0.96 mg/dL (ref 0.57–1.00)
Glucose: 78 mg/dL (ref 70–99)
Potassium: 4.1 mmol/L (ref 3.5–5.2)
Sodium: 141 mmol/L (ref 134–144)
eGFR: 69 mL/min/{1.73_m2} (ref >59)

## 2021-12-29 DIAGNOSIS — R6884 Jaw pain: Secondary | ICD-10-CM | POA: Insufficient documentation

## 2021-12-29 DIAGNOSIS — I10 Essential (primary) hypertension: Secondary | ICD-10-CM | POA: Insufficient documentation

## 2021-12-29 DIAGNOSIS — N179 Acute kidney failure, unspecified: Secondary | ICD-10-CM | POA: Insufficient documentation

## 2021-12-29 NOTE — Assessment & Plan Note (Signed)
Patient on Hydralazine, HCTZ and Lopressor for HTN. Some medications are not first line but unaware of the reason pt placed on these by her PCP. Patient was hypertensive which could be due to pain or NSAID use. Advised to continue taking them and follow up with her PCP.

## 2021-12-29 NOTE — Assessment & Plan Note (Signed)
AKI noted on previous lab work. BMP repeated and shows normal renal function. Advised to avoid using multiple NSAIDs.

## 2021-12-29 NOTE — Assessment & Plan Note (Signed)
This pain appears secondary to her procedure. Neurology evaluated the patient and have found no cause for the pain. Patient has TTP and swelling of the left submandibular region consistent with reactive lymphadenopathy in setting of recent procedure. I advised continue using Motrin as that is helping and using warm compresses. I have placed a referral for oral surgery. Advised patient to follow up with her primary care doctor as she already has established PCP.  -Continue motrin as needed, avoid meloxicam if using mtorin -Continue using warm compresses -Referral placed to oral surgery.

## 2021-12-31 ENCOUNTER — Encounter: Payer: Self-pay | Admitting: Internal Medicine

## 2021-12-31 NOTE — Progress Notes (Signed)
Internal Medicine Clinic Attending  Case discussed with Dr. Khan  At the time of the visit.  We reviewed the resident's history and exam and pertinent patient test results.  I agree with the assessment, diagnosis, and plan of care documented in the resident's note.  

## 2022-01-01 ENCOUNTER — Ambulatory Visit: Payer: Medicaid Other | Admitting: Psychiatry

## 2022-01-01 ENCOUNTER — Telehealth: Payer: Self-pay

## 2022-01-01 NOTE — Telephone Encounter (Signed)
Patients uncle called stating the patient is requesting pain medication for her jaw/mouth pain.

## 2022-01-02 NOTE — Telephone Encounter (Signed)
Patient needs to contact her pcp. If she wants to establish care here for pcp she can no longer be a patient with her original pcp.

## 2022-01-29 ENCOUNTER — Encounter: Payer: Self-pay | Admitting: Internal Medicine

## 2022-01-29 ENCOUNTER — Ambulatory Visit: Payer: Medicaid Other | Attending: Internal Medicine | Admitting: Internal Medicine

## 2022-01-29 ENCOUNTER — Ambulatory Visit: Payer: Medicaid Other | Admitting: Internal Medicine

## 2022-01-29 VITALS — BP 170/80 | HR 68 | Ht 68.0 in | Wt 174.6 lb

## 2022-01-29 DIAGNOSIS — Z23 Encounter for immunization: Secondary | ICD-10-CM | POA: Diagnosis not present

## 2022-01-29 DIAGNOSIS — Z7689 Persons encountering health services in other specified circumstances: Secondary | ICD-10-CM

## 2022-01-29 DIAGNOSIS — R519 Headache, unspecified: Secondary | ICD-10-CM | POA: Diagnosis not present

## 2022-01-29 DIAGNOSIS — G8929 Other chronic pain: Secondary | ICD-10-CM

## 2022-01-29 DIAGNOSIS — I10 Essential (primary) hypertension: Secondary | ICD-10-CM

## 2022-01-29 DIAGNOSIS — M79605 Pain in left leg: Secondary | ICD-10-CM

## 2022-01-29 MED ORDER — AMLODIPINE BESYLATE 5 MG PO TABS: 5.0000 mg | ORAL_TABLET | Freq: Every day | ORAL | 4 refills | Status: DC

## 2022-01-29 MED ORDER — DICLOFENAC SODIUM 1 % EX GEL: 2.0000 g | Freq: Two times a day (BID) | CUTANEOUS | 1 refills | Status: DC

## 2022-01-29 NOTE — Progress Notes (Unsigned)
Patient ID: Brandy Galloway, female    DOB: 11/08/1965  MRN: [id number]  CC: New Patient (Initial Visit) and Establish Care   Subjective: Brandy Galloway is a 56 y.o. female who presents for new pt visit. [name], pt's uncle by marriage and her guardian, is with her Her concerns today include:  Patient with history of HTN, abnormal Pap smear with history of LGSIL, developmental delay since birth , seizure (only one time many yrs ago; not on med), anxiety, lumbar stenosis s/p laminectomy (Dr. Cyndy Freeze), chronic left-sided facial pain  Previous PCP was Dr. Criss Rosales at [facility].  Decided to change because they were not sending in her meds on time for Rfs.  Her uncle gives most of the history.  Lives with her uncle and aunt since 22s when pt's mother past.  [name]'s wife died in 07-22-2014. He is her guardian. She is on disability since birth.  Never went to school. Uncle does the cooking and controls her meds. -pt baths herself, puts on her clothes.  Eats unassisted  HTN: Her uncle has a bag of medications with him.  Blood pressure medication bottles that are in the bag are: Hydralazine 25 mg TID, Metoprolol 25 mg BID and HCTZ 25 mg daily.  Charlotte Crumb does the cooking and tries to limit salt in her foods.  Patient has not complained of any chest pains, shortness of breath.  She does have swelling in the left lower leg which goes and comes and responds well to Voltaren gel.  He is requesting a refill on Voltaren gel for her for this reason.  Uncle request referral to new dentist for patient.  He tells me that patient has been dealing with left-sided facial pain/jaw pain since the spring. Pt had all teeth removed so that she can get dentures.   Then had 2 bones filed down by Dr. Diona Browner so that dentures can fit.  After procedure she has had chronic pain LT jaw.  She has not been able to wear dentures.   Seen by neurology for evaluation.  Dr. Posey Pronto did not think her pain was typical for trigeminal  neuralgia.  It was tried with several medications include oxcarbamazepine, Lyrica and gabapentin without much relief.  She referred the patient to [facility] and offered a trial of Cymbalta 30 mg in the meantime. -Seen at [facility] internal medicine residency clinic about a month ago with same issue.  Referred to oral maxillofacial surgery. -The uncle tells me today that she did not tolerate duloxetine.  She is taking Lyrica 50 mg 3 tabs in the morning, 1 tab in the afternoon and 3 at nights.  This has helped a little.  She is also taking meloxicam that was prescribed by her previous PCP Dr. Criss Rosales..     Patient Active Problem List   Diagnosis Date Noted   Jaw pain 12/29/2021   AKI (acute kidney injury) ([place]) 12/29/2021   HTN (hypertension) 12/29/2021   Lumbar back pain 11/22/2020   Spondylolisthesis of lumbar region 11/22/2020   Abnormal EKG 01/11/2020   Lumbar stenosis with neurogenic claudication 03/31/2019   LGSIL on Pap smear of cervix    Postmenopausal vaginal bleeding    Low grade squamous intraepith lesion on cytologic smear cervix (lgsil) 07/12/2011     Current Outpatient Medications on File Prior to Visit  Medication Sig Dispense Refill   acetaminophen (TYLENOL) 500 MG tablet Take 500 mg by mouth 2 (two) times daily as needed for headache (pain).  hydrALAZINE (APRESOLINE) 25 MG tablet Take 25 mg by mouth 3 (three) times daily.     meloxicam (MOBIC) 15 MG tablet Take 15 mg by mouth daily.     triamcinolone cream (KENALOG) 0.5 % Apply 1 Application topically 2 (two) times daily as needed (itching.).     hydrochlorothiazide (HYDRODIURIL) 25 MG tablet Take 25 mg by mouth every morning.     No current facility-administered medications on file prior to visit.    No Known Allergies  Social History   Socioeconomic History   Marital status: Single    Spouse name: Not on file   Number of children: 0   Years of education: Not on file   Highest education level: Not  on file  Occupational History   Not on file  Tobacco Use   Smoking status: Never   Smokeless tobacco: Never  Vaping Use   Vaping Use: Never used  Substance and Sexual Activity   Alcohol use: Never   Drug use: Never   Sexual activity: Never    Birth control/protection: None  Other Topics Concern   Not on file  Social History Narrative   08/01/21 Lives with her uncle who is her legal guardian, [name].         Left Handed       Social Determinants of Health   Financial Resource Strain: Not on file  Food Insecurity: Not on file  Transportation Needs: Not on file  Physical Activity: Not on file  Stress: Not on file  Social Connections: Not on file  Intimate Partner Violence: Not on file    Family History  Problem Relation Age of Onset   Diabetes Mother    Diabetes Father     Past Surgical History:  Procedure Laterality Date   CERVICAL CONE BIOPSY  04/23/2007   CERVICAL CONIZATION W/BX  08/20/2011   Procedure: CONIZATION CERVIX WITH BIOPSY;  Surgeon: Emily Filbert, MD;  Location: [facility];  Service: Gynecology;  Laterality: N/A;  With Endocervical Currettage   COLPOSCOPY  06/21/2011   COLPOSCOPY N/A 05/28/2016   Procedure: COLPOSCOPY;  Surgeon: Mora Bellman, MD;  Location: [facility];  Service: Gynecology;  Laterality: N/A;   DILATION AND CURETTAGE OF UTERUS N/A 05/28/2016   Procedure: DILATATION AND CURETTAGE;  Surgeon: Mora Bellman, MD;  Location: [facility];  Service: Gynecology;  Laterality: N/A;   LEEP  04/23/2007   LUMBAR LAMINECTOMY/DECOMPRESSION MICRODISCECTOMY N/A 03/31/2019   Procedure: LAMINECTOMY LUMBAR THREE- LUMBAR FOUR, LUMBAR FOUR- LUMBAR FIVE;  Surgeon: Ashok Pall, MD;  Location: [facility];  Service: Neurosurgery;  Laterality: N/A;  LAMINECTOMY LUMBAR THREE- LUMBAR FOUR, LUMBAR FOUR- LUMBAR FIVE   MOUTH SURGERY Left 06/2021   Jaw surgery   RADIOLOGY WITH ANESTHESIA N/A 10/17/2020   Procedure: MRI WITH ANESTHESIA LUMBAR SPINE WITH AND WITHOUT CONTRAST;   Surgeon: Radiologist, Medication, MD;  Location: [facility];  Service: Radiology;  Laterality: N/A;   RADIOLOGY WITH ANESTHESIA N/A 11/06/2021   Procedure: MRI WITH ANESTHESIA OF BRAIN WITH AND WITHOUT CONTRAST, MR ANGIOGRAM OF HEAD WITHOUT CONTRAST;  Surgeon: Radiologist, Medication, MD;  Location: [facility];  Service: Radiology;  Laterality: N/A;   TOOTH EXTRACTION N/A 06/29/2021   Procedure: REMOVAL BILATERAL MANDIBULAR LINGUAL TORI;  Surgeon: Diona Browner, DMD;  Location: [facility];  Service: Oral Surgery;  Laterality: N/A;    ROS: Review of Systems Negative except as stated above  PHYSICAL EXAM: BP (!) 170/80   Pulse 68   Ht '5\' 8"'$  (1.727 m)  Wt 174 lb 9.6 oz (79.2 kg)   LMP  (LMP Unknown)   SpO2 100%   BMI 26.55 kg/m   Wt Readings from Last 3 Encounters:  01/29/22 174 lb 9.6 oz (79.2 kg)  12/27/21 168 lb 8 oz (76.4 kg)  12/10/21 166 lb (75.3 kg)    Physical Exam  General appearance -middle-age to older African-American female in NAD.  Patient mumbles when she talks and is barely understood. Mental status -patient is not able to tell me the day, month or year. Mouth: Oral mucosa is moist.  She is edentulous. Neck - supple, no significant adenopathy Chest - clear to auscultation, no wheezes, rales or rhonchi, symmetric air entry Heart - normal rate, regular rhythm, normal S1, S2, no murmurs, rubs, clicks or gallops Extremities -left lower leg below the knee is a little larger than the right leg.  No pitting edema.  She has some venous stasis changes in the lower aspect of both legs.  MSK: Left knee mildly enlarged compared to the right.  No point tenderness.  Good range of motion.      Latest Ref Rng & Units 12/27/2021   10:30 AM 11/06/2021    7:24 AM 06/29/2021    8:30 AM  CMP  Glucose 70 - 99 mg/dL 78  91  101   BUN 6 - 24 mg/dL '19  21  16   '$ Creatinine 0.57 - 1.00 mg/dL 0.96  1.14  0.86   Sodium 134 - 144 mmol/L 141  142  139   Potassium 3.5 - 5.2 mmol/L 4.1  3.9  3.8    Chloride 96 - 106 mmol/L 102  108  105   CO2 20 - 29 mmol/L '24  24  26   '$ Calcium 8.7 - 10.2 mg/dL 9.7  9.5  9.5    Lipid Panel  No results found for: "CHOL", "TRIG", "HDL", "CHOLHDL", "VLDL", "LDLCALC", "LDLDIRECT"  CBC    Component Value Date/Time   WBC 7.3 11/06/2021 0724   RBC 3.78 (L) 11/06/2021 0724   HGB 11.6 (L) 11/06/2021 0724   HCT 34.8 (L) 11/06/2021 0724   PLT 282 11/06/2021 0724   MCV 92.1 11/06/2021 0724   MCH 30.7 11/06/2021 0724   MCHC 33.3 11/06/2021 0724   RDW 12.9 11/06/2021 0724    ASSESSMENT AND PLAN:  1. Establishing care with new doctor, encounter for Patient's guardian to sign a release for me to get her records from the [facility]  2. Essential hypertension Not at goal. Stop metoprolol.  Start Norvasc 5 mg instead.  Continue HCTZ 25 mg daily and hydralazine 25 mg 3 times a day. Follow-up with clinical pharmacist in 2 weeks for repeat blood pressure check. - amLODipine (NORVASC) 5 MG tablet; Take 1 tablet (5 mg total) by mouth daily.  Dispense: 30 tablet; Refill: 4  3. Chronic facial pain Advise patient's guardian that we do not do dental referrals for persons to have insurance.  I recommend that he calls her insurance and asked to be sent a list of dental providers in the area who take Medicaid.  Also I would like to see the outcome of referral that was already submitted to pain management and oral/maxillofacial surgery.  4. Pain of left lower extremity - diclofenac Sodium (VOLTAREN) 1 % GEL; Apply 2 g topically in the morning and at bedtime.  Dispense: 100 g; Refill: 1  5. Need for immunization against influenza - Flu Vaccine QUAD 55moIM (Fluarix, Fluzone & Alfiuria Quad PF)  Patient was given the opportunity to ask questions.  Patient verbalized understanding of the plan and was able to repeat key elements of the plan.   This documentation was completed using Radio producer.  Any transcriptional errors are  unintentional.  Orders Placed This Encounter  Procedures   Flu Vaccine QUAD 33moIM (Fluarix, Fluzone & Alfiuria Quad PF)     Requested Prescriptions   Signed Prescriptions Disp Refills   amLODipine (NORVASC) 5 MG tablet 30 tablet 4    Sig: Take 1 tablet (5 mg total) by mouth daily.   diclofenac Sodium (VOLTAREN) 1 % GEL 100 g 1    Sig: Apply 2 g topically in the morning and at bedtime.    Return in about 6 weeks (around 03/12/2022) [facility]  [facility]  DKarle Plumber MD, FACP

## 2022-01-29 NOTE — Progress Notes (Unsigned)
Pt guardian wants to discuss referral to dental surgeon or dental service.

## 2022-01-29 NOTE — Patient Instructions (Signed)
STOP Metoprolol.  Start Amlodipine instead.  Continue hydrochlorothiazide and hydralazine.  I will have you follow-up with our clinical pharmacist in 2 weeks for repeat blood pressure check.  You will need to call Medicaid and have them send you a list of dentists in the area that except the patient's insurance.  Once you get that list, you can call and schedule an appointment with one of the dentist.

## 2022-02-08 ENCOUNTER — Ambulatory Visit: Payer: Medicaid Other | Admitting: Neurology

## 2022-02-13 ENCOUNTER — Telehealth: Payer: Self-pay | Admitting: Internal Medicine

## 2022-02-13 NOTE — Telephone Encounter (Signed)
The uncle and caregiver called in requesting information regarding a prescription from another provider but after talking with him he noticed the patient had refills left on the bottle. He is going to call the pharmacy and check on it and if there is any issues he will call back.

## 2022-02-22 ENCOUNTER — Ambulatory Visit: Payer: Medicaid Other | Admitting: Neurology

## 2022-02-25 ENCOUNTER — Ambulatory Visit: Payer: Medicaid Other | Attending: Internal Medicine | Admitting: Pharmacist

## 2022-02-25 VITALS — BP 157/72

## 2022-02-25 DIAGNOSIS — I10 Essential (primary) hypertension: Secondary | ICD-10-CM | POA: Diagnosis not present

## 2022-02-25 MED ORDER — AMLODIPINE BESYLATE 10 MG PO TABS: 10.0000 mg | ORAL_TABLET | Freq: Every day | ORAL | 1 refills | Status: DC

## 2022-02-25 NOTE — Progress Notes (Signed)
   S:    No chief complaint on file.  56 y.o. female who presents for hypertension evaluation, education, and management.  PMH is significant for HTN, abnormal Pap smear with history of LGSIL, developmental delay since birth , seizure (only one time many yrs ago; not on med), anxiety, lumbar stenosis s/p laminectomy (Dr. Cyndy Freeze), chronic left-sided facial pain.  Patient was referred and last seen by Primary Care Provider, Dr. Wynetta Emery, on 01/29/2022.   At last visit, BP was 170/80 and amlodipine 5 mg was started and HCTZ was increased from 12.5 mg to 25 mg daily.   Today, patient arrives in good spirits and presents with the assistance of her uncle/guardian [name]. Denies dizziness, headache, blurred vision, swelling. Endorses tooth pain for 7 months and has appointment for evaluation in [place] on 03/26/2022.   Patient reports hypertension was diagnosed a long time ago.   Medication adherence reported. Patient has taken BP medications today.   Current antihypertensives include: amlodipine 5 mg daily, hydralazine 25 mg TID, HCTZ 25 mg daily   Reported home BP readings: not checking BP at home.   O:  Vitals:   02/25/22 1401 02/25/22 1411  BP: (!) 175/75 (!) 157/72   Last 3 Office BP readings: BP Readings from Last 3 Encounters:  02/25/22 (!) 157/72  01/29/22 (!) 170/80  12/27/21 (!) 149/68   BMET    Component Value Date/Time   NA 141 12/27/2021 1030   K 4.1 12/27/2021 1030   CL 102 12/27/2021 1030   CO2 24 12/27/2021 1030   GLUCOSE 78 12/27/2021 1030   GLUCOSE 91 11/06/2021 0724   BUN 19 12/27/2021 1030   CREATININE 0.96 12/27/2021 1030   CALCIUM 9.7 12/27/2021 1030   GFRNONAA 56 (L) 11/06/2021 0724   GFRAA >60 03/29/2019 1042    A/P: Hypertension currently uncontrolled on current medications. BP goal < 130/80 mmHg. Medication adherence appears appropriate. Initial office BP is relatively unchanged from last visit even with the additional of amlodipine. -Increased  dose of amlodipine to 10 mg daily. -Continue current doses of hydralazine and HCTZ -F/u labs ordered - aldosterone + renin activity. Given history of hypokalemia, may respond well to spironolactone.  -Counseled on lifestyle modifications for blood pressure control including reduced dietary sodium, increased exercise, adequate sleep. -Encouraged patient to check BP at home and bring log of readings to next visit. Counseled on proper use of home BP cuff.   Written patient instructions provided. Patient verbalized understanding of treatment plan.  Total time in face to face counseling 25 minutes.    Follow-up:  Pharmacist PRN. PCP clinic visit on 03/19/2022.   Joseph Art, Pharm.D. PGY-2 Ambulatory Care Pharmacy Resident 02/25/2022 2:39 PM

## 2022-03-02 LAB — ALDOSTERONE + RENIN ACTIVITY W/ RATIO
Aldos/Renin Ratio: <3.4 (ref 0.0–30.0)
Aldosterone: <1 ng/dL (ref 0.0–30.0)
Renin Activity, Plasma: 0.29 ng/mL/hr (ref 0.167–5.380)

## 2022-03-04 ENCOUNTER — Ambulatory Visit: Payer: Self-pay | Admitting: *Deleted

## 2022-03-04 NOTE — Telephone Encounter (Signed)
  Chief Complaint: left side jaw pain  Symptoms: left side jaw pain since having surgery for false teeth. Oral surgeon scrapped bone and patient's uncle on DPR reports patient has had severe pain since. Requesting pain medication until seen by another dentist at [facility]. Can tolerate eating soft foods only unable to get false teeth due to pain in jaw Frequency: 8 months Pertinent Negatives: Patient denies fever, no drainage in mouth  Disposition: '[]'$ ED /'[]'$ Urgent Care (no appt availability in office) / '[]'$ Appointment(In office/virtual)/ '[]'$  [facility]/ '[]'$ Home Care/ '[]'$ Refused Recommended Disposition /'[]'$  Mobile Bus/ '[x]'$  Follow-up with PCP Additional Notes:   Requesting pain medication. Patient 's uncle would like a call back,    Reason for Disposition  [1] After 72 hours AND [2] pain not improving  Answer Assessment - Initial Assessment Questions 1. MECHANISM: "How did the injury happen?"      Surgical procedure left side of jaw 8 months ago  2. ONSET: "When did the injury happen?" (Minutes or hours ago)      8 months ago surgery 06/29/21. Scraped bone  3. LOCATION: "What part of the mouth is injured?"      na 4. APPEARANCE of INJURY: "What does the mouth look like?"      Na 5. BLEEDING: "Is the mouth still bleeding?" If Yes, ask: "Is it difficult to stop?"      no 6. SIZE: For cuts, bruises, or swelling, ask: "How large is it?" (e.g., inches or centimeters; entire lip)      Na  7. PAIN: "Is it painful?" If Yes, ask: "How bad is the pain?"   (e.g., Scale 1-10; or mild, moderate, severe)     severe 8. TETANUS: For any breaks in the skin, ask: "When was the last tetanus booster?"     na 9. OTHER SYMPTOMS: "Are you having any trouble breathing?"     Can eat soft foods.  10. PREGNANCY: "Is there any chance you are pregnant?" "When was your last menstrual period?"       na  Protocols used: Mouth Injury-A-AH

## 2022-03-05 MED ORDER — ACETAMINOPHEN 500 MG PO TABS: 500.0000 mg | ORAL_TABLET | Freq: Four times a day (QID) | ORAL | 1 refills | Status: AC | PRN

## 2022-03-05 NOTE — Telephone Encounter (Signed)
Call placed to pt and VM is not set up to leave a message.

## 2022-03-19 ENCOUNTER — Encounter: Payer: Self-pay | Admitting: Internal Medicine

## 2022-03-19 ENCOUNTER — Ambulatory Visit: Payer: Medicaid Other | Attending: Internal Medicine | Admitting: Internal Medicine

## 2022-03-19 VITALS — BP 160/78 | HR 75 | Ht 68.0 in | Wt 180.0 lb

## 2022-03-19 DIAGNOSIS — Z1211 Encounter for screening for malignant neoplasm of colon: Secondary | ICD-10-CM | POA: Diagnosis not present

## 2022-03-19 DIAGNOSIS — E663 Overweight: Secondary | ICD-10-CM | POA: Diagnosis not present

## 2022-03-19 DIAGNOSIS — Z1231 Encounter for screening mammogram for malignant neoplasm of breast: Secondary | ICD-10-CM | POA: Diagnosis not present

## 2022-03-19 DIAGNOSIS — R519 Headache, unspecified: Secondary | ICD-10-CM

## 2022-03-19 DIAGNOSIS — I10 Essential (primary) hypertension: Secondary | ICD-10-CM | POA: Diagnosis not present

## 2022-03-19 DIAGNOSIS — G8929 Other chronic pain: Secondary | ICD-10-CM

## 2022-03-19 MED ORDER — HYDRALAZINE HCL 50 MG PO TABS: 50.0000 mg | ORAL_TABLET | Freq: Three times a day (TID) | ORAL | 4 refills | Status: DC

## 2022-03-19 NOTE — Patient Instructions (Addendum)
Not at goal. I recommend increasing hydralazine to 50 mg 3 times a day.  We will have you follow-up with the clinical pharmacist in 1 month for repeat blood pressure check.  Stop Motrin since she is on meloxicam.  Keep upcoming appointment with the oral surgeon.  We have referred her to a pain specialist at [facility] Ph. # [phone].

## 2022-03-19 NOTE — Progress Notes (Signed)
Patient ID: Brandy Galloway, female    DOB: [birth year]  MRN: [id number]  CC: 6 wks f/u HTN and chronic jaw pain  Hypertension (HTN f/u. Brandy Galloway about jaw. Feeling pain since oral surgery X8 mo ago. Brandy Galloway received flu vax this season.)   Subjective: Brandy Galloway is a 56 y.o. female who presents for 6 wks f/u HTN and jaw pain.  Her uncle and guardian Brandy Galloway is with her and gives hx.  Her concerns today include:  Patient with history of HTN, abnormal Pap smear with history of LGSIL, developmental delay since birth , seizure (only one time many yrs ago; not on med), anxiety, lumbar stenosis s/p laminectomy (Dr. Cyndy Freeze), chronic left-sided facial pain  Did not bring meds with them today.   HTN: On last visit, we had her stop metoprolol.  Restarted Norvasc instead.  Patient was to continue HCTZ 25 mg daily and hydralazine 25 mg 3 times a day. Uncle gives meds and reports compliance and took already this a.m. No device at home to check BP  LT facial pain:  Main issue.  appt with oral surgeon at [facility] 03/26/2022. Uncle says pt holds the LT side of her face and ask "why he do me like that."  He blames DDS Dr. Hoyt Koch.  Still not able to wear dentures.  Uncle gives her soft foods -Uncle gives her Lyrica 50 mg 2 tabs BID.  Also gives her Motrin once a day.  Also taking Meloxicam daily.  Uncle gives her Tylenol 500 mg once a day.  They have not heard from pain specialist at yet.  Referral was sent to [facility] clinic.    Patient is gained 12 pounds since September of this year.  Uncle reports that she has a good appetite.  States that she would like to eat regular food like pizza but has to eat soft food until issue with the gum pain is straightened out.  HM:  due for MMG, colon CA screen, Shingles vaccine.  Uncle declines shingles vaccine your blood pressure is Patient Active Problem List   Diagnosis Date Noted   Jaw pain 12/29/2021   AKI (acute kidney injury) ([place]) 12/29/2021   HTN  (hypertension) 12/29/2021   Lumbar back pain 11/22/2020   Spondylolisthesis of lumbar region 11/22/2020   Abnormal EKG 01/11/2020   Lumbar stenosis with neurogenic claudication 03/31/2019   LGSIL on Pap smear of cervix    Postmenopausal vaginal bleeding    Low grade squamous intraepith lesion on cytologic smear cervix (lgsil) 07/12/2011     Current Outpatient Medications on File Prior to Visit  Medication Sig Dispense Refill   acetaminophen (TYLENOL) 500 MG tablet Take 1 tablet (500 mg total) by mouth every 6 (six) hours as needed. 60 tablet 1   amLODipine (NORVASC) 10 MG tablet Take 1 tablet (10 mg total) by mouth daily. 90 tablet 1   diclofenac Sodium (VOLTAREN) 1 % GEL Apply 2 g topically in the morning and at bedtime. 100 g 1   hydrochlorothiazide (HYDRODIURIL) 25 MG tablet Take 25 mg by mouth every morning.     meloxicam (MOBIC) 15 MG tablet Take 15 mg by mouth daily.     pregabalin (LYRICA) 50 MG capsule Take 100 mg by mouth 2 (two) times daily.     triamcinolone cream (KENALOG) 0.5 % Apply 1 Application topically 2 (two) times daily as needed (itching.).     No current facility-administered medications on file prior to visit.    No  Known Allergies  Social History   Socioeconomic History   Marital status: Single    Spouse name: Not on file   Number of children: 0   Years of education: Not on file   Highest education level: Not on file  Occupational History   Not on file  Tobacco Use   Smoking status: Never   Smokeless tobacco: Never  Vaping Use   Vaping Use: Never used  Substance and Sexual Activity   Alcohol use: Never   Drug use: Never   Sexual activity: Never    Birth control/protection: None  Other Topics Concern   Not on file  Social History Narrative   08/01/21 Lives with her uncle who is her legal guardian, Brandy Galloway.         Left Handed       Social Determinants of Health   Financial Resource Strain: Not on file  Food Insecurity: Not on file   Transportation Needs: Not on file  Physical Activity: Not on file  Stress: Not on file  Social Connections: Not on file  Intimate Partner Violence: Not on file    Family History  Problem Relation Age of Onset   Diabetes Mother    Diabetes Father     Past Surgical History:  Procedure Laterality Date   CERVICAL CONE BIOPSY  04/23/2007   CERVICAL CONIZATION W/BX  08/20/2011   Procedure: CONIZATION CERVIX WITH BIOPSY;  Surgeon: Emily Filbert, MD;  Location: [facility];  Service: Gynecology;  Laterality: N/A;  With Endocervical Currettage   COLPOSCOPY  06/21/2011   COLPOSCOPY N/A 05/28/2016   Procedure: COLPOSCOPY;  Surgeon: Mora Bellman, MD;  Location: [facility];  Service: Gynecology;  Laterality: N/A;   DILATION AND CURETTAGE OF UTERUS N/A 05/28/2016   Procedure: DILATATION AND CURETTAGE;  Surgeon: Mora Bellman, MD;  Location: [facility];  Service: Gynecology;  Laterality: N/A;   LEEP  04/23/2007   LUMBAR LAMINECTOMY/DECOMPRESSION MICRODISCECTOMY N/A 03/31/2019   Procedure: LAMINECTOMY LUMBAR THREE- LUMBAR FOUR, LUMBAR FOUR- LUMBAR FIVE;  Surgeon: Ashok Pall, MD;  Location: [facility];  Service: Neurosurgery;  Laterality: N/A;  LAMINECTOMY LUMBAR THREE- LUMBAR FOUR, LUMBAR FOUR- LUMBAR FIVE   MOUTH SURGERY Left 06/2021   Jaw surgery   RADIOLOGY WITH ANESTHESIA N/A 10/17/2020   Procedure: MRI WITH ANESTHESIA LUMBAR SPINE WITH AND WITHOUT CONTRAST;  Surgeon: Radiologist, Medication, MD;  Location: [place];  Service: Radiology;  Laterality: N/A;   RADIOLOGY WITH ANESTHESIA N/A 11/06/2021   Procedure: MRI WITH ANESTHESIA OF BRAIN WITH AND WITHOUT CONTRAST, MR ANGIOGRAM OF HEAD WITHOUT CONTRAST;  Surgeon: Radiologist, Medication, MD;  Location: [place];  Service: Radiology;  Laterality: N/A;   TOOTH EXTRACTION N/A 06/29/2021   Procedure: REMOVAL BILATERAL MANDIBULAR LINGUAL TORI;  Surgeon: Diona Browner, DMD;  Location: [facility];  Service: Oral Surgery;  Laterality: N/A;    ROS: Review of  Systems Negative except as stated above  PHYSICAL EXAM: BP (!) 160/78   Pulse 75   Ht '5\' 8"'$  (1.727 m)   Wt 180 lb (81.6 kg)   LMP  (LMP Unknown)   SpO2 98%   BMI 27.37 kg/m   Wt Readings from Last 3 Encounters:  03/19/22 180 lb (81.6 kg)  01/29/22 174 lb 9.6 oz (79.2 kg)  12/27/21 168 lb 8 oz (76.4 kg)    Physical Exam  General appearance - alert, well appearing, middle-age African-American female and in no distress Mental status -patient mumbles.  Not able to give history. Neck - supple,  no significant adenopathy Mouth: Patient is edentulous.  No lesions seen in the mouth at this time.  Oral mucosa is moist. Chest - clear to auscultation, no wheezes, rales or rhonchi, symmetric air entry Heart - normal rate, regular rhythm, normal S1, S2, no murmurs, rubs, clicks or gallops Extremities - peripheral pulses normal, no pedal edema, no clubbing or cyanosis      Latest Ref Rng & Units 12/27/2021   10:30 AM 11/06/2021    7:24 AM 06/29/2021    8:30 AM  CMP  Glucose 70 - 99 mg/dL 78  91  101   BUN 6 - 24 mg/dL '19  21  16   '$ Creatinine 0.57 - 1.00 mg/dL 0.96  1.14  0.86   Sodium 134 - 144 mmol/L 141  142  139   Potassium 3.5 - 5.2 mmol/L 4.1  3.9  3.8   Chloride 96 - 106 mmol/L 102  108  105   CO2 20 - 29 mmol/L '24  24  26   '$ Calcium 8.7 - 10.2 mg/dL 9.7  9.5  9.5    Lipid Panel  No results found for: "CHOL", "TRIG", "HDL", "CHOLHDL", "VLDL", "LDLCALC", "LDLDIRECT"  CBC    Component Value Date/Time   WBC 7.3 11/06/2021 0724   RBC 3.78 (L) 11/06/2021 0724   HGB 11.6 (L) 11/06/2021 0724   HCT 34.8 (L) 11/06/2021 0724   PLT 282 11/06/2021 0724   MCV 92.1 11/06/2021 0724   MCH 30.7 11/06/2021 0724   MCHC 33.3 11/06/2021 0724   RDW 12.9 11/06/2021 0724    ASSESSMENT AND PLAN: 1. Essential hypertension Not at goal.  Increase hydralazine to 50 mg 3 times a day.  Follow-up with clinical pharmacist in 4 weeks for repeat blood pressure check.  Advised her uncle to bring all  medicines with them at that visit. - hydrALAZINE (APRESOLINE) 50 MG tablet; Take 1 tablet (50 mg total) by mouth 3 (three) times daily.  Dispense: 90 tablet; Refill: 4  2. Chronic facial pain Keep upcoming appointment with oral surgeon at [facility]. Advised to stop Motrin since she is on meloxicam.  Advised that she can take the Tylenol 500 mg up to 4 times a day as needed.  Continue Lyrica.  Message sent to our referral coordinator inquiring about the referral to the pain specialist  3. Overweight (BMI 25.0-29.9) Encouraged uncle to purchase healthy foods for her and to have her snack on soft fruits rather than junk foods.  4. Encounter for screening mammogram for malignant neoplasm of breast - MM Digital Screening; Future  5. Screening for colon cancer Discussed colon cancer screening methods.  Uncle prefers to have them do the Cologuard test. - Cologuard   Patient was given the opportunity to ask questions.  Patient verbalized understanding of the plan and was able to repeat key elements of the plan.   This documentation was completed using Radio producer.  Any transcriptional errors are unintentional.  Orders Placed This Encounter  Procedures   MM Digital Screening   Cologuard     Requested Prescriptions   Signed Prescriptions Disp Refills   hydrALAZINE (APRESOLINE) 50 MG tablet 90 tablet 4    Sig: Take 1 tablet (50 mg total) by mouth 3 (three) times daily.    Return in about 4 months (around 07/18/2022) for Appt with [facility] in 4 wks for BP check.  Karle Plumber, MD, FACP

## 2022-03-25 ENCOUNTER — Telehealth: Payer: Self-pay | Admitting: Emergency Medicine

## 2022-03-25 NOTE — Telephone Encounter (Signed)
Copied from [place]. Topic: Referral - Status >> Mar 25, 2022  9:24 AM Chapman Fitch wrote: Reason for CRM: Pt was suppose to receive a referral to [place] for the issue pt has with her Jaw / pts uncle called [facility] and was advised pt that Dr. Wynetta Emery was suppose to schedule pts appt / please advise asap

## 2022-03-26 ENCOUNTER — Telehealth: Payer: Self-pay | Admitting: Emergency Medicine

## 2022-03-26 NOTE — Telephone Encounter (Signed)
Copied from [place] [phone]. Topic: General - Inquiry >> Mar 26, 2022  3:19 PM Leilani Able wrote: Pt guardian , [name], guardian of PT,  requesting something for pain with mouth surgery, Pt is crying all the time, have an appt with pain clinic but not till Jan, want to know if can get something to ease pain, what they have is not touching it very concerned fu with [name] at [phone] says he just doesn't know what to do.   [phone]

## 2022-03-26 NOTE — Telephone Encounter (Signed)
Copied from [place] [phone]. Topic: General - Inquiry >> Mar 26, 2022  3:06 PM Leilani Able wrote: Pt with uncle, [name], guardian,   states pt is crying almost none stop with pain from the surgery. She has no pain med that is working, next month they do have an appt for a pain clinic in January. He is just grasping for something to help her stop crying. [phone] Pls fu as he seems very desperate for something to comfort her.[id number], [place]  Phone: [phone] Fax: [phone]

## 2022-03-27 ENCOUNTER — Telehealth: Payer: Self-pay

## 2022-03-27 NOTE — Telephone Encounter (Signed)
Pt's uncle Charlotte Crumb, calling to see what he needs to do about cologuard sample. Advised him to place in box and then seal up and put UPS label on the box. Tried to schedule pick up for him but tracking # invalid. Believe that he had wrong label and not label to send back. Advised him to call customer service # to request shipping label. He verbalized understanding.

## 2022-03-27 NOTE — Telephone Encounter (Signed)
Called and spoke to [place], the patient's uncle & legal guardian. He stated that they have already been contacted and have been scheduled an appointment at [facility] on 04/02/2022.

## 2022-03-28 NOTE — Telephone Encounter (Signed)
We were able to get appt moved up with [facility] to 04/02/2022.

## 2022-04-02 DIAGNOSIS — I10 Essential (primary) hypertension: Secondary | ICD-10-CM | POA: Diagnosis not present

## 2022-04-02 DIAGNOSIS — M129 Arthropathy, unspecified: Secondary | ICD-10-CM | POA: Diagnosis not present

## 2022-04-02 DIAGNOSIS — Z79899 Other long term (current) drug therapy: Secondary | ICD-10-CM | POA: Diagnosis not present

## 2022-04-02 DIAGNOSIS — E559 Vitamin D deficiency, unspecified: Secondary | ICD-10-CM | POA: Diagnosis not present

## 2022-04-02 DIAGNOSIS — R5383 Other fatigue: Secondary | ICD-10-CM | POA: Diagnosis not present

## 2022-04-02 DIAGNOSIS — Z1331 Encounter for screening for depression: Secondary | ICD-10-CM | POA: Diagnosis not present

## 2022-04-02 DIAGNOSIS — Z6830 Body mass index (BMI) 30.0-30.9, adult: Secondary | ICD-10-CM | POA: Diagnosis not present

## 2022-04-02 DIAGNOSIS — Z1159 Encounter for screening for other viral diseases: Secondary | ICD-10-CM | POA: Diagnosis not present

## 2022-04-02 DIAGNOSIS — R519 Headache, unspecified: Secondary | ICD-10-CM | POA: Diagnosis not present

## 2022-04-10 DIAGNOSIS — E559 Vitamin D deficiency, unspecified: Secondary | ICD-10-CM | POA: Diagnosis not present

## 2022-04-10 DIAGNOSIS — Z79899 Other long term (current) drug therapy: Secondary | ICD-10-CM | POA: Diagnosis not present

## 2022-04-10 DIAGNOSIS — R519 Headache, unspecified: Secondary | ICD-10-CM | POA: Diagnosis not present

## 2022-04-10 DIAGNOSIS — I10 Essential (primary) hypertension: Secondary | ICD-10-CM | POA: Diagnosis not present

## 2022-04-10 DIAGNOSIS — Z6830 Body mass index (BMI) 30.0-30.9, adult: Secondary | ICD-10-CM | POA: Diagnosis not present

## 2022-04-11 ENCOUNTER — Telehealth: Payer: Self-pay | Admitting: Internal Medicine

## 2022-04-11 NOTE — Telephone Encounter (Signed)
Patient's guardian Mr Francene Boyers in about Dr  Emelia Loron patel prescribed Dr Oxycodone to patient but he is having problems getting this for her. He says he was also told by pharmacy that they need a PA, he said Pr but it seems like he means PA and was given a hard time by with front desk at pain management at [place] [facility] office.Marland Kitchen He is asking for assistance with this asap, because patient is having jaw pain.

## 2022-04-12 NOTE — Telephone Encounter (Signed)
Guardian states that matter has been taken care of.

## 2022-04-18 ENCOUNTER — Ambulatory Visit: Payer: Medicaid Other | Admitting: Pharmacist

## 2022-05-07 ENCOUNTER — Other Ambulatory Visit: Payer: Self-pay | Admitting: Internal Medicine

## 2022-05-13 DIAGNOSIS — I10 Essential (primary) hypertension: Secondary | ICD-10-CM | POA: Diagnosis not present

## 2022-05-13 DIAGNOSIS — Z79899 Other long term (current) drug therapy: Secondary | ICD-10-CM | POA: Diagnosis not present

## 2022-05-13 DIAGNOSIS — M79673 Pain in unspecified foot: Secondary | ICD-10-CM | POA: Diagnosis not present

## 2022-05-13 DIAGNOSIS — Z6830 Body mass index (BMI) 30.0-30.9, adult: Secondary | ICD-10-CM | POA: Diagnosis not present

## 2022-05-17 ENCOUNTER — Ambulatory Visit: Payer: Medicaid Other

## 2022-05-22 NOTE — Progress Notes (Signed)
   S:     PCP: Dr. Wynetta Emery  57 y.o. female who presents for hypertension evaluation, education, and management.  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  Patient was referred and last seen by Primary Care Provider, Dr. Wynetta Emery on 03/19/2022.  At last visit, BP was elevated at 160/78. Reported compliance. Hydralazine was increased to '50mg'$  TID.   Today, patient arrives in spirits and presents with the assistance of her uncle/guardian [name].  Denies dizziness, headache, blurred vision, swelling. They report some confusion regarding the instructions after her most recent visit. They believed that she was only to take hydralazine for BP. Reported that she was taking meloxicam '15mg'$  TID.   Patient reports hypertension is longstanding  Family/Social history:  -Fhx: DM -Tobacco: denies -Alcohol: denies  Medication adherence appropriate to hydralazine, patient has taken this BP medication today.   Current antihypertensives include: amlodipine '10mg'$  once daily, hydralazine '50mg'$  TID (only taking hydralazine), HCTZ '25mg'$  qam  Reported home BP readings: not checking   Patient reported dietary habits:  -tries to cook at home and limits salt -will get take out every now and then  Patient-reported exercise habits:  -loves to dance   O:   Vitals:   05/23/22 1542  BP: (!) 198/95  Pulse: 98     Last 3 Office BP readings: BP Readings from Last 3 Encounters:  03/19/22 (!) 160/78  02/25/22 (!) 157/72  01/29/22 (!) 170/80    BMET    Component Value Date/Time   NA 141 12/27/2021 1030   K 4.1 12/27/2021 1030   CL 102 12/27/2021 1030   CO2 24 12/27/2021 1030   GLUCOSE 78 12/27/2021 1030   GLUCOSE 91 11/06/2021 0724   BUN 19 12/27/2021 1030   CREATININE 0.96 12/27/2021 1030   CALCIUM 9.7 12/27/2021  1030   GFRNONAA 56 (L) 11/06/2021 0724   GFRAA >60 03/29/2019 1042    Renal function: CrCl cannot be calculated (Patient's most recent lab result is older than the maximum 21 days allowed.).   A/P: Hypertension diagnosed currently uncontrolled on current medications. BP goal < 130/80 mmHg. Medication adherence appears suboptimal. Control is suboptimal due to confusion regarding regimen.  -Restarted amlodipine '10mg'$  once daily. -Restarted HCTZ '25mg'$  once daily -Continued hydralazine '50mg'$  TID  -Instructed to only take mleoxicam '15mg'$  once daily.  -Patient educated on purpose, proper use, and potential adverse effects of amlodipine, HCTZ.  -F/u labs ordered - lipid panel, A1c, CMP -Counseled on lifestyle modifications for blood pressure control including reduced dietary sodium, increased exercise, adequate sleep. -Encouraged patient to check BP at home and bring log of readings to next visit. Counseled on proper use of home BP cuff.    Results reviewed and written information provided.    Written patient instructions provided. Patient verbalized understanding of treatment plan.  Total time in face to face counseling 30 minutes.    Follow-up:  Pharmacist in 2-3 weeks. PCP clinic visit in 07/18/2022  Maryan Puls, PharmD PGY-1 [facility] Pharm[facility] Resident

## 2022-05-23 ENCOUNTER — Other Ambulatory Visit: Payer: Self-pay | Admitting: Pharmacist

## 2022-05-23 ENCOUNTER — Ambulatory Visit: Payer: Medicaid Other | Attending: Family Medicine | Admitting: Pharmacist

## 2022-05-23 ENCOUNTER — Encounter: Payer: Self-pay | Admitting: Pharmacist

## 2022-05-23 VITALS — BP 198/95 | HR 98

## 2022-05-23 DIAGNOSIS — I10 Essential (primary) hypertension: Secondary | ICD-10-CM

## 2022-05-23 DIAGNOSIS — E663 Overweight: Secondary | ICD-10-CM | POA: Diagnosis not present

## 2022-05-23 MED ORDER — AMLODIPINE BESYLATE 10 MG PO TABS: 10.0000 mg | ORAL_TABLET | Freq: Every day | ORAL | 1 refills | Status: DC

## 2022-05-23 MED ORDER — HYDROCHLOROTHIAZIDE 25 MG PO TABS: 25.0000 mg | ORAL_TABLET | Freq: Every morning | ORAL | 1 refills | Status: DC

## 2022-05-24 ENCOUNTER — Other Ambulatory Visit: Payer: Self-pay | Admitting: Internal Medicine

## 2022-05-24 LAB — CMP14+EGFR
ALT: 11 IU/L (ref 0–32)
AST: 15 IU/L (ref 0–40)
Albumin/Globulin Ratio: 1.5 (ref 1.2–2.2)
Albumin: 4.6 g/dL (ref 3.8–4.9)
Alkaline Phosphatase: 123 IU/L — ABNORMAL HIGH (ref 44–121)
BUN/Creatinine Ratio: 13 (ref 9–23)
BUN: 14 mg/dL (ref 6–24)
Bilirubin Total: 0.5 mg/dL (ref 0.0–1.2)
CO2: 25 mmol/L (ref 20–29)
Calcium: 9.8 mg/dL (ref 8.7–10.2)
Chloride: 103 mmol/L (ref 96–106)
Creatinine, Ser: 1.09 mg/dL — ABNORMAL HIGH (ref 0.57–1.00)
Globulin, Total: 3 g/dL (ref 1.5–4.5)
Glucose: 143 mg/dL — ABNORMAL HIGH (ref 70–99)
Potassium: 3.6 mmol/L (ref 3.5–5.2)
Sodium: 142 mmol/L (ref 134–144)
Total Protein: 7.6 g/dL (ref 6.0–8.5)
eGFR: 59 mL/min/{1.73_m2} — ABNORMAL LOW (ref >59)

## 2022-05-24 LAB — HEMOGLOBIN A1C
Est. average glucose Bld gHb Est-mCnc: 120 mg/dL
Hgb A1c MFr Bld: 5.8 % — ABNORMAL HIGH (ref 4.8–5.6)

## 2022-05-24 LAB — LIPID PANEL
Chol/HDL Ratio: 3.6 ratio (ref 0.0–4.4)
Cholesterol, Total: 193 mg/dL (ref 100–199)
HDL: 53 mg/dL (ref >39)
LDL Chol Calc (NIH): 120 mg/dL — ABNORMAL HIGH (ref 0–99)
Triglycerides: 114 mg/dL (ref 0–149)
VLDL Cholesterol Cal: 20 mg/dL (ref 5–40)

## 2022-05-27 ENCOUNTER — Other Ambulatory Visit: Payer: Self-pay | Admitting: Internal Medicine

## 2022-05-27 DIAGNOSIS — E785 Hyperlipidemia, unspecified: Secondary | ICD-10-CM

## 2022-05-27 DIAGNOSIS — R7303 Prediabetes: Secondary | ICD-10-CM

## 2022-06-11 ENCOUNTER — Ambulatory Visit: Payer: Self-pay | Admitting: Pharmacist

## 2022-06-14 DIAGNOSIS — M79673 Pain in unspecified foot: Secondary | ICD-10-CM | POA: Diagnosis not present

## 2022-06-14 DIAGNOSIS — Z79899 Other long term (current) drug therapy: Secondary | ICD-10-CM | POA: Diagnosis not present

## 2022-06-14 DIAGNOSIS — I10 Essential (primary) hypertension: Secondary | ICD-10-CM | POA: Diagnosis not present

## 2022-06-14 DIAGNOSIS — Z6827 Body mass index (BMI) 27.0-27.9, adult: Secondary | ICD-10-CM | POA: Diagnosis not present

## 2022-06-14 DIAGNOSIS — R519 Headache, unspecified: Secondary | ICD-10-CM | POA: Diagnosis not present

## 2022-06-18 DIAGNOSIS — Z79899 Other long term (current) drug therapy: Secondary | ICD-10-CM | POA: Diagnosis not present

## 2022-06-26 NOTE — Progress Notes (Deleted)
S:     PCP: Dr. Wynetta Emery   57 y.o. female who presents for hypertension evaluation, education, and management.  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  Patient was referred and last seen by Primary Care Provider, Dr. Wynetta Emery on 03/19/2022.  At last visit with PCP, BP was elevated at 160/78. Reported compliance. Hydralazine was increased to '50mg'$  TID.   At last visit with me, there was some confusion regarding the instructions for her BP medications. She had only been taking hydralazine at that time. Education provided and instructed to follow up in a month.  Today, patient arrives in *** spirits and presents without *** assistance. *** Denies dizziness, headache, blurred vision, swelling.   Patient reports hypertension is longstanding   Family/Social history:  -Fhx: DM -Tobacco: denies -Alcohol: denies  Medication adherence *** . Patient has *** taken BP medications today.   Current antihypertensives include: amlodipine '10mg'$  once daily, hydralazine '50mg'$  TID, HCTZ '25mg'$  qam   Reported home BP readings: ***  Patient reported dietary habits:  -tries to cook at home and limits salt -will get take out every now and then   Patient-reported exercise habits:  -loves to dance   O:  ROS  Physical Exam  Last 3 Office BP readings: BP Readings from Last 3 Encounters:  05/23/22 (!) 198/95  03/19/22 (!) 160/78  02/25/22 (!) 157/72    BMET    Component Value Date/Time   NA 142 05/23/2022 1601   K 3.6 05/23/2022 1601   CL 103 05/23/2022 1601   CO2 25 05/23/2022 1601   GLUCOSE 143 (H) 05/23/2022 1601   GLUCOSE 91 11/06/2021 0724   BUN 14 05/23/2022 1601   CREATININE 1.09 (H) 05/23/2022 1601   CALCIUM 9.8 05/23/2022 1601   GFRNONAA 56 (L) 11/06/2021 0724   GFRAA >60 03/29/2019 1042     Renal function: CrCl cannot be calculated (Patient's most recent lab result is older than the maximum 21 days allowed.).  Clinical ASCVD: {YES/NO:21197} The 10-year ASCVD risk score (Arnett DK, et al., 2019) is: 14.4%   Values used to calculate the score:     Age: 29 years     Sex: Female     Is Non-Hispanic African American: Yes     Diabetic: No     Tobacco smoker: No     Systolic Blood Pressure: [id number] mmHg     Is BP treated: Yes     HDL Cholesterol: 53 mg/dL     Total Cholesterol: 193 mg/dL  Patient is participating in a Managed Medicaid Plan:  {MM YES/NO:27447::"Yes"}    A/P: Hypertension diagnosed *** currently *** on current medications. BP goal < 130/80 *** mmHg. Medication adherence appears ***. Control is suboptimal due to ***.  -{Meds adjust:18428} ***.  -{Meds adjust:18428} ***.  -Patient educated on purpose, proper use, and potential adverse effects of ***.  -F/u labs ordered - *** -Counseled on lifestyle modifications for blood pressure control including reduced dietary sodium, increased exercise, adequate sleep. -Encouraged patient to check BP at home and bring log of readings to next visit. Counseled on proper use of home BP cuff.   Results reviewed and written information provided.    Written patient instructions provided. Patient verbalized understanding of treatment plan.  Total time in face to face counseling *** minutes.    Follow-up:  Pharmacist ***.  PCP clinic visit in 07/18/2022  Maryan Puls, PharmD PGY-1 [facility] Pharmacy Resident

## 2022-06-27 ENCOUNTER — Ambulatory Visit: Payer: Medicaid Other | Admitting: Pharmacist

## 2022-06-28 ENCOUNTER — Ambulatory Visit: Payer: Medicaid Other | Attending: Internal Medicine | Admitting: Pharmacist

## 2022-06-28 ENCOUNTER — Encounter: Payer: Self-pay | Admitting: Pharmacist

## 2022-06-28 VITALS — BP 120/70 | HR 111

## 2022-06-28 DIAGNOSIS — I10 Essential (primary) hypertension: Secondary | ICD-10-CM | POA: Diagnosis not present

## 2022-06-28 NOTE — Progress Notes (Signed)
   S:     PCP: Dr. Wynetta Emery  57 y.o. female who presents for hypertension evaluation, education, and management.  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  Patient was referred and last seen by Primary Care Provider, Dr. Wynetta Emery on 03/19/2022. We saw her on 05/23/2022. Her BP was high but she was only taking hydralazine at the time.   Today, patient arrives in spirits and presents with the assistance of her uncle/guardian [name].  Denies dizziness, headache, blurred vision, swelling. Patient reports hypertension is longstanding  Family/Social history:  -Fhx: DM -Tobacco: denies -Alcohol: denies  Medication adherence reported. She has taken BP medication today. Has is now taking all 3 antihypertensives as prescribed.   Current antihypertensives include: amlodipine 10mg  once daily, hydralazine 50mg  TID, HCTZ 25mg  qam  Reported home BP readings: not checking   Patient reported dietary habits:  -tries to cook at home and limits salt -will get take out every now and then  Patient-reported exercise habits:  -loves to dance   O:   Vitals:   06/28/22 1459  BP: 120/70  Pulse: (!) 111    Last 3 Office BP readings: BP Readings from Last 3 Encounters:  06/28/22 120/70  05/23/22 (!) 198/95  03/19/22 (!) 160/78    BMET    Component Value Date/Time   NA 142 05/23/2022 1601   K 3.6 05/23/2022 1601   CL 103 05/23/2022 1601   CO2 25 05/23/2022 1601   GLUCOSE 143 (H) 05/23/2022 1601   GLUCOSE 91 11/06/2021 0724   BUN 14 05/23/2022 1601   CREATININE 1.09 (H) 05/23/2022 1601   CALCIUM 9.8 05/23/2022 1601   GFRNONAA 56 (L) 11/06/2021 0724   GFRAA >60 03/29/2019 1042    Renal function: CrCl cannot be calculated (Patient's most recent lab result is older than the maximum 21 days  allowed.).   A/P: Hypertension diagnosed currently at goal.on current medications. BP goal < 130/80 mmHg. Medication adherence is now optimal. Her BP is surprising. She's had consistently elevated BP in clinic. I wonder if adherence is a much bigger issue than I initially suspected. Emphasized how critical medication adherence is and for pt to take her BP medications the day of her appt.   -Continued amlodipine 10mg  once daily. -Continued HCTZ 25mg  once daily -Continued hydralazine 50mg  TID  -Counseled on lifestyle modifications for blood pressure control including reduced dietary sodium, increased exercise, adequate sleep. -Encouraged patient to check BP at home and bring log of readings to next visit. Counseled on proper use of home BP cuff.    Results reviewed and written information provided.    Written patient instructions provided. Patient verbalized understanding of treatment plan.  Total time in face to face counseling 30 minutes.    Follow-up:  PCP clinic visit in 07/18/2022. Me in April.  Benard Halsted, PharmD, [place], [place] [phone]

## 2022-07-01 ENCOUNTER — Other Ambulatory Visit: Payer: Self-pay | Admitting: Internal Medicine

## 2022-07-02 ENCOUNTER — Emergency Department (HOSPITAL_COMMUNITY)
Admission: EM | Admit: 2022-07-02 | Discharge: 2022-07-02 | Discharge status: Against Medical Advice | Payer: Medicaid Other | Attending: Emergency Medicine | Admitting: Emergency Medicine

## 2022-07-02 ENCOUNTER — Ambulatory Visit (HOSPITAL_COMMUNITY)
Admission: EM | Admit: 2022-07-02 | Discharge: 2022-07-02 | Disposition: A | Discharge status: Home / Self Care | Payer: Medicaid Other | Attending: Internal Medicine | Admitting: Internal Medicine

## 2022-07-02 ENCOUNTER — Encounter (HOSPITAL_COMMUNITY): Payer: Self-pay

## 2022-07-02 ENCOUNTER — Other Ambulatory Visit: Payer: Self-pay

## 2022-07-02 ENCOUNTER — Ambulatory Visit: Payer: Self-pay

## 2022-07-02 ENCOUNTER — Ambulatory Visit: Payer: Self-pay | Admitting: *Deleted

## 2022-07-02 DIAGNOSIS — R1032 Left lower quadrant pain: Secondary | ICD-10-CM | POA: Diagnosis not present

## 2022-07-02 DIAGNOSIS — R630 Anorexia: Secondary | ICD-10-CM | POA: Diagnosis not present

## 2022-07-02 DIAGNOSIS — R109 Unspecified abdominal pain: Secondary | ICD-10-CM | POA: Diagnosis not present

## 2022-07-02 DIAGNOSIS — Z5321 Procedure and treatment not carried out due to patient leaving prior to being seen by health care provider: Secondary | ICD-10-CM | POA: Insufficient documentation

## 2022-07-02 LAB — COMPREHENSIVE METABOLIC PANEL
ALT: 29 U/L (ref 0–44)
AST: 22 U/L (ref 15–41)
Albumin: 4.8 g/dL (ref 3.5–5.0)
Alkaline Phosphatase: 117 U/L (ref 38–126)
Anion gap: 14 (ref 5–15)
BUN: 23 mg/dL — ABNORMAL HIGH (ref 6–20)
CO2: 26 mmol/L (ref 22–32)
Calcium: 10.2 mg/dL (ref 8.9–10.3)
Chloride: 96 mmol/L — ABNORMAL LOW (ref 98–111)
Creatinine, Ser: 1.3 mg/dL — ABNORMAL HIGH (ref 0.44–1.00)
GFR, Estimated: 48 mL/min — ABNORMAL LOW (ref >60)
Glucose, Bld: 127 mg/dL — ABNORMAL HIGH (ref 70–99)
Potassium: 3.7 mmol/L (ref 3.5–5.1)
Sodium: 136 mmol/L (ref 135–145)
Total Bilirubin: 1.3 mg/dL — ABNORMAL HIGH (ref 0.3–1.2)
Total Protein: 9 g/dL — ABNORMAL HIGH (ref 6.5–8.1)

## 2022-07-02 LAB — CBC WITH DIFFERENTIAL/PLATELET
Abs Immature Granulocytes: 0.01 10*3/uL (ref 0.00–0.07)
Basophils Absolute: 0.1 10*3/uL (ref 0.0–0.1)
Basophils Relative: 1 %
Eosinophils Absolute: 0.1 10*3/uL (ref 0.0–0.5)
Eosinophils Relative: 1 %
HCT: 42.2 % (ref 36.0–46.0)
Hemoglobin: 14.1 g/dL (ref 12.0–15.0)
Immature Granulocytes: 0 %
Lymphocytes Relative: 43 %
Lymphs Abs: 2.8 10*3/uL (ref 0.7–4.0)
MCH: 29.3 pg (ref 26.0–34.0)
MCHC: 33.4 g/dL (ref 30.0–36.0)
MCV: 87.6 fL (ref 80.0–100.0)
Monocytes Absolute: 0.7 10*3/uL (ref 0.1–1.0)
Monocytes Relative: 11 %
Neutro Abs: 2.8 10*3/uL (ref 1.7–7.7)
Neutrophils Relative %: 44 %
Platelets: 334 10*3/uL (ref 150–400)
RBC: 4.82 MIL/uL (ref 3.87–5.11)
RDW: 13.3 % (ref 11.5–15.5)
WBC: 6.4 10*3/uL (ref 4.0–10.5)
nRBC: 0 % (ref 0.0–0.2)

## 2022-07-02 NOTE — Telephone Encounter (Signed)
See previous triage regarding abdominal pain left side. Recommended UC  due to pain comes and goes and sx cause patient to cry in pain.

## 2022-07-02 NOTE — ED Provider Triage Note (Signed)
Emergency Medicine Provider Triage Evaluation Note  Brandy Galloway , a 57 y.o. female  was evaluated in triage.  Pt complains of abdominal pain.  Patient has reportedly been experiencing 3 days of poor appetite and complaining of left lower quadrant abdominal pain.  She was seen at urgent care today and sent here for further evaluation given location of abdominal pain and elevated heart rate.  Patient denies any significant nausea, vomiting, diarrhea, urinary changes.  No prior history of diverticulitis or diverticulosis.  Per guardian, patient had 1 episode of vomiting yesterday.  Review of Systems  Positive: As above Negative: As above  Physical Exam  BP (!) 163/95 (BP Location: Left Arm)   Pulse (!) 111   Temp 99.4 F (37.4 C) (Oral)   Resp 17   Ht '5\' 8"'$  (1.727 m)   Wt 81.6 kg   LMP  (LMP Unknown)   SpO2 97%   BMI 27.35 kg/m  Gen:   Awake, no distress   Resp:  Normal effort  MSK:   Moves extremities without difficulty  Other:  LLQ TTP  Medical Decision Making  Medically screening exam initiated at 8:44 PM.  Appropriate orders placed.  Brandy Galloway was informed that the remainder of the evaluation will be completed by another provider, this initial triage assessment does not replace that evaluation, and the importance of remaining in the ED until their evaluation is complete.     [name], PA-C 07/02/22 2045

## 2022-07-02 NOTE — ED Notes (Signed)
Patient is being discharged from the Urgent Care and sent to the Emergency Department via POV . Per Ashlee PA, patient is in need of higher level of care due to acute abdomen, abnormal vital signs. Patient is aware and verbalizes understanding of plan of care.  Vitals:   07/02/22 1737 07/02/22 1829  BP: (!) 160/80 137/89  Pulse: (!) 128 (!) 112  Resp: 18   Temp: 98.6 F (37 C)   SpO2: 98%

## 2022-07-02 NOTE — Telephone Encounter (Signed)
Patient's uncle on DPR called back to ask if ok to wait until tomorrow to take patient to UC . Patient's uncle reports pain comes and goes and all of a sudden patient will be crying in pain. Recommended as previous recommendation to take patient to UC for evaluation. Patient's uncle reports he will take her now .

## 2022-07-02 NOTE — Telephone Encounter (Signed)
  Chief Complaint: Left upper abdominal pain Symptoms: above Frequency: 2-3 days Pertinent Negatives: Patient denies  Disposition: [] ED /[x] Urgent Care (no appt availability in office) / [] Appointment(In office/virtual)/ []  [facility]/ [] Home Care/ [] Refused Recommended Disposition /[] [facility]/ []  Follow-up with PCP Additional Notes: Spoke with uncle. He states that pt has had upper left side abdominal pain intermittently for the past 2-3 days. PT is not eating d/t pain. When pain starts pt becomes tearful.  Pt is not eating because it starts the pain. Pt has vomited 1x today. Uncle will take pt to UC this afternoon.    Summary: left side discomfort   The patient's uncle has called to share that the patient is experiencing discomfort on their left side near their chest  The patient's uncle would like to speak with a member of clinical staff when possible  Please contact further     Reason for Disposition  [1] MODERATE pain (e.g., interferes with normal activities) AND [2] pain comes and goes (cramps) AND [3] present > 24 hours  (Exception: Pain with Vomiting or Diarrhea - see that Guideline.)  Answer Assessment - Initial Assessment Questions 1. LOCATION: "Where does it hurt?"      Left upper abdominal pain 2. RADIATION: "Does the pain shoot anywhere else?" (e.g., chest, back)     no 3. ONSET: "When did the pain begin?" (e.g., minutes, hours or days ago)      2-3 days 4. SUDDEN: "Gradual or sudden onset?"     unsure 5. PATTERN "Does the pain come and go, or is it constant?"    - If it comes and goes: "How long does it last?" "Do you have pain now?"     (Note: Comes and goes means the pain is intermittent. It goes away completely between bouts.)    - If constant: "Is it getting better, staying the same, or getting worse?"      (Note: Constant means the pain never goes away completely; most serious pain is constant and gets worse.)      Comes and goes 6.  SEVERITY: "How bad is the pain?"  (e.g., Scale 1-10; mild, moderate, or severe)    - MILD (1-3): Doesn't interfere with normal activities, abdomen soft and not tender to touch.     - MODERATE (4-7): Interferes with normal activities or awakens from sleep, abdomen tender to touch.     - SEVERE (8-10): Excruciating pain, doubled over, unable to do any normal activities.       4-5-6/10 7. RECURRENT SYMPTOM: "Have you ever had this type of stomach pain before?" If Yes, ask: "When was the last time?" and "What happened that time?"      no 8. CAUSE: "What do you think is causing the stomach pain?"     no 9. RELIEVING/AGGRAVATING FACTORS: "What makes it better or worse?" (e.g., antacids, bending or twisting motion, bowel movement)     No - eating makes it worse 10. OTHER SYMPTOMS: "Do you have any other symptoms?" (e.g., back pain, diarrhea, fever, urination pain, vomiting)       Vomited this morning  Protocols used: Abdominal Pain - Female-A-AH

## 2022-07-02 NOTE — Discharge Instructions (Addendum)
Given that the patient has started crying out in pain today and abnormal vital signs at today's visit, recommend patient go directly to the ER for further evaluation/management. Patient is stable to discharge. She is going to ER via private vehicle drive by her legal guardian.

## 2022-07-02 NOTE — ED Provider Notes (Signed)
[facility]   Note:  This document was prepared using Dragon voice recognition software and may include unintentional dictation errors.  MRN: [id number] DOB: [birth year] DATE: 07/02/22   Subjective:  Chief Complaint:  Chief Complaint  Patient presents with   Abdominal Pain     HPI: Brandy Galloway is a 57 y.o. female presenting for left-sided abdominal pain for the past 3 days.  Patient is accompanied by her legal guardian and acts as historian due to mental disability.  Patient has been having intermittent abdominal pain, but this morning woke in tears from the pain. She has been unable to eat due to the pain.  Guardian states that she did not have anything to eat today. Had 1 episode of vomiting this week.  Last bowel movement was yesterday and was within normal limits for her.  She has been noted to clutching at her left side consistently.  Guardian is unsure of her last known colonoscopy.  She was recently prescribed Oxycodone for pain in her right foot, but has since been discontinued.  Denies fever, bloody stools, bloody emesis. Presents NAD  Prior to Admission medications   Medication Sig Start Date End Date Taking? Authorizing Provider  amLODipine (NORVASC) 10 MG tablet Take 1 tablet (10 mg total) by mouth daily. 05/23/22  Yes Ladell Pier, MD  hydrALAZINE (APRESOLINE) 50 MG tablet Take 1 tablet (50 mg total) by mouth 3 (three) times daily. 03/19/22  Yes Ladell Pier, MD  hydrochlorothiazide (HYDRODIURIL) 25 MG tablet Take 1 tablet (25 mg total) by mouth every morning. 05/23/22  Yes Ladell Pier, MD  acetaminophen (TYLENOL) 500 MG tablet Take 1 tablet (500 mg total) by mouth every 6 (six) hours as needed. 03/05/22   Ladell Pier, MD  diclofenac Sodium (VOLTAREN) 1 % GEL Apply 2 g topically in the morning and at bedtime. 01/29/22   Ladell Pier, MD  pregabalin (LYRICA) 50 MG capsule Take 100 mg by mouth 2 (two) times daily. 03/14/22   [provider]  triamcinolone cream (KENALOG) 0.5 % APPLY TOPICALLY TO THE AFFECTED AREA TWICE DAILY AS NEEDED 07/01/22   Ladell Pier, MD     No Known Allergies  History:   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/2011   Mental retardation    Microcephaly ([place])    Seizures ([place])    last one more than 20 yrs ago   Seizures ([place])    years ago; more than 20 years ago     Past Surgical History:  Procedure Laterality Date   CERVICAL CONE BIOPSY  04/23/2007   CERVICAL CONIZATION W/BX  08/20/2011   Procedure: CONIZATION CERVIX WITH BIOPSY;  Surgeon: Emily Filbert, MD;  Location: [facility];  Service: Gynecology;  Laterality: N/A;  With Endocervical Currettage   COLPOSCOPY  06/21/2011   COLPOSCOPY N/A 05/28/2016   Procedure: COLPOSCOPY;  Surgeon: Mora Bellman, MD;  Location: [facility];  Service: Gynecology;  Laterality: N/A;   DILATION AND CURETTAGE OF UTERUS N/A 05/28/2016   Procedure: DILATATION AND CURETTAGE;  Surgeon: Mora Bellman, MD;  Location: [facility];  Service: Gynecology;  Laterality: N/A;   LEEP  04/23/2007   LUMBAR LAMINECTOMY/DECOMPRESSION MICRODISCECTOMY N/A 03/31/2019   Procedure: LAMINECTOMY LUMBAR THREE- LUMBAR FOUR, LUMBAR FOUR- LUMBAR FIVE;  Surgeon: Ashok Pall, MD;  Location: [facility];  Service:  Neurosurgery;  Laterality: N/A;  LAMINECTOMY LUMBAR THREE- LUMBAR FOUR, LUMBAR FOUR- LUMBAR FIVE   MOUTH SURGERY Left 06/2021   Jaw surgery   RADIOLOGY WITH ANESTHESIA N/A 10/17/2020   Procedure: MRI WITH ANESTHESIA LUMBAR SPINE WITH AND WITHOUT CONTRAST;  Surgeon: Radiologist, Medication, MD;  Location: [facility];  Service: Radiology;  Laterality: N/A;   RADIOLOGY WITH ANESTHESIA N/A 11/06/2021   Procedure: MRI WITH ANESTHESIA OF BRAIN WITH AND WITHOUT CONTRAST, MR ANGIOGRAM OF HEAD WITHOUT CONTRAST;  Surgeon: Radiologist, Medication, MD;  Location: [facility];   Service: Radiology;  Laterality: N/A;   TOOTH EXTRACTION N/A 06/29/2021   Procedure: REMOVAL BILATERAL MANDIBULAR LINGUAL TORI;  Surgeon: Diona Browner, DMD;  Location: [facility];  Service: Oral Surgery;  Laterality: N/A;    Family History  Problem Relation Age of Onset   Diabetes Mother    Diabetes Father     Social History   Tobacco Use   Smoking status: Never   Smokeless tobacco: Never  Vaping Use   Vaping Use: Never used  Substance Use Topics   Alcohol use: Never   Drug use: Never    Review of Systems  Constitutional:  Positive for appetite change. Negative for fever.  Gastrointestinal:  Positive for abdominal pain, nausea and vomiting. Negative for blood in stool and diarrhea.  Genitourinary:  Negative for dysuria.     Objective:   Vitals: BP 137/89   Pulse (!) 112   Temp 98.6 F (37 C) (Oral)   Resp 18   LMP  (LMP Unknown)   SpO2 98%   Physical Exam Constitutional:      General: She is not in acute distress.    Appearance: Normal appearance. She is well-developed. She is not ill-appearing or toxic-appearing.  HENT:     Head: Normocephalic and atraumatic.  Cardiovascular:     Rate and Rhythm: Normal rate and regular rhythm.     Heart sounds: Normal heart sounds.  Pulmonary:     Effort: Pulmonary effort is normal.     Breath sounds: Normal breath sounds.     Comments: Clear to auscultation bilaterally  Abdominal:     General: Bowel sounds are normal.     Palpations: Abdomen is soft.     Tenderness: There is abdominal tenderness in the left upper quadrant and left lower quadrant. There is no right CVA tenderness or left CVA tenderness.       Comments: Patient is noted to be clutching her left side throughout the exam.  Tenderness palpation of the left lower quadrant.  Skin:    General: Skin is warm and dry.  Neurological:     General: No focal deficit present.     Mental Status: She is alert.  Psychiatric:        Mood and Affect: Mood and affect  normal.     Results:  Labs: No results found for this or any previous visit (from the past 24 hour(s)).  Radiology: No results found.   UC Course/Treatments:  Procedures: Procedures   Medications Ordered in UC: Medications - No data to display   Assessment and Plan :     ICD-10-CM   1. Left sided abdominal pain  R10.9      Left sided abdominal pain Afebrile, VSS. DDX includes but not limited to: Diverticulitis, cholecystitis, colon cancer Patient presented to urgent care complaining of left lower abdominal pain.   During triage, patient was noted to be tachycardic. At recheck, there was no improvement and patient remained  tachycardic.  No known history.  On exam, patient was noted to be clutching at her left side and was tender palpation over left lower quadrant.  Given presentation, tachycardia, and inability to tolerate foods PO, it is recommend that patient go directly to the ER for further evaluation/treatment/observation. Concern for possible diverticulitis.   Patient is stable for discharge to the emergency department. she will be going to the ER via private vehicle driven by her guardian.    ED Discharge Orders     None        PDMP not reviewed this encounter.     Carmie End, PA-C 07/02/22 1913

## 2022-07-02 NOTE — ED Triage Notes (Signed)
Started 3 days ago not eating and c/o of LLQ pain went to UC and they sent here for abnormal vitals pt and family unsure of what but was told she may have an infection.  Urinating without difficulty just c/o of abdominal pain.  Denies N/V at this time guardian states vomited yesterday x 1

## 2022-07-02 NOTE — ED Triage Notes (Addendum)
Brother in law states patient has been complaining of Left side pain. Last bowel movement was yesterday and normal. Pt denies any Dysuria at this time.  Pt denies any trauma or falls.

## 2022-07-02 NOTE — ED Notes (Signed)
Pt's visitor stated that he is going to take the pt home and come back in the morning because it's too late for them to be out.

## 2022-07-03 ENCOUNTER — Emergency Department (HOSPITAL_BASED_OUTPATIENT_CLINIC_OR_DEPARTMENT_OTHER): Payer: Medicaid Other

## 2022-07-03 ENCOUNTER — Encounter (HOSPITAL_BASED_OUTPATIENT_CLINIC_OR_DEPARTMENT_OTHER): Payer: Self-pay | Admitting: *Deleted

## 2022-07-03 ENCOUNTER — Ambulatory Visit (HOSPITAL_COMMUNITY): Payer: Medicaid Other

## 2022-07-03 ENCOUNTER — Ambulatory Visit: Payer: Self-pay

## 2022-07-03 ENCOUNTER — Emergency Department (HOSPITAL_BASED_OUTPATIENT_CLINIC_OR_DEPARTMENT_OTHER)
Admission: EM | Admit: 2022-07-03 | Discharge: 2022-07-03 | Disposition: A | Discharge status: Home / Self Care | Payer: Medicaid Other | Attending: Emergency Medicine | Admitting: Emergency Medicine

## 2022-07-03 ENCOUNTER — Other Ambulatory Visit: Payer: Self-pay

## 2022-07-03 DIAGNOSIS — E860 Dehydration: Secondary | ICD-10-CM | POA: Insufficient documentation

## 2022-07-03 DIAGNOSIS — N200 Calculus of kidney: Secondary | ICD-10-CM | POA: Diagnosis not present

## 2022-07-03 DIAGNOSIS — Z79899 Other long term (current) drug therapy: Secondary | ICD-10-CM | POA: Diagnosis not present

## 2022-07-03 DIAGNOSIS — R000 Tachycardia, unspecified: Secondary | ICD-10-CM | POA: Diagnosis not present

## 2022-07-03 DIAGNOSIS — R109 Unspecified abdominal pain: Secondary | ICD-10-CM

## 2022-07-03 DIAGNOSIS — R1032 Left lower quadrant pain: Secondary | ICD-10-CM | POA: Diagnosis not present

## 2022-07-03 DIAGNOSIS — I10 Essential (primary) hypertension: Secondary | ICD-10-CM | POA: Diagnosis not present

## 2022-07-03 LAB — URINALYSIS, ROUTINE W REFLEX MICROSCOPIC
Bacteria, UA: NONE SEEN
Bilirubin Urine: NEGATIVE
Glucose, UA: NEGATIVE mg/dL
Hgb urine dipstick: NEGATIVE
Ketones, ur: NEGATIVE mg/dL
Nitrite: NEGATIVE
Protein, ur: 30 mg/dL — AB
Specific Gravity, Urine: 1.02 (ref 1.005–1.030)
pH: 5.5 (ref 5.0–8.0)

## 2022-07-03 LAB — CBC WITH DIFFERENTIAL/PLATELET
Abs Immature Granulocytes: 0.02 10*3/uL (ref 0.00–0.07)
Basophils Absolute: 0.1 10*3/uL (ref 0.0–0.1)
Basophils Relative: 1 %
Eosinophils Absolute: 0.2 10*3/uL (ref 0.0–0.5)
Eosinophils Relative: 2 %
HCT: 41.3 % (ref 36.0–46.0)
Hemoglobin: 13.9 g/dL (ref 12.0–15.0)
Immature Granulocytes: 0 %
Lymphocytes Relative: 34 %
Lymphs Abs: 2.2 10*3/uL (ref 0.7–4.0)
MCH: 29.3 pg (ref 26.0–34.0)
MCHC: 33.7 g/dL (ref 30.0–36.0)
MCV: 87.1 fL (ref 80.0–100.0)
Monocytes Absolute: 0.5 10*3/uL (ref 0.1–1.0)
Monocytes Relative: 8 %
Neutro Abs: 3.7 10*3/uL (ref 1.7–7.7)
Neutrophils Relative %: 55 %
Platelets: 336 10*3/uL (ref 150–400)
RBC: 4.74 MIL/uL (ref 3.87–5.11)
RDW: 13.3 % (ref 11.5–15.5)
WBC: 6.7 10*3/uL (ref 4.0–10.5)
nRBC: 0 % (ref 0.0–0.2)

## 2022-07-03 LAB — COMPREHENSIVE METABOLIC PANEL
ALT: 36 U/L (ref 0–44)
AST: 31 U/L (ref 15–41)
Albumin: 5.2 g/dL — ABNORMAL HIGH (ref 3.5–5.0)
Alkaline Phosphatase: 102 U/L (ref 38–126)
Anion gap: 12 (ref 5–15)
BUN: 28 mg/dL — ABNORMAL HIGH (ref 6–20)
CO2: 30 mmol/L (ref 22–32)
Calcium: 10.8 mg/dL — ABNORMAL HIGH (ref 8.9–10.3)
Chloride: 94 mmol/L — ABNORMAL LOW (ref 98–111)
Creatinine, Ser: 1.21 mg/dL — ABNORMAL HIGH (ref 0.44–1.00)
GFR, Estimated: 52 mL/min — ABNORMAL LOW (ref >60)
Glucose, Bld: 121 mg/dL — ABNORMAL HIGH (ref 70–99)
Potassium: 3.5 mmol/L (ref 3.5–5.1)
Sodium: 136 mmol/L (ref 135–145)
Total Bilirubin: 1.5 mg/dL — ABNORMAL HIGH (ref 0.3–1.2)
Total Protein: 9.3 g/dL — ABNORMAL HIGH (ref 6.5–8.1)

## 2022-07-03 LAB — LIPASE, BLOOD: Lipase: 34 U/L (ref 11–51)

## 2022-07-03 MED ORDER — SUCRALFATE 1 G PO TABS: 1.0000 g | ORAL_TABLET | Freq: Three times a day (TID) | ORAL | 0 refills | Status: DC

## 2022-07-03 MED ORDER — ONDANSETRON HCL 4 MG PO TABS: 4.0000 mg | ORAL_TABLET | ORAL | 0 refills | Status: DC | PRN

## 2022-07-03 MED ORDER — LIDOCAINE VISCOUS HCL 2 % MT SOLN
15.0000 mL | Freq: Once | OROMUCOSAL | Status: AC
  Administered 2022-07-03: 15 mL via ORAL
  Filled 2022-07-03: qty 15

## 2022-07-03 MED ORDER — IOHEXOL 300 MG/ML  SOLN
100.0000 mL | Freq: Once | INTRAMUSCULAR | Status: AC | PRN
  Administered 2022-07-03: 85 mL via INTRAVENOUS

## 2022-07-03 MED ORDER — SODIUM CHLORIDE 0.9 % IV BOLUS
500.0000 mL | Freq: Once | INTRAVENOUS | Status: AC
  Administered 2022-07-03: 500 mL via INTRAVENOUS

## 2022-07-03 MED ORDER — FAMOTIDINE IN NACL 20-0.9 MG/50ML-% IV SOLN
20.0000 mg | Freq: Once | INTRAVENOUS | Status: AC
  Administered 2022-07-03: 20 mg via INTRAVENOUS
  Filled 2022-07-03: qty 50

## 2022-07-03 MED ORDER — SODIUM CHLORIDE 0.9 % IV BOLUS
1000.0000 mL | Freq: Once | INTRAVENOUS | Status: AC
  Administered 2022-07-03: 1000 mL via INTRAVENOUS

## 2022-07-03 MED ORDER — ALUM & MAG HYDROXIDE-SIMETH 200-200-20 MG/5ML PO SUSP
30.0000 mL | Freq: Once | ORAL | Status: AC
  Administered 2022-07-03: 30 mL via ORAL
  Filled 2022-07-03: qty 30

## 2022-07-03 MED ORDER — PANTOPRAZOLE SODIUM 20 MG PO TBEC: 20.0000 mg | DELAYED_RELEASE_TABLET | Freq: Every day | ORAL | 0 refills | Status: DC

## 2022-07-03 NOTE — Discharge Instructions (Signed)

## 2022-07-03 NOTE — ED Notes (Signed)
Discharge paperwork given and verbally understood. 

## 2022-07-03 NOTE — ED Provider Notes (Signed)
[facility] Provider Note  CSN: [id number] Arrival date & time: 07/03/22 1504  Chief Complaint(s) Abdominal Pain  HPI Brandy Galloway is a 57 y.o. female with past medical history as below, significant for hypertension, mental retardation, seizures, microcephaly who presents to the ED with complaint of abdominal pain.  Level 5 caveat, mental retardation  History provided primarily by caretaker at bedside/family.  Patient with 3 to 4 days of left-sided flank pain, reduced p.o. intake as patient reports eating makes the discomfort worse.  Had episode of emesis 4 days ago and none since then.  No ongoing nausea.  Was seen urgent care recently advised to go to the ER for evaluation, to the ER last night but the wait time was too long so patient family left.  Returns today.  Patient with intermittent left-sided pain, she is a poor historian but family reports that she will intermittently wince and grab her left side after eating.  Patient with no change in her appetite but has been eating less and drinking less than normal due to the discomfort.  No medications prior to arrival.  No medication or diet changes otherwise.  No change in bowel or bladder function, no fevers or chills, no sick contacts or recent travel.  Denies similar symptoms in the past, denies history of prior abdominal surgery  Past Medical History 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/2011   Mental retardation    Microcephaly ([place])    Seizures ([place])    last one more than 20 yrs ago   Seizures ([place])    years ago; more than 20 years ago   Patient Active Problem List   Diagnosis Date Noted   Jaw pain 12/29/2021   AKI (acute kidney injury) ([place]) 12/29/2021   HTN (hypertension) 12/29/2021   Lumbar back pain 11/22/2020   Spondylolisthesis of lumbar region  11/22/2020   Abnormal EKG 01/11/2020   Lumbar stenosis with neurogenic claudication 03/31/2019   LGSIL on Pap smear of cervix    Postmenopausal vaginal bleeding    Low grade squamous intraepith lesion on cytologic smear cervix (lgsil) 07/12/2011   Home Medication(s) Prior to Admission medications   Medication Sig Start Date End Date Taking? Authorizing Provider  acetaminophen (TYLENOL) 500 MG tablet Take 1 tablet (500 mg total) by mouth every 6 (six) hours as needed. 03/05/22   Ladell Pier, MD  amLODipine (NORVASC) 10 MG tablet Take 1 tablet (10 mg total) by mouth daily. 05/23/22   Ladell Pier, MD  diclofenac Sodium (VOLTAREN) 1 % GEL Apply 2 g topically in the morning and at bedtime. 01/29/22   Ladell Pier, MD  hydrALAZINE (APRESOLINE) 50 MG tablet Take 1 tablet (50 mg total) by mouth 3 (three) times daily. 03/19/22   Ladell Pier, MD  hydrochlorothiazide (HYDRODIURIL) 25 MG tablet Take 1 tablet (25 mg total) by mouth every morning. 05/23/22   Ladell Pier, MD  pregabalin (LYRICA) 50 MG capsule Take 100 mg by mouth 2 (two) times daily. 03/14/22   [provider]  triamcinolone cream (KENALOG) 0.5 % APPLY TOPICALLY TO THE AFFECTED AREA TWICE DAILY AS NEEDED 07/01/22   Ladell Pier, MD  Past Surgical History Past Surgical History:  Procedure Laterality Date   CERVICAL CONE BIOPSY  04/23/2007   CERVICAL CONIZATION W/BX  08/20/2011   Procedure: CONIZATION CERVIX WITH BIOPSY;  Surgeon: Emily Filbert, MD;  Location: [facility];  Service: Gynecology;  Laterality: N/A;  With Endocervical Currettage   COLPOSCOPY  06/21/2011   COLPOSCOPY N/A 05/28/2016   Procedure: COLPOSCOPY;  Surgeon: Mora Bellman, MD;  Location: [facility];  Service: Gynecology;  Laterality: N/A;   DILATION AND CURETTAGE OF UTERUS N/A 05/28/2016   Procedure:  DILATATION AND CURETTAGE;  Surgeon: Mora Bellman, MD;  Location: [facility];  Service: Gynecology;  Laterality: N/A;   LEEP  04/23/2007   LUMBAR LAMINECTOMY/DECOMPRESSION MICRODISCECTOMY N/A 03/31/2019   Procedure: LAMINECTOMY LUMBAR THREE- LUMBAR FOUR, LUMBAR FOUR- LUMBAR FIVE;  Surgeon: Ashok Pall, MD;  Location: [facility];  Service: Neurosurgery;  Laterality: N/A;  LAMINECTOMY LUMBAR THREE- LUMBAR FOUR, LUMBAR FOUR- LUMBAR FIVE   MOUTH SURGERY Left 06/2021   Jaw surgery   RADIOLOGY WITH ANESTHESIA N/A 10/17/2020   Procedure: MRI WITH ANESTHESIA LUMBAR SPINE WITH AND WITHOUT CONTRAST;  Surgeon: Radiologist, Medication, MD;  Location: [facility];  Service: Radiology;  Laterality: N/A;   RADIOLOGY WITH ANESTHESIA N/A 11/06/2021   Procedure: MRI WITH ANESTHESIA OF BRAIN WITH AND WITHOUT CONTRAST, MR ANGIOGRAM OF HEAD WITHOUT CONTRAST;  Surgeon: Radiologist, Medication, MD;  Location: [facility];  Service: Radiology;  Laterality: N/A;   TOOTH EXTRACTION N/A 06/29/2021   Procedure: REMOVAL BILATERAL MANDIBULAR LINGUAL TORI;  Surgeon: Diona Browner, DMD;  Location: [facility];  Service: Oral Surgery;  Laterality: N/A;   Family History Family History  Problem Relation Age of Onset   Diabetes Mother    Diabetes Father     Social History Social History   Tobacco Use   Smoking status: Never   Smokeless tobacco: Never  Vaping Use   Vaping Use: Never used  Substance Use Topics   Alcohol use: Never   Drug use: Never   Allergies Patient has no known allergies.  Review of Systems Review of Systems  Reason unable to perform ROS: mental retardation.  Constitutional:  Negative for chills, diaphoresis and fever.  Respiratory:  Negative for cough and shortness of breath.   Cardiovascular:  Negative for chest pain.  Gastrointestinal:  Positive for abdominal pain and vomiting. Negative for anal bleeding, blood in stool, constipation, diarrhea and nausea.  Genitourinary:  Positive for flank pain. Negative for  pelvic pain and vaginal bleeding.  Musculoskeletal:  Negative for back pain and gait problem.  Neurological:  Negative for seizures and headaches.  Psychiatric/Behavioral:  Negative for agitation and behavioral problems.     Physical Exam Vital Signs  I have reviewed the triage vital signs BP (!) 161/90   Pulse 97   Temp 98.6 F (37 C) (Oral)   Resp 18   LMP  (LMP Unknown)   SpO2 98%  Physical Exam Vitals and nursing note reviewed.  Constitutional:      General: She is not in acute distress.    Appearance: Normal appearance. She is well-developed. She is not ill-appearing or diaphoretic.  HENT:     Head: Normocephalic and atraumatic.     Right Ear: External ear normal.     Left Ear: External ear normal.     Nose: Nose normal.     Mouth/Throat:     Mouth: Mucous membranes are moist.  Eyes:     General: No scleral icterus.       Right  eye: No discharge.        Left eye: No discharge.  Cardiovascular:     Rate and Rhythm: Regular rhythm. Tachycardia present.     Pulses: Normal pulses.     Heart sounds: Normal heart sounds.  Pulmonary:     Effort: Pulmonary effort is normal. No respiratory distress.     Breath sounds: Normal breath sounds.  Abdominal:     General: Abdomen is flat.     Palpations: Abdomen is soft. There is no pulsatile mass.     Tenderness: There is no abdominal tenderness. There is no guarding or rebound.  Musculoskeletal:        General: Normal range of motion.     Cervical back: Normal range of motion.     Right lower leg: No edema.     Left lower leg: No edema.  Skin:    General: Skin is warm and dry.     Capillary Refill: Capillary refill takes less than 2 seconds.  Neurological:     Mental Status: She is alert.  Psychiatric:        Mood and Affect: Mood normal.        Behavior: Behavior normal.     ED Results and Treatments Labs (all labs ordered are listed, but only abnormal results are displayed) Labs Reviewed  COMPREHENSIVE METABOLIC  PANEL - Abnormal; Notable for the following components:      Result Value   Chloride 94 (*)    Glucose, Bld 121 (*)    BUN 28 (*)    Creatinine, Ser 1.21 (*)    Calcium 10.8 (*)    Total Protein 9.3 (*)    Albumin 5.2 (*)    Total Bilirubin 1.5 (*)    GFR, Estimated 52 (*)    All other components within normal limits  URINALYSIS, ROUTINE W REFLEX MICROSCOPIC - Abnormal; Notable for the following components:   Protein, ur 30 (*)    Leukocytes,Ua TRACE (*)    All other components within normal limits  CBC WITH DIFFERENTIAL/PLATELET  LIPASE, BLOOD                                                                                                                          Radiology CT ABDOMEN PELVIS W CONTRAST  Result Date: 07/03/2022 CLINICAL DATA:  Abdominal pain, nonlocalized. Left lower quadrant pain for 3 days. Some nausea. EXAM: CT ABDOMEN AND PELVIS WITH CONTRAST TECHNIQUE: Multidetector CT imaging of the abdomen and pelvis was performed using the standard protocol following bolus administration of intravenous contrast. RADIATION DOSE REDUCTION: This exam was performed according to the departmental dose-optimization program which includes automated exposure control, adjustment of the mA and/or kV according to patient size and/or use of iterative reconstruction technique. CONTRAST:  67m OMNIPAQUE IOHEXOL 300 MG/ML  SOLN COMPARISON:  No comparison studies available. FINDINGS: Lower chest: Tiny hiatal hernia. Hepatobiliary: No suspicious focal abnormality within the liver parenchyma. There is no evidence for gallstones, gallbladder  wall thickening, or pericholecystic fluid. No intrahepatic or extrahepatic biliary dilation. Pancreas: No focal mass lesion. No dilatation of the main duct. No intraparenchymal cyst. No peripancreatic edema. Spleen: No splenomegaly. No focal mass lesion. Adrenals/Urinary Tract: No adrenal nodule or mass. 2 mm nonobstructing stone noted lower pole right kidney. 2 mm  nonobstructing stone seen upper pole left kidney. Cortical scarring noted left kidney. No evidence for hydroureter. The urinary bladder appears normal for the degree of distention. Stomach/Bowel: Stomach is unremarkable. No gastric wall thickening. No evidence of outlet obstruction. Duodenum is normally positioned as is the ligament of Treitz. No small bowel wall thickening. No small bowel dilatation. The terminal ileum is normal. The appendix is not well visualized, but there is no edema or inflammation in the region of the cecum. No gross colonic mass. No colonic wall thickening. Vascular/Lymphatic: There is mild atherosclerotic calcification of the abdominal aorta without aneurysm. There is no gastrohepatic or hepatoduodenal ligament lymphadenopathy. No retroperitoneal or mesenteric lymphadenopathy. No pelvic sidewall lymphadenopathy. Reproductive: Unremarkable. Other: No intraperitoneal free fluid. Musculoskeletal: No worrisome lytic or sclerotic osseous abnormality. Lumbar spine fixation hardware evident. IMPRESSION: 1. No acute findings in the abdomen or pelvis. Specifically, no findings to explain the patient's history of left lower quadrant pain. 2. Tiny bilateral nonobstructing renal stones. 3. Tiny hiatal hernia. 4.  Aortic Atherosclerosis (ICD10-I70.0). Electronically Signed   By: Misty Stanley M.D.   On: 07/03/2022 17:21    Pertinent labs & imaging results that were available during my care of the patient were reviewed by me and considered in my medical decision making (see MDM for details).  Medications Ordered in ED Medications  iohexol (OMNIPAQUE) 300 MG/ML solution 100 mL (85 mLs Intravenous Contrast Given 07/03/22 1642)  sodium chloride 0.9 % bolus 1,000 mL (0 mLs Intravenous Stopped 07/03/22 1849)  famotidine (PEPCID) IVPB 20 mg premix (0 mg Intravenous Stopped 07/03/22 1849)  sodium chloride 0.9 % bolus 500 mL (0 mLs Intravenous Stopped 07/03/22 1849)  alum & mag hydroxide-simeth  (MAALOX/MYLANTA) 200-200-20 MG/5ML suspension 30 mL (30 mLs Oral Given 07/03/22 1800)    And  lidocaine (XYLOCAINE) 2 % viscous mouth solution 15 mL (15 mLs Oral Given 07/03/22 1800)                                                                                                                                     Procedures Procedures  (including critical care time)  Medical Decision Making / ED Course    Medical Decision Making:    Brandy Galloway is a 57 y.o. female with past medical history as below, significant for hypertension, mental retardation, seizures, microcephaly who presents to the ED with complaint of abdominal pain.. The complaint involves an extensive differential diagnosis and also carries with it a high risk of complications and morbidity.  Serious etiology was considered. Ddx includes but is not limited to: Differential diagnosis includes but is not  exclusive to ectopic pregnancy, ovarian cyst, ovarian torsion, acute appendicitis, urinary tract infection, endometriosis, bowel obstruction, hernia, colitis, renal colic, gastroenteritis, volvulus etc.   Complete initial physical exam performed, notably the patient  was no acute distress, resting comfortably, heart rate is elevated.  She has no chest pain or palpitations.    Reviewed and confirmed nursing documentation for past medical history, family history, social history.  Vital signs reviewed.    Clinical Course as of 07/03/22 1930  Wed Jul 03, 2022  1752 Patient is currently asymptomatic.  Concern for dehydration given metabolic panel with elevated creatinine and BUN.  Patient with poor p.o. intake last few days secondary to abdominal discomfort.  Will give IV fluids, GI cocktail, Pepcid.  Reassessed and p.o. challenge.  Get EKG given elevated heart rate but favor tachycardia secondary to dehydration, improved since arrival. [SG]  1753 CT imaging with tiny bilateral nonobstructing kidney stones, no acute abnormalities o/w  [SG]    Clinical Course User Index [SG] Jeanell Sparrow, DO   Patient reassessed, she is feeling much better.  Heart rate has improved.  She is able tolerate p.o. intake without difficulty.  Pain has resolved.  Unclear etiology of her symptoms today, possibly kidney stones affecting her discomfort versus foodborne illness.  Nevertheless she is feeling much better, tolerating p.o. intake.  Requesting go home.  Family follow-up with PCP in the next few days for recheck of labs.  Encouraged rehydration  The patient's overall condition has improved, the patient presents with abdominal pain without signs of peritonitis, or other life-threatening serious etiology. The patient understands that at this time there is no evidence for a more malignant underlying process, but the patient also understands that early in the process of an illness, an emergency department workup can be falsely reassuring. Detailed discussions were had with the patient regarding current findings, and need for close f/u with PCP or on call doctor. The patient appears stable for discharge and has been instructed to return immediately if the symptoms worsen in any way, or if not improved for re-evaluation. Patient verbalized understanding and is in agreement with current care plan.  All questions answered prior to discharge.       Additional history obtained: -Additional history obtained from family -External records from outside source obtained and reviewed including: Chart review including previous notes, labs, imaging, consultation notes including prior urgent care notes, prior labs and imaging, home medications   Lab Tests: -I ordered, reviewed, and interpreted labs.   The pertinent results include:   Labs Reviewed  COMPREHENSIVE METABOLIC PANEL - Abnormal; Notable for the following components:      Result Value   Chloride 94 (*)    Glucose, Bld 121 (*)    BUN 28 (*)    Creatinine, Ser 1.21 (*)    Calcium 10.8 (*)     Total Protein 9.3 (*)    Albumin 5.2 (*)    Total Bilirubin 1.5 (*)    GFR, Estimated 52 (*)    All other components within normal limits  URINALYSIS, ROUTINE W REFLEX MICROSCOPIC - Abnormal; Notable for the following components:   Protein, ur 30 (*)    Leukocytes,Ua TRACE (*)    All other components within normal limits  CBC WITH DIFFERENTIAL/PLATELET  LIPASE, BLOOD    Notable for elev cr  EKG   EKG Interpretation  Date/Time:    Ventricular Rate:    PR Interval:    QRS Duration:   QT Interval:  QTC Calculation:   R Axis:     Text Interpretation:           Imaging Studies ordered: I ordered imaging studies including CTAP I independently visualized the following imaging with scope of interpretation limited to determining acute life threatening conditions related to emergency care: as above I independently visualized and interpreted imaging. I agree with the radiologist interpretation   Medicines ordered and prescription drug management: Meds ordered this encounter  Medications   iohexol (OMNIPAQUE) 300 MG/ML solution 100 mL   sodium chloride 0.9 % bolus 1,000 mL   famotidine (PEPCID) IVPB 20 mg premix   sodium chloride 0.9 % bolus 500 mL   AND Linked Order Group    alum & mag hydroxide-simeth (MAALOX/MYLANTA) 200-200-20 MG/5ML suspension 30 mL    lidocaine (XYLOCAINE) 2 % viscous mouth solution 15 mL    -I have reviewed the patients home medicines and have made adjustments as needed   Consultations Obtained: na   Cardiac Monitoring: The patient was maintained on a cardiac monitor.  I personally viewed and interpreted the cardiac monitored which showed an underlying rhythm of: sinus tachy >> nsr  Social Determinants of Health:  Diagnosis or treatment significantly limited by social determinants of health: MR   Reevaluation: After the interventions noted above, I reevaluated the patient and found that they have resolved  Co morbidities that complicate  the patient evaluation  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/2011   Mental retardation    Microcephaly ([place])    Seizures ([place])    last one more than 20 yrs ago   Seizures ([place])    years ago; more than 20 years ago      Dispostion: Disposition decision including need for hospitalization was considered, and patient discharged from emergency department.    Final Clinical Impression(s) / ED Diagnoses Final diagnoses:  None     This chart was dictated using voice recognition software.  Despite best efforts to proofread,  errors can occur which can change the documentation meaning.    Jeanell Sparrow, DO 07/03/22 1930

## 2022-07-03 NOTE — ED Triage Notes (Signed)
Pt is here for LLQ pain for 3 days.  Pt was seen at [facility] but LWBS.

## 2022-07-03 NOTE — Telephone Encounter (Signed)
Message from Sharene Skeans sent at 07/03/2022 10:00 AM EDT  Summary: UC and ER issue   Pt went to UC and then was sent to ER and waited until 10pm and still had not been seen / they left and were not seen / he wanted to let Dr. Wynetta Emery know/ he asked about going to [facility] he may take her there / please advise         Chief Complaint: wait was long pt LWOBS- Pt's husband is taking her to ED at [facility]: Patient denies vomiting Disposition: '[x]'$ ED /'[]'$ Urgent Care (no appt availability in office) / '[]'$ Appointment(In office/virtual)/ '[]'$  [facility]/ '[]'$ Home Care/ '[]'$ Refused Recommended Disposition /'[]'$ [facility]/ '[]'$  Follow-up with PCP Additional Notes: left side pain the comes and goes  Reason for Disposition  Health Information question, no triage required and triager able to answer question  Answer Assessment - Initial Assessment Questions 1. REASON FOR CALL or QUESTION: "What is your reason for calling today?" or "How can I best help you?" or "What question do you have that I can help answer?"     *No Answer*Message from Sharene Skeans sent at 07/03/2022 10:00 AM EDT  Summary: UC and ER issue   Pt went to UC and then was sent to ER and waited until 10pm and still had not been seen / they left and were not seen / he wanted to let Dr. Wynetta Emery know/ he asked about going to [facility] /thinks he may take her there / please advise  Protocols used: Information Only Call - No Triage-A-AH

## 2022-07-03 NOTE — ED Notes (Signed)
PO challenge passed 

## 2022-07-04 LAB — POCT URINALYSIS DIPSTICK, ED / UC
Bilirubin Urine: NEGATIVE
Glucose, UA: 500 mg/dL — AB
Ketones, ur: NEGATIVE mg/dL
Leukocytes,Ua: NEGATIVE
Nitrite: NEGATIVE
Protein, ur: NEGATIVE mg/dL
Specific Gravity, Urine: 1.015 (ref 1.005–1.030)
Urobilinogen, UA: 1 mg/dL (ref 0.0–1.0)
pH: 5.5 (ref 5.0–8.0)

## 2022-07-08 DIAGNOSIS — Z79899 Other long term (current) drug therapy: Secondary | ICD-10-CM | POA: Diagnosis not present

## 2022-07-08 DIAGNOSIS — Z6826 Body mass index (BMI) 26.0-26.9, adult: Secondary | ICD-10-CM | POA: Diagnosis not present

## 2022-07-08 DIAGNOSIS — I10 Essential (primary) hypertension: Secondary | ICD-10-CM | POA: Diagnosis not present

## 2022-07-08 DIAGNOSIS — M79673 Pain in unspecified foot: Secondary | ICD-10-CM | POA: Diagnosis not present

## 2022-07-08 DIAGNOSIS — R519 Headache, unspecified: Secondary | ICD-10-CM | POA: Diagnosis not present

## 2022-07-13 ENCOUNTER — Other Ambulatory Visit: Payer: Self-pay | Admitting: Internal Medicine

## 2022-07-15 NOTE — Telephone Encounter (Signed)
Requested medication (s) are due for refill today: yes  Requested medication (s) are on the active medication list: yes  Last refill:  07/01/22  Future visit scheduled: yes  Notes to clinic:  Unable to refill per protocol, cannot delegate.      Requested Prescriptions  Pending Prescriptions Disp Refills   triamcinolone cream (KENALOG) 0.5 % [Pharmacy Med Name: TRIAMCINOLONE 0.5% CREAM 15GM] 30 g 0    Sig: APPLY TOPICALLY TO THE AFFECTED AREA TWICE DAILY AS NEEDED     Not Delegated - Dermatology:  Corticosteroids Failed - 07/13/2022  8:16 AM      Failed - This refill cannot be delegated      Passed - Valid encounter within last 12 months    Recent Outpatient Visits           2 weeks ago Essential hypertension   [place], RPH-CPP   1 month ago Essential hypertension   [place], Jarome Matin, RPH-CPP   3 months ago Essential hypertension   [place], MD   4 months ago Essential hypertension   [place], Jarome Matin, RPH-CPP   5 months ago Establishing care with new doctor, encounter for   [place], MD       Future Appointments             In 3 days Ladell Pier, MD [place]   In 3 weeks Daisy Blossom, Jarome Matin, [place]

## 2022-07-17 ENCOUNTER — Other Ambulatory Visit: Payer: Self-pay | Admitting: *Deleted

## 2022-07-17 ENCOUNTER — Ambulatory Visit: Payer: Self-pay | Admitting: *Deleted

## 2022-07-17 DIAGNOSIS — M79605 Pain in left leg: Secondary | ICD-10-CM

## 2022-07-17 MED ORDER — DICLOFENAC SODIUM 1 % EX GEL: 2.0000 g | Freq: Two times a day (BID) | CUTANEOUS | 1 refills | Status: DC

## 2022-07-17 NOTE — Telephone Encounter (Signed)
Requesting RF of Diclofenac gel

## 2022-07-17 NOTE — Telephone Encounter (Signed)
Requested Prescriptions  Pending Prescriptions Disp Refills   diclofenac Sodium (VOLTAREN) 1 % GEL 100 g 1    Sig: Apply 2 g topically in the morning and at bedtime.     Analgesics:  Topicals Failed - 07/17/2022  8:28 AM      Failed - Manual Review: Labs are only required if the patient has taken medication for more than 8 weeks.      Failed - Cr in normal range and within 360 days    Creatinine, Ser  Date Value Ref Range Status  07/03/2022 1.21 (H) 0.44 - 1.00 mg/dL Final         Passed - PLT in normal range and within 360 days    Platelets  Date Value Ref Range Status  07/03/2022 336 150 - 400 K/uL Final         Passed - HGB in normal range and within 360 days    Hemoglobin  Date Value Ref Range Status  07/03/2022 13.9 12.0 - 15.0 g/dL Final         Passed - HCT in normal range and within 360 days    HCT  Date Value Ref Range Status  07/03/2022 41.3 36.0 - 46.0 % Final         Passed - eGFR is 30 or above and within 360 days    GFR calc Af Amer  Date Value Ref Range Status  03/29/2019 >60 >60 mL/min Final   GFR, Estimated  Date Value Ref Range Status  07/03/2022 52 (L) >60 mL/min Final    Comment:    (NOTE) Calculated using the CKD-EPI Creatinine Equation (2021)    eGFR  Date Value Ref Range Status  05/23/2022 59 (L) >59 mL/min/1.73 Final         Passed - Patient is not pregnant      Passed - Valid encounter within last 12 months    Recent Outpatient Visits           2 weeks ago Essential hypertension   [place], Jarome Matin, RPH-CPP   1 month ago Essential hypertension   [place], Jarome Matin, RPH-CPP   4 months ago Essential hypertension   [place], MD   4 months ago Essential hypertension   [place], Jarome Matin, RPH-CPP   5 months ago Establishing  care with new doctor, encounter for   [place], MD       Future Appointments             Tomorrow Ladell Pier, MD [place]   In 3 weeks Daisy Blossom, Jarome Matin, [place]

## 2022-07-17 NOTE — Telephone Encounter (Signed)
Summary: pain in right leg lower area   Pt caretaker stated pt is experiencing pain in her right leg lower area. Stated has been complaining about it often.  Pt has an appointment with PCP tomorrow.  Seeking clinical advice.     Chief Complaint: ankle/joint pain- request for RF of Diclofenac- will send separate request in Rx que Symptoms: pain in joint of ankle-top of R foot Frequency: started back after got better Pertinent Negatives: Patient denies chest pain, back pain, breathing difficulty, swelling, rash, fever, numbness, weakness Disposition: [] ED /[] Urgent Care (no appt availability in office) / [x] Appointment(In office/virtual)/ []  [facility]/ [] Home Care/ [] Refused Recommended Disposition /[] [place] [facility]/ []  Follow-up with PCP Additional Notes: Patient has appointment tomorrow- her caregiver, [name] has been advised call back with changes- he is requesting refill of Diclofenac for her ankle.  Reason for Disposition  [1] MILD pain (e.g., does not interfere with normal activities) AND [2] present > 7 days  Answer Assessment - Initial Assessment Questions 1. ONSET: "When did the pain start?"      Right leg- patient has had pain before and it got better with Diclofenac 2. LOCATION: "Where is the pain located?"      Ankle joint pain- top of foot 3. PAIN: "How bad is the pain?"    (Scale 1-10; or mild, moderate, severe)   -  MILD (1-3): doesn't interfere with normal activities    -  MODERATE (4-7): interferes with normal activities (e.g., work or school) or awakens from sleep, limping    -  SEVERE (8-10): excruciating pain, unable to do any normal activities, unable to walk     mild 4. WORK OR EXERCISE: "Has there been any recent work or exercise that involved this part of the body?"      no 5. CAUSE: "What do you think is causing the leg pain?"     Joint pain 6. OTHER SYMPTOMS: "Do you have any other symptoms?" (e.g., chest pain, back pain, breathing  difficulty, swelling, rash, fever, numbness, weakness)     no  Protocols used: Leg Pain-A-AH

## 2022-07-18 ENCOUNTER — Ambulatory Visit: Payer: Medicaid Other | Attending: Internal Medicine | Admitting: Internal Medicine

## 2022-07-18 ENCOUNTER — Encounter: Payer: Self-pay | Admitting: Internal Medicine

## 2022-07-18 VITALS — BP 144/72 | HR 89 | Temp 98.1°F | Ht 68.0 in | Wt 161.0 lb

## 2022-07-18 DIAGNOSIS — M25571 Pain in right ankle and joints of right foot: Secondary | ICD-10-CM

## 2022-07-18 DIAGNOSIS — Z1211 Encounter for screening for malignant neoplasm of colon: Secondary | ICD-10-CM | POA: Diagnosis not present

## 2022-07-18 DIAGNOSIS — I10 Essential (primary) hypertension: Secondary | ICD-10-CM

## 2022-07-18 DIAGNOSIS — Z1231 Encounter for screening mammogram for malignant neoplasm of breast: Secondary | ICD-10-CM

## 2022-07-18 DIAGNOSIS — N1831 Chronic kidney disease, stage 3a: Secondary | ICD-10-CM | POA: Diagnosis not present

## 2022-07-18 MED ORDER — DICLOFENAC SODIUM 1 % EX GEL: 2.0000 g | Freq: Two times a day (BID) | CUTANEOUS | 1 refills | Status: DC

## 2022-07-18 MED ORDER — HYDRALAZINE HCL 50 MG PO TABS: 75.0000 mg | ORAL_TABLET | Freq: Three times a day (TID) | ORAL | 6 refills | Status: DC

## 2022-07-18 NOTE — Progress Notes (Signed)
Patient ID: BURKLEIGH HOR, female    DOB: 02-Jul-1965  MRN: [id number]  CC: Hypertension (HTN f/u. Med refill./Pain on L leg X2-3 mo /Already recived flu vax. )   Subjective: Brandy Galloway is a 57 y.o. female who presents for f/u visit.  Uncle is with her and gives hx. Pt nonverbal Her concerns today include:  Patient with history of HTN, abnormal Pap smear with history of LGSIL, developmental delay since birth with microcephaly , seizure (only one time many yrs ago; not on med), anxiety, lumbar stenosis s/p laminectomy (Dr. Cyndy Freeze), chronic left-sided facial pain   Uncle reports pt has been complaining of pain in RT ankle x 4 wks No known injury.  He had noted what looked like a pimple on the midfoot.  He is kept a Band-Aid over it.  It is healed up. Reports pts wakes up crying at nights from the pain Request refill on Voltaren gel  Chronic jaw pain: Saw oral surgeon Kandis Cocking at [facility] 05/2022.  Told from x-rays, previous procedure done by Dr. Hoyt Koch was good.  Barbaraann Rondo is satisfied with that.  He is now moving forward and trying to get dentures made for her. Followed by [facility] for pain management.  She was on Oxycodone/Tylenol 10 mg TID but reduced to 5 mg because the higher dose was causing vomiting.  Looks like last rxn was for Hydrocodone/Tylenol 5/325 md.  Taking about once a day more so for pain in RT ankle.  Has not complained about pain in mouth much any more.    HTN:  On Norvasc 10 mg, Hydralazine 50 mg TID and HCTZ 25 mg.  Compliant and took already today.  Uncle handles her medications.  He did not bring medications with him today. Kidney function has not been 100%.  Recent GFR's have ranged from 58-56.  Last BMP showed calcium of 10.8 with creatinine of 1.21 and GFR of 52.  She is not on any oral NSAIDs.    HM:  MMG ordered on last visit.  Uncle stated that they did not call him back to schedule.  Uncle reports sending back the Cologuard test within the last mth  Patient  Active Problem List   Diagnosis Date Noted   Jaw pain 12/29/2021   AKI (acute kidney injury) ([place]) 12/29/2021   HTN (hypertension) 12/29/2021   Lumbar back pain 11/22/2020   Spondylolisthesis of lumbar region 11/22/2020   Abnormal EKG 01/11/2020   Lumbar stenosis with neurogenic claudication 03/31/2019   LGSIL on Pap smear of cervix    Postmenopausal vaginal bleeding    Low grade squamous intraepith lesion on cytologic smear cervix (lgsil) 07/12/2011     Current Outpatient Medications on File Prior to Visit  Medication Sig Dispense Refill   acetaminophen (TYLENOL) 500 MG tablet Take 1 tablet (500 mg total) by mouth every 6 (six) hours as needed. 60 tablet 1   amLODipine (NORVASC) 10 MG tablet Take 1 tablet (10 mg total) by mouth daily. 90 tablet 1   hydrochlorothiazide (HYDRODIURIL) 25 MG tablet Take 1 tablet (25 mg total) by mouth every morning. 90 tablet 1   HYDROcodone-acetaminophen (NORCO/VICODIN) 5-325 MG tablet Take 1 tablet by mouth 3 (three) times daily.     ondansetron (ZOFRAN) 4 MG tablet Take 1 tablet (4 mg total) by mouth every 4 (four) hours as needed for nausea or vomiting. 12 tablet 0   pregabalin (LYRICA) 50 MG capsule Take 100 mg by mouth 2 (two) times daily.  triamcinolone cream (KENALOG) 0.5 % APPLY TOPICALLY TO THE AFFECTED AREA TWICE DAILY AS NEEDED 30 g 0   pantoprazole (PROTONIX) 20 MG tablet Take 1 tablet (20 mg total) by mouth daily for 14 days. 14 tablet 0   sucralfate (CARAFATE) 1 g tablet Take 1 tablet (1 g total) by mouth 3 (three) times daily with meals for 7 days. 21 tablet 0   No current facility-administered medications on file prior to visit.    No Known Allergies  Social History   Socioeconomic History   Marital status: Single    Spouse name: Not on file   Number of children: 0   Years of education: Not on file   Highest education level: Not on file  Occupational History   Not on file  Tobacco Use   Smoking status: Never   Smokeless  tobacco: Never  Vaping Use   Vaping Use: Never used  Substance and Sexual Activity   Alcohol use: Never   Drug use: Never   Sexual activity: Never    Birth control/protection: None  Other Topics Concern   Not on file  Social History Narrative   08/01/21 Lives with her uncle who is her legal guardian, [name].         Left Handed       Social Determinants of Health   Financial Resource Strain: Low Risk  (06/28/2022)   Overall Financial Resource Strain (CARDIA)    Difficulty of Paying Living Expenses: Not hard at all  Food Insecurity: No Food Insecurity (06/28/2022)   Hunger Vital Sign    Worried About Running Out of Food in the Last Year: Never true    Ran Out of Food in the Last Year: Never true  Transportation Needs: No Transportation Needs (06/28/2022)   PRAPARE - Hydrologist (Medical): No    Lack of Transportation (Non-Medical): No  Physical Activity: Inactive (06/28/2022)   Exercise Vital Sign    Days of Exercise per Week: 0 days    Minutes of Exercise per Session: 0 min  Stress: No Stress Concern Present (06/28/2022)   [place]    Feeling of Stress : Not at all  Social Connections: Socially Isolated (06/28/2022)   Social Connection and Isolation Panel [NHANES]    Frequency of Communication with Friends and Family: Never    Frequency of Social Gatherings with Friends and Family: More than three times a week    Attends Religious Services: Never    Marine scientist or Organizations: Not on file    Attends Archivist Meetings: Never    Marital Status: Never married  Intimate Partner Violence: Not At Risk (06/28/2022)   Humiliation, Afraid, Rape, and Kick questionnaire    Fear of Current or Ex-Partner: No    Emotionally Abused: No    Physically Abused: No    Sexually Abused: No    Family History  Problem Relation Age of Onset   Diabetes Mother    Diabetes  Father     Past Surgical History:  Procedure Laterality Date   CERVICAL CONE BIOPSY  04/23/2007   CERVICAL CONIZATION W/BX  08/20/2011   Procedure: CONIZATION CERVIX WITH BIOPSY;  Surgeon: Emily Filbert, MD;  Location: [facility];  Service: Gynecology;  Laterality: N/A;  With Endocervical Currettage   COLPOSCOPY  06/21/2011   COLPOSCOPY N/A 05/28/2016   Procedure: COLPOSCOPY;  Surgeon: Mora Bellman, MD;  Location: [facility];  Service: Gynecology;  Laterality: N/A;   DILATION AND CURETTAGE OF UTERUS N/A 05/28/2016   Procedure: DILATATION AND CURETTAGE;  Surgeon: Mora Bellman, MD;  Location: [facility];  Service: Gynecology;  Laterality: N/A;   LEEP  04/23/2007   LUMBAR LAMINECTOMY/DECOMPRESSION MICRODISCECTOMY N/A 03/31/2019   Procedure: LAMINECTOMY LUMBAR THREE- LUMBAR FOUR, LUMBAR FOUR- LUMBAR FIVE;  Surgeon: Ashok Pall, MD;  Location: [facility];  Service: Neurosurgery;  Laterality: N/A;  LAMINECTOMY LUMBAR THREE- LUMBAR FOUR, LUMBAR FOUR- LUMBAR FIVE   MOUTH SURGERY Left 06/2021   Jaw surgery   RADIOLOGY WITH ANESTHESIA N/A 10/17/2020   Procedure: MRI WITH ANESTHESIA LUMBAR SPINE WITH AND WITHOUT CONTRAST;  Surgeon: Radiologist, Medication, MD;  Location: [facility];  Service: Radiology;  Laterality: N/A;   RADIOLOGY WITH ANESTHESIA N/A 11/06/2021   Procedure: MRI WITH ANESTHESIA OF BRAIN WITH AND WITHOUT CONTRAST, MR ANGIOGRAM OF HEAD WITHOUT CONTRAST;  Surgeon: Radiologist, Medication, MD;  Location: [facility];  Service: Radiology;  Laterality: N/A;   TOOTH EXTRACTION N/A 06/29/2021   Procedure: REMOVAL BILATERAL MANDIBULAR LINGUAL TORI;  Surgeon: Diona Browner, DMD;  Location: [facility];  Service: Oral Surgery;  Laterality: N/A;    ROS: Review of Systems Negative except as stated above  PHYSICAL EXAM: BP (!) 144/72 (BP Location: Left Arm, Patient Position: Sitting, Cuff Size: Normal)   Pulse 89   Temp 98.1 F (36.7 C) (Oral)   Ht 5\' 8"  (1.727 m)   Wt 161 lb (73 kg)   LMP  (LMP Unknown)   SpO2 98%    BMI 24.48 kg/m   Physical Exam BP 150/77 General appearance -middle-age African-American female in NAD Mental status -patient is able to follow simple commands Chest - clear to auscultation, no wheezes, rales or rhonchi, symmetric air entry Heart - normal rate, regular rhythm, normal S1, S2, no murmurs, rubs, clicks or gallops Musculoskeletal - RT ankle: Very slight soft tissue edema of the mid foot being the anterior ankle.  No point tenderness.  No fluctuance appreciated.  She has good flexion and extension.      Latest Ref Rng & Units 07/03/2022    3:42 PM 07/02/2022    8:47 PM 05/23/2022    4:01 PM  CMP  Glucose 70 - 99 mg/dL 121  127  143   BUN 6 - 20 mg/dL 28  23  14    Creatinine 0.44 - 1.00 mg/dL 1.21  1.30  1.09   Sodium 135 - 145 mmol/L 136  136  142   Potassium 3.5 - 5.1 mmol/L 3.5  3.7  3.6   Chloride 98 - 111 mmol/L 94  96  103   CO2 22 - 32 mmol/L 30  26  25    Calcium 8.9 - 10.3 mg/dL 10.8  10.2  9.8   Total Protein 6.5 - 8.1 g/dL 9.3  9.0  7.6   Total Bilirubin 0.3 - 1.2 mg/dL 1.5  1.3  0.5   Alkaline Phos 38 - 126 U/L 102  117  123   AST 15 - 41 U/L 31  22  15    ALT 0 - 44 U/L 36  29  11    Lipid Panel     Component Value Date/Time   CHOL 193 05/23/2022 1601   TRIG 114 05/23/2022 1601   HDL 53 05/23/2022 1601   CHOLHDL 3.6 05/23/2022 1601   LDLCALC 120 (H) 05/23/2022 1601    CBC    Component Value Date/Time   WBC 6.7 07/03/2022 1542   RBC  4.74 07/03/2022 1542   HGB 13.9 07/03/2022 1542   HCT 41.3 07/03/2022 1542   PLT 336 07/03/2022 1542   MCV 87.1 07/03/2022 1542   MCH 29.3 07/03/2022 1542   MCHC 33.7 07/03/2022 1542   RDW 13.3 07/03/2022 1542   LYMPHSABS 2.2 07/03/2022 1542   MONOABS 0.5 07/03/2022 1542   EOSABS 0.2 07/03/2022 1542   BASOSABS 0.1 07/03/2022 1542    ASSESSMENT AND PLAN: 1. Acute right ankle pain Questionable etiology.  Will get some plain x-rays and refer to podiatry. - DG Ankle Complete Right; Future - Ambulatory referral  to Podiatry - diclofenac Sodium (VOLTAREN) 1 % GEL; Apply 2 g topically in the morning and at bedtime.  Dispense: 100 g; Refill: 1  2. Essential hypertension Not at goal.  Increase hydralazine to 75 mg 3 times a day.  Continue current dose of HCTZ and Norvasc. - hydrALAZINE (APRESOLINE) 50 MG tablet; Take 1.5 tablets (75 mg total) by mouth 3 (three) times daily.  Dispense: 135 tablet; Refill: 6  3. Stage 3a chronic kidney disease (HCC) Avoid oral NSAIDs.  Increase fluid intake.  4. Hypercalcemia May be due to HCTZ.  However we will check some other baseline blood test - PTH, Intact and Calcium - PTH-Related Peptide - TSH+T4F+T3Free - VITAMIN D 25 Hydroxy (Vit-D Deficiency, Fractures)  5. Encounter for screening mammogram for malignant neoplasm of breast I will resubmit referral for mammogram. - MM Digital Screening; Future  6. Screening for colon cancer Message sent to my CMA to call [facility] to see whether they had received her specimen    Patient was given the opportunity to ask questions.  Patient verbalized understanding of the plan and was able to repeat key elements of the plan.   This documentation was completed using Radio producer.  Any transcriptional errors are unintentional.  Orders Placed This Encounter  Procedures   DG Ankle Complete Right   MM Digital Screening   PTH, Intact and Calcium   PTH-Related Peptide   TSH+T4F+T3Free   VITAMIN D 25 Hydroxy (Vit-D Deficiency, Fractures)   Ambulatory referral to Podiatry     Requested Prescriptions   Signed Prescriptions Disp Refills   diclofenac Sodium (VOLTAREN) 1 % GEL 100 g 1    Sig: Apply 2 g topically in the morning and at bedtime.   hydrALAZINE (APRESOLINE) 50 MG tablet 135 tablet 6    Sig: Take 1.5 tablets (75 mg total) by mouth 3 (three) times daily.    Return in about 6 months (around 01/18/2023).  Karle Plumber, MD, FACP

## 2022-07-18 NOTE — Patient Instructions (Signed)
Please go to [facility] at [address] to have x-rays done

## 2022-07-19 ENCOUNTER — Other Ambulatory Visit: Payer: Self-pay

## 2022-07-19 ENCOUNTER — Telehealth: Payer: Self-pay | Admitting: Internal Medicine

## 2022-07-21 ENCOUNTER — Other Ambulatory Visit: Payer: Self-pay | Admitting: Internal Medicine

## 2022-07-21 MED ORDER — VITAMIN D2 10 MCG (400 UNIT) PO TABS: ORAL_TABLET | ORAL | 1 refills | Status: DC

## 2022-07-22 ENCOUNTER — Telehealth: Payer: Self-pay | Admitting: *Deleted

## 2022-07-22 ENCOUNTER — Other Ambulatory Visit: Payer: Self-pay | Admitting: *Deleted

## 2022-07-22 NOTE — Telephone Encounter (Signed)
Pt name and DOB verified. Patient uncle is aware of results and result note per Dr. Wynetta Emery. States he has been giving patient  Vitamin 50,000 weekly prescribed by Dr. Posey Pronto. Advised to hold on the Rx sent by Dr. Wynetta Emery. Medication was added to medication list. Sending message to PCP to advise otherwise.

## 2022-07-22 NOTE — Telephone Encounter (Signed)
-----   Message from Ladell Pier, MD sent at 07/21/2022  3:33 PM EDT ----- Let pt's uncle know that her vitamin D level is low.  I sent a prescription to her pharmacy for vitamin D supplement.  If it is not covered by her insurance, he can purchase over the counter Vitamin D 400 IU daily.

## 2022-07-23 ENCOUNTER — Other Ambulatory Visit: Payer: Self-pay | Admitting: Internal Medicine

## 2022-07-23 ENCOUNTER — Ambulatory Visit
Admission: RE | Admit: 2022-07-23 | Discharge: 2022-07-23 | Disposition: A | Discharge status: Home / Self Care | Payer: Medicaid Other | Source: Ambulatory Visit | Attending: Internal Medicine | Admitting: Internal Medicine

## 2022-07-23 DIAGNOSIS — Z1231 Encounter for screening mammogram for malignant neoplasm of breast: Secondary | ICD-10-CM | POA: Diagnosis not present

## 2022-07-23 DIAGNOSIS — Z1211 Encounter for screening for malignant neoplasm of colon: Secondary | ICD-10-CM

## 2022-07-23 DIAGNOSIS — M25571 Pain in right ankle and joints of right foot: Secondary | ICD-10-CM

## 2022-07-23 DIAGNOSIS — N1831 Chronic kidney disease, stage 3a: Secondary | ICD-10-CM

## 2022-07-23 DIAGNOSIS — I10 Essential (primary) hypertension: Secondary | ICD-10-CM

## 2022-07-24 ENCOUNTER — Telehealth: Payer: Self-pay

## 2022-07-24 ENCOUNTER — Ambulatory Visit: Payer: Self-pay

## 2022-07-24 NOTE — Telephone Encounter (Signed)
Open in error

## 2022-07-24 NOTE — Telephone Encounter (Signed)
Pt's uncle called back following up on calls from earlier. Says he has already taken the patient to the ED and will not take her back. Says the patient is pointing to the pain in her legs. Says he plans to follow up with PCP in the am.

## 2022-07-24 NOTE — Telephone Encounter (Signed)
  Chief Complaint: foot pain  Symptoms: R foot and pain Frequency: 1 month or more  Pertinent Negatives: NA Disposition: [] ED /[] Urgent Care (no appt availability in office) / [] Appointment(In office/virtual)/ []  [facility]/ [] Home Care/ [] Refused Recommended Disposition /[] [place] [facility]/ [x]  Follow-up with PCP Additional Notes: pt's uncle calling asking for refill for Hydrocodone for foot pain d/t pt still c/o R foot and ankle pain and is out of pain medicine. Hydrocodone was prescribed by ED provider. Pt just had OV on 07/18/22. Uncle is planning on taking pt to have XR done on Monday. Advised I would send message back for refill and will have CMA FU with him regarding refill d/t not delegated. He verbalized understanding.   Summary: Need more Hydrocodone 5-325   [name] called stated pt took her last HYDROcodone-acetaminophen (NORCO/VICODIN) 5-325 MG tablet that was given to her by the ED Dept on last Friday and she is still complaining about her feet still hurting. She needs the medication and uncle stated if the nurse would call him back at [phone] he would give more information.         Reason for Disposition  [1] MODERATE pain (e.g., interferes with normal activities, limping) AND [2] present > 3 days  Answer Assessment - Initial Assessment Questions 1. ONSET: "When did the pain start?"      1 month ongoing 2. LOCATION: "Where is the pain located?"      R foot and ankle 3. PAIN: "How bad is the pain?"    (Scale 1-10; or mild, moderate, severe)  - MILD (1-3): doesn't interfere with normal activities.   - MODERATE (4-7): interferes with normal activities (e.g., work or school) or awakens from sleep, limping.   - SEVERE (8-10): excruciating pain, unable to do any normal activities, unable to walk.      Pt nonverbal  6. OTHER SYMPTOMS: "Do you have any other symptoms?" (e.g., leg pain, rash, fever, numbness)  Protocols used: Foot Pain-A-AH

## 2022-07-24 NOTE — Telephone Encounter (Signed)
Routing to PCP

## 2022-07-24 NOTE — Telephone Encounter (Signed)
The uncle called back in checking on the status of a previous request for pain meds. He states the patient cries ever so often do to pain in her leg/ankle.

## 2022-07-24 NOTE — Telephone Encounter (Signed)
Pt uncle states his niece is needing medication for pain her prescription was supposed to

## 2022-07-25 ENCOUNTER — Telehealth: Payer: Self-pay | Admitting: Internal Medicine

## 2022-07-25 NOTE — Telephone Encounter (Signed)
Copied from [place] [phone]. Topic: General - Other >> Jul 25, 2022  4:07 PM Ludger Nutting wrote: Patient's Uncle would like a call back from provider or nurse.

## 2022-07-25 NOTE — Telephone Encounter (Signed)
Patient advised to contact pain specialist at [facility] for refills on any pain medication.  Advise that pain specialist should be whom should be called for all future refills. Patient voiced understanding of all discussed .

## 2022-07-29 NOTE — Telephone Encounter (Signed)
Patient uncle was called on 07/26/2022. Please see previous telephone encounter. All questions resolved. Patient's uncle [name] (leal guardian) and authorized to receive information on DPR, was called on 07/29/2022 and given lab results. No further questions at this time.

## 2022-07-30 ENCOUNTER — Ambulatory Visit
Admission: RE | Admit: 2022-07-30 | Discharge: 2022-07-30 | Disposition: A | Discharge status: Home / Self Care | Payer: Medicaid Other | Source: Ambulatory Visit | Attending: Internal Medicine | Admitting: Internal Medicine

## 2022-07-30 DIAGNOSIS — M25571 Pain in right ankle and joints of right foot: Secondary | ICD-10-CM | POA: Diagnosis not present

## 2022-07-30 LAB — TSH+T4F+T3FREE
Free T4: 1.23 ng/dL (ref 0.82–1.77)
T3, Free: 2.9 pg/mL (ref 2.0–4.4)
TSH: 1.56 u[IU]/mL (ref 0.450–4.500)

## 2022-07-30 LAB — PTH, INTACT AND CALCIUM
Calcium: 9.9 mg/dL (ref 8.7–10.2)
PTH: 31 pg/mL (ref 15–65)

## 2022-07-30 LAB — PTH-RELATED PEPTIDE: PTH-related peptide: <2 pmol/L

## 2022-07-30 LAB — VITAMIN D 25 HYDROXY (VIT D DEFICIENCY, FRACTURES): Vit D, 25-Hydroxy: 19.1 ng/mL — ABNORMAL LOW (ref 30.0–100.0)

## 2022-08-02 ENCOUNTER — Telehealth: Payer: Self-pay | Admitting: Internal Medicine

## 2022-08-02 NOTE — Telephone Encounter (Signed)
-----   Message from Johna Roles, [place] sent at 08/02/2022  2:15 PM EDT ----- I called [facility] and spoke with Sundra Aland and she stated that the kit was sent to them in December but when the patient mailed it back it got lost in transit. Dahlia Client stated that it does not look like the patient did this incorrectly but that it was simply lost. The representative stated that she can send a kit to them again free of charge since they are not at fault. I did go ahead and authorize. I will call the uncle to let him know that they will receive a kit again.   ----- Message ----- From: Marcine Matar, MD Sent: 07/18/2022   1:36 PM EDT To: Johna Roles, CMA  Please call [facility] and inquire whether they have received Cologuard specimen on this patient.  Uncle reports that the scented in about 1 month ago but I have not received the results.

## 2022-08-05 ENCOUNTER — Other Ambulatory Visit: Payer: Self-pay | Admitting: Internal Medicine

## 2022-08-05 DIAGNOSIS — M25571 Pain in right ankle and joints of right foot: Secondary | ICD-10-CM

## 2022-08-05 NOTE — Telephone Encounter (Signed)
Medication Refill - Medication: diclofenac Sodium (VOLTAREN) 1 % GEL   Johnson,[name] (Uncle) stated this helps the pt and he has already gotten her an appointment with Podiatry.  Has the patient contacted their pharmacy? No. No, more refills.   (Agent: If no, request that the patient contact the pharmacy for the refill. If patient does not wish to contact the pharmacy document the reason why and proceed with request.)   Preferred Pharmacy (with phone number or street name):  [id number] - [place], [place] - [address] AT [facility] OF [address]  [address] [place] [facility] [address]  Phone: [phone] Fax: [phone]  Hours: Open 24 hours   Has the patient been seen for an appointment in the last year OR does the patient have an upcoming appointment? Yes.    Agent: Please be advised that RX refills may take up to 3 business days. We ask that you follow-up with your pharmacy.

## 2022-08-06 NOTE — Telephone Encounter (Signed)
Requested Prescriptions  Pending Prescriptions Disp Refills   diclofenac Sodium (VOLTAREN) 1 % GEL 100 g 1    Sig: Apply 2 g topically in the morning and at bedtime.     Analgesics:  Topicals Failed - 08/05/2022  4:03 PM      Failed - Manual Review: Labs are only required if the patient has taken medication for more than 8 weeks.      Failed - Cr in normal range and within 360 days    Creatinine, Ser  Date Value Ref Range Status  07/03/2022 1.21 (H) 0.44 - 1.00 mg/dL Final         Passed - PLT in normal range and within 360 days    Platelets  Date Value Ref Range Status  07/03/2022 336 150 - 400 K/uL Final         Passed - HGB in normal range and within 360 days    Hemoglobin  Date Value Ref Range Status  07/03/2022 13.9 12.0 - 15.0 g/dL Final         Passed - HCT in normal range and within 360 days    HCT  Date Value Ref Range Status  07/03/2022 41.3 36.0 - 46.0 % Final         Passed - eGFR is 30 or above and within 360 days    GFR calc Af Amer  Date Value Ref Range Status  03/29/2019 >60 >60 mL/min Final   GFR, Estimated  Date Value Ref Range Status  07/03/2022 52 (L) >60 mL/min Final    Comment:    (NOTE) Calculated using the CKD-EPI Creatinine Equation (2021)    eGFR  Date Value Ref Range Status  05/23/2022 59 (L) >59 mL/min/1.73 Final         Passed - Patient is not pregnant      Passed - Valid encounter within last 12 months    Recent Outpatient Visits           2 weeks ago Acute right ankle pain   [place] [facility] Marcine Matar, MD   1 month ago Essential hypertension   [facility] [place], [name], RPH-CPP   2 months ago Essential hypertension   [facility] [place], [name], RPH-CPP   4 months ago Essential hypertension   [facility] Marcine Matar, MD   5 months ago Essential hypertension    [facility] [name], RPH-CPP       Future Appointments             In 3 days [name], [name], RPH-CPP [facility]   In 5 months [name], [name], MD [facility]

## 2022-08-07 DIAGNOSIS — R030 Elevated blood-pressure reading, without diagnosis of hypertension: Secondary | ICD-10-CM | POA: Diagnosis not present

## 2022-08-07 DIAGNOSIS — Z79899 Other long term (current) drug therapy: Secondary | ICD-10-CM | POA: Diagnosis not present

## 2022-08-07 DIAGNOSIS — R519 Headache, unspecified: Secondary | ICD-10-CM | POA: Diagnosis not present

## 2022-08-07 DIAGNOSIS — M79673 Pain in unspecified foot: Secondary | ICD-10-CM | POA: Diagnosis not present

## 2022-08-07 DIAGNOSIS — Z6826 Body mass index (BMI) 26.0-26.9, adult: Secondary | ICD-10-CM | POA: Diagnosis not present

## 2022-08-09 ENCOUNTER — Ambulatory Visit: Payer: Medicaid Other | Admitting: Pharmacist

## 2022-08-09 DIAGNOSIS — Z79899 Other long term (current) drug therapy: Secondary | ICD-10-CM | POA: Diagnosis not present

## 2022-08-16 ENCOUNTER — Ambulatory Visit (INDEPENDENT_AMBULATORY_CARE_PROVIDER_SITE_OTHER): Payer: Medicaid Other | Admitting: Podiatry

## 2022-08-16 DIAGNOSIS — M19071 Primary osteoarthritis, right ankle and foot: Secondary | ICD-10-CM

## 2022-08-16 DIAGNOSIS — M79674 Pain in right toe(s): Secondary | ICD-10-CM | POA: Diagnosis not present

## 2022-08-16 DIAGNOSIS — M79675 Pain in left toe(s): Secondary | ICD-10-CM

## 2022-08-16 DIAGNOSIS — B351 Tinea unguium: Secondary | ICD-10-CM

## 2022-08-16 MED ORDER — BETAMETHASONE SOD PHOS & ACET 6 (3-3) MG/ML IJ SUSP
3.0000 mg | Freq: Once | INTRAMUSCULAR | Status: AC
Start: 2022-08-16 — End: 2022-08-16
  Administered 2022-08-16: 3 mg via INTRA_ARTICULAR

## 2022-08-16 NOTE — Progress Notes (Signed)
Chief Complaint  Patient presents with   Nail Problem    Patient came in today for nail problem 5th toes, and right shin pain,     HPI: 57 y.o. female presenting today with her uncle for evaluation of right ankle pain.  Patient's caregiver with her today states that she was diagnosed with arthritis of the right ankle.  Denies a history of injury.  They have been applying topical anti-inflammatories with minimal improvement she is also been experiencing some sensitivity to the toenails.  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/2011   Mental retardation    Microcephaly (HCC)    Seizures (HCC)    last one more than 20 yrs ago   Seizures (HCC)    years ago; more than 20 years ago    Past Surgical History:  Procedure Laterality Date   CERVICAL CONE BIOPSY  04/23/2007   CERVICAL CONIZATION W/BX  08/20/2011   Procedure: CONIZATION CERVIX WITH BIOPSY;  Surgeon: Allie Bossier, MD;  Location: [facility];  Service: Gynecology;  Laterality: N/A;  With Endocervical Currettage   COLPOSCOPY  06/21/2011   COLPOSCOPY N/A 05/28/2016   Procedure: COLPOSCOPY;  Surgeon: Catalina Antigua, MD;  Location: [facility];  Service: Gynecology;  Laterality: N/A;   DILATION AND CURETTAGE OF UTERUS N/A 05/28/2016   Procedure: DILATATION AND CURETTAGE;  Surgeon: Catalina Antigua, MD;  Location: [facility];  Service: Gynecology;  Laterality: N/A;   LEEP  04/23/2007   LUMBAR LAMINECTOMY/DECOMPRESSION MICRODISCECTOMY N/A 03/31/2019   Procedure: LAMINECTOMY LUMBAR THREE- LUMBAR FOUR, LUMBAR FOUR- LUMBAR FIVE;  Surgeon: Coletta Memos, MD;  Location: [facility];  Service: Neurosurgery;  Laterality: N/A;  LAMINECTOMY LUMBAR THREE- LUMBAR FOUR, LUMBAR FOUR- LUMBAR FIVE   MOUTH SURGERY Left 06/2021   Jaw surgery   RADIOLOGY WITH ANESTHESIA N/A 10/17/2020   Procedure: MRI WITH ANESTHESIA LUMBAR SPINE WITH AND WITHOUT  CONTRAST;  Surgeon: Radiologist, Medication, MD;  Location: [facility];  Service: Radiology;  Laterality: N/A;   RADIOLOGY WITH ANESTHESIA N/A 11/06/2021   Procedure: MRI WITH ANESTHESIA OF BRAIN WITH AND WITHOUT CONTRAST, MR ANGIOGRAM OF HEAD WITHOUT CONTRAST;  Surgeon: Radiologist, Medication, MD;  Location: [facility];  Service: Radiology;  Laterality: N/A;   TOOTH EXTRACTION N/A 06/29/2021   Procedure: REMOVAL BILATERAL MANDIBULAR LINGUAL TORI;  Surgeon: Ocie Doyne, DMD;  Location: [facility];  Service: Oral Surgery;  Laterality: N/A;    No Known Allergies   Physical Exam: General: The patient is alert and oriented x3 in no acute distress.  Dermatology: Skin is warm, dry and supple bilateral lower extremities.  There are some slight incurvated nails noted to the lesser digits bilateral with associated tenderness  Vascular: Palpable pedal pulses bilaterally. Capillary refill within normal limits.  No appreciable edema.  No erythema.  Neurological: Grossly intact via light touch  Musculoskeletal Exam: No pedal deformities noted.  There is some tenderness with palpation to the anterior aspect of the right ankle  Radiographic Exam RT ankle 07/30/2022:  FINDINGS: Ankle mortise is symmetric and intact. Joint spaces preserved. No acute fracture is seen. Minimal dorsal talonavicular and navicular-cuneiform degenerative spurring on lateral view. No acute fracture or dislocation.   IMPRESSION: Minimal dorsal talonavicular and navicular-cuneiform degenerative spurring.  Assessment/Plan of Care: 1.  Arthritis right ankle 2.  Symptomatic toenails bilateral  -Patient evaluated.  Prior x-rays reviewed -Injection of  0.5 cc Celestone Soluspan injected into the anterior aspect of the right ankle joint -Continue topical anti-inflammatory gel -Mechanical debridement of the lesser digits of the toenails 2-5 was performed using a nail nipper without incident or bleeding -Return to clinic as needed        Felecia Shelling, DPM [facility]  Dr. Felecia Shelling, DPM    [address] [place], [place] [address]                Office [phone]  Fax [phone]

## 2022-08-19 ENCOUNTER — Telehealth: Payer: Self-pay | Admitting: Podiatry

## 2022-08-19 NOTE — Telephone Encounter (Signed)
Pts husband left message 4.28 @ 1136am stating pts 2 toes are still  painful and bothering her and he feels they may need to come off.  Pt scheduled this morning to see Dr Annamary Rummage on 5.2.2024.  I did call and no voicemail set up.

## 2022-08-22 ENCOUNTER — Ambulatory Visit (INDEPENDENT_AMBULATORY_CARE_PROVIDER_SITE_OTHER): Payer: Medicaid Other | Admitting: Podiatry

## 2022-08-22 DIAGNOSIS — L600 Ingrowing nail: Secondary | ICD-10-CM | POA: Diagnosis not present

## 2022-08-22 NOTE — Progress Notes (Signed)
  Subjective:  Patient ID: Brandy Galloway, female    DOB: 08-29-1965,  MRN: [id number]  Chief Complaint  Patient presents with   Ingrown Toenail    Pain with left 5th toenail.    57 y.o. female presents pain in bilateral fifth toenail.  Her husband was later concerned about possible ingrown nail.  She has been having a lot of pain with the nails despite them being trimmed recently.  Denies any drainage  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/2011   Mental retardation    Microcephaly (HCC)    Seizures (HCC)    last one more than 20 yrs ago   Seizures (HCC)    years ago; more than 20 years ago    No Known Allergies  ROS: Negative except as per HPI above  Objective:  General: AAO x3, NAD  Dermatological: Incurvation is present along the medial nail border of the bilateral 5th toe. There is localized edema without any erythema or increase in warmth around the nail border. There is no drainage or pus. There is no ascending cellulitis. No malodor. No open lesions or pre-ulcerative lesions.    Vascular:  Dorsalis Pedis artery and Posterior Tibial artery pedal pulses are 2/4 bilateral.  Capillary fill time < 3 sec to all digits.   Neruologic: Grossly intact via light touch bilateral. Protective threshold intact to all sites bilateral.   Musculoskeletal: No gross boney pedal deformities bilateral. No pain, crepitus, or limitation noted with foot and ankle range of motion bilateral. Muscular strength 5/5 in all groups tested bilateral.  Gait: Unassisted, Nonantalgic.   No images are attached to the encounter.  Assessment:   1. Ingrown nail of fifth toe of right foot   2. Ingrown nail of fifth toe of left foot      Plan:  Patient was evaluated and treated and all questions answered.   Ingrown Nail, bilateral 5th toenail -Patient elects to proceed  with minor surgery to remove ingrown toenail today. Consent reviewed and signed by patient. -Ingrown nail excised. See procedure note. -Educated on post-procedure care including soaking. Written instructions provided and reviewed. -Patient to follow up in 2 weeks for nail check.  Procedure: Excision of Ingrown Toenail Location: Bilateral 5th toe  total  nail  Anesthesia: Lidocaine 1% plain; 1.5 mL and Marcaine 0.5% plain; 1.5 mL, digital block. Skin Prep: Betadine. Dressing: Silvadene; telfa; dry, sterile, compression dressing. Technique: Following skin prep, the toe was exsanguinated and a tourniquet was secured at the base of the toe. The affected nail border was freed, split with a nail splitter, and excised. Chemical matrixectomy was then performed with phenol and irrigated out with alcohol. The tourniquet was then removed and sterile dressing applied. Disposition: Patient tolerated procedure well. Patient to return in 2 weeks for follow-up.    Return in about 2 weeks (around 09/05/2022) for nail check.          Corinna Gab, DPM [facility] / [facility]

## 2022-08-22 NOTE — Patient Instructions (Signed)

## 2022-08-28 ENCOUNTER — Telehealth: Payer: Self-pay | Admitting: Internal Medicine

## 2022-08-28 NOTE — Telephone Encounter (Signed)
Copied from CRM [id number]. Topic: General - Other >> Aug 28, 2022 10:09 AM Pincus Sanes wrote: Reason for CRM: Brandy Galloway that cares for pt wanted to let dr know that she had surgery and had both her toenails removed and is healing nicely and will be in to touch base after pt heals up.Brandy Galloway's # [phone]

## 2022-09-03 ENCOUNTER — Ambulatory Visit: Payer: Medicaid Other | Admitting: Pharmacist

## 2022-09-05 ENCOUNTER — Ambulatory Visit (INDEPENDENT_AMBULATORY_CARE_PROVIDER_SITE_OTHER): Payer: Medicaid Other | Admitting: Podiatry

## 2022-09-05 DIAGNOSIS — L600 Ingrowing nail: Secondary | ICD-10-CM

## 2022-09-05 NOTE — Progress Notes (Signed)
Subjective: Brandy Galloway is a 57 y.o.  female returns to office today for follow up evaluation after having bilateral 5th toe total nail ingrown removal with phenol and alcohol matrixectomy approximately 2 weeks ago. Patient has been soaking using epsom salts and applying topical antibiotic covered with bandaid daily. Patient denies fevers, chills, nausea, vomiting. Denies any calf pain, chest pain, SOB.   Objective:  Vitals: Reviewed  General: Well developed, nourished, in no acute distress, alert and oriented x3   Dermatology: Skin is warm, dry and supple bilateral. Bilateral 5th nail bed appears to be clean, dry, with mild granular tissue and surrounding scab. There is no surrounding erythema, edema, drainage/purulence. The remaining nails appear unremarkable at this time. There are no other lesions or other signs of infection present.  Neurovascular status: Intact. No lower extremity swelling; No pain with calf compression bilateral.  Musculoskeletal: Decreased tenderness to palpation of the bilateral 5th toe total nail avulsion. Muscular strength within normal limits bilateral.   Assesement and Plan: S/p phenol and alcohol matrixectomy to the  bilaterla 5th toe nail total, doing well.   -Continue soaking in epsom salts twice a day followed by antibiotic ointment and a band-aid. Can leave uncovered at night. Continue this until completely healed.  -If the area has not healed in 2 weeks, call the office for follow-up appointment, or sooner if any problems arise.  -Monitor for any signs/symptoms of infection. Call the office immediately if any occur or go directly to the emergency room. Call with any questions/concerns.        Corinna Gab, DPM [facility] / [facility]                   09/05/2022

## 2022-09-10 ENCOUNTER — Telehealth: Payer: Self-pay | Admitting: Podiatry

## 2022-09-10 NOTE — Telephone Encounter (Signed)
Called pts guardian and his mailbox is not taking calls at this time.Marland Kitchen

## 2022-09-10 NOTE — Telephone Encounter (Signed)
Pts guardian called and pt is complaining of having a lot of pain on both feet since nails were taken off. He stated her pcp said not to take the tylenol and motrin for the pain is there something else that pt can get for the pain?

## 2022-09-11 ENCOUNTER — Ambulatory Visit: Payer: Self-pay | Admitting: *Deleted

## 2022-09-11 NOTE — Telephone Encounter (Signed)
Pts guardian left message last night returning a call.    I called pts guardian back and explained that Dr Annamary Rummage does not give narcotics for pts procedure and to take tylenol or motrin. He said her doctor said for her not to take those medications so I recommended him to call her doctor to see what other medication she could possibly take for pain. He said he would and will let us know.

## 2022-09-11 NOTE — Telephone Encounter (Signed)
Spoke with patient uncle Advised per PCP it is okay for her to take Tylenol but not if she is also taking Hydrocodone/Tylenol (also know as Vicodine) from her pain specialist.  If she is done with the Hydrocodone/Tylenol pills, then ok to take regular Tylenol. Patient voiced understanding of all discussed .

## 2022-09-11 NOTE — Telephone Encounter (Signed)
Summary: Pt uncle requests call back regarding concerns about meds foot doctor told pt to take   Pt uncle [name] requests call back from a nurse because he stated pt saw the foot doctor and she was told to take Tylenol or Motrin for the pain but Dr. [name] told the pt not to take either of those medications. [name] requests call back. Cb# [phone]           Chief Complaint: medication question regarding tylenol or motrin Symptoms: toe pain from having 2 toenails removed by "foot Dr." And told to give patient tylenol or motrin and DPR reports he was told by PCP not to give patient either of those medications. Frequency: today  Pertinent Negatives: Patient denies na  Disposition: [] ED /[] Urgent Care (no appt availability in office) / [] Appointment(In office/virtual)/ []  [facility]/ [] Home Care/ [] Refused Recommended Disposition /[] [facility]/ [x]  Follow-up with PCP Additional Notes:   DPR requesting a call back today or asap due to patient is in pain. Can he give something else besides tylenol or motrin for pain.        Reason for Disposition  [1] Caller has medicine question about med NOT prescribed by PCP AND [2] triager unable to answer question (e.g., compatibility with other med, storage)  Answer Assessment - Initial Assessment Questions 1. NAME of MEDICINE: "What medicine(s) are you calling about?"     Tylenol and motrin 2. QUESTION: "What is your question?" (e.g., double dose of medicine, side effect)     Is it ok for patient to take either medication for toe pain ? Foot Dr. Venia Carbon 2 toenails and told to take tylenol or motrin for pain but PCP told DPR not to give either medication to patient . 3. PRESCRIBER: "Who prescribed the medicine?" Reason: if prescribed by specialist, call should be referred to that group.     Foot Dr. 4. SYMPTOMS: "Do you have any symptoms?" If Yes, ask: "What symptoms are you having?"  "How bad are the symptoms (e.g.,  mild, moderate, severe)     Toe pain  5. PREGNANCY:  "Is there any chance that you are pregnant?" "When was your last menstrual period?"     na  Protocols used: Medication Question Call-A-AH

## 2022-09-16 ENCOUNTER — Encounter: Payer: Self-pay | Admitting: Podiatry

## 2022-09-16 MED ORDER — IBUPROFEN 800 MG PO TABS: 800.0000 mg | ORAL_TABLET | Freq: Three times a day (TID) | ORAL | 0 refills | Status: AC | PRN

## 2022-09-17 ENCOUNTER — Telehealth: Payer: Self-pay | Admitting: *Deleted

## 2022-09-17 NOTE — Telephone Encounter (Signed)
Tried calling the guardian of patient  to give updated information per physician, no answer, could not leave voice message,mail box full.

## 2022-09-17 NOTE — Telephone Encounter (Addendum)
Mr Johnson(guardian) is calling because the motrin nor the tylenol are helping for her B/ l toes that are still hurting, is there anything else she can take?

## 2022-09-25 LAB — COLOGUARD

## 2022-09-27 ENCOUNTER — Ambulatory Visit (INDEPENDENT_AMBULATORY_CARE_PROVIDER_SITE_OTHER): Payer: Medicaid Other | Admitting: Podiatry

## 2022-09-27 DIAGNOSIS — L600 Ingrowing nail: Secondary | ICD-10-CM

## 2022-09-27 DIAGNOSIS — M792 Neuralgia and neuritis, unspecified: Secondary | ICD-10-CM | POA: Diagnosis not present

## 2022-09-27 MED ORDER — GABAPENTIN 300 MG PO CAPS
300.0000 mg | ORAL_CAPSULE | Freq: Three times a day (TID) | ORAL | 3 refills | Status: DC
Start: 2022-09-27 — End: 2022-12-13

## 2022-09-27 NOTE — Progress Notes (Signed)
  Subjective:  Patient ID: Brandy Galloway, female    DOB: 1965/06/08,  MRN: [id number]  Chief Complaint  Patient presents with   Toe Pain    Nail avulsions to bilateral 5th toes 2 weeks ago still having pain. Patient complaining of sharp pain that comes and goes. Patient is taking motrin for pain.     57 y.o. female presents for follow-up of nail avulsion of the bilateral fifth toenails.  She says she is having burning tingling pain in both feet.  Does not take any nerve pain medications.  Has been doing Epsom salt soaks and applying ointment to the fifth toe bilaterally and taking Motrin for pain.  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/2011   Mental retardation    Microcephaly (HCC)    Seizures (HCC)    last one more than 20 yrs ago   Seizures (HCC)    years ago; more than 20 years ago    No Known Allergies  ROS: Negative except as per HPI above  Objective:  General: AAO x3, NAD  Dermatological: Eschar present in the nailbed of the bilateral fifth toe there is no open wound no erythema no maceration no evidence of infection  Vascular:  Dorsalis Pedis artery and Posterior Tibial artery pedal pulses are 2/4 bilateral.  Capillary fill time < 3 sec to all digits.   Neruologic: Grossly diminished to light touch and subjective sensation of burning and tingling over the bilateral foot  Musculoskeletal: No gross boney pedal deformities bilateral. No pain, crepitus, or limitation noted with foot and ankle range of motion bilateral. Muscular strength 5/5 in all groups tested bilateral.  Gait: Unassisted, Nonantalgic.   No images are attached to the encounter.  Radiographs:  Deferred Assessment:   1. Neuropathic pain   2. Ingrown nail of fifth toe of right foot   3. Ingrown nail of fifth toe of left foot      Plan:  Patient was evaluated and  treated and all questions answered.  # Neuropathic pain bilateral foot -Discussed with patient that I believe her pain at this point is less related to the nail procedure and more so related to generalized neuropathic pain of both feet -I recommend treatment with gabapentin 300 mg take 3 times daily for the next 90 days. -Discussed the risks and benefits of gabapentin as well as the risk of drowsiness with the medication. -Patient will follow-up in 6 weeks to recheck her progress on the medication -Continue Epsom salt soaks as needed for the fifth toes however they appear mostly healed at this point in time  Return in about 6 weeks (around 11/08/2022) for Follow-up neuropathic pain.          Corinna Gab, DPM [facility] / [facility]

## 2022-10-01 ENCOUNTER — Other Ambulatory Visit (INDEPENDENT_AMBULATORY_CARE_PROVIDER_SITE_OTHER): Payer: Medicaid Other | Admitting: Podiatry

## 2022-10-01 ENCOUNTER — Telehealth: Payer: Self-pay | Admitting: Podiatry

## 2022-10-01 DIAGNOSIS — M792 Neuralgia and neuritis, unspecified: Secondary | ICD-10-CM

## 2022-10-01 NOTE — Telephone Encounter (Signed)
Pt's caregiver stated that pain medication is not working; she has been taking medicine as directed but she is still experiencing a lot of pain. He wanted to know is there a stronger Rx that can be prescribed or dosage increased. Please advise

## 2022-10-01 NOTE — Progress Notes (Signed)
Referral to neurology sent

## 2022-10-02 ENCOUNTER — Telehealth: Payer: Self-pay | Admitting: Podiatry

## 2022-10-02 DIAGNOSIS — M792 Neuralgia and neuritis, unspecified: Secondary | ICD-10-CM

## 2022-10-02 NOTE — Telephone Encounter (Signed)
Pt's caregiver stated that [facility] will not see Ms. Whobrey; they stated the referral needs to be sent to [facility]. Please advise

## 2022-10-02 NOTE — Addendum Note (Signed)
Addended by: Enedina Finner on: 10/02/2022 04:32 PM   Modules accepted: Orders

## 2022-10-02 NOTE — Telephone Encounter (Signed)
Pts uncle called stating pt was to have been referred to neurology and they have not heard anything. Upon checking chart there was a referral to [facility] and I gave pt the number to call them to get pt scheduled.

## 2022-10-02 NOTE — Telephone Encounter (Addendum)
I sent the prescription to [facility].  I called and left them a message that an order was placed in Epic, just in case they do not work off a workqueue.  I asked them to call if they have any questions.  Danna from [facility] called.  She stated that an order had to be sent via Epic, faxed, or [employer] and the notes have to be sent as well.  I told her I would send it over.  I asked for the fax number.  It is [phone].

## 2022-10-07 ENCOUNTER — Telehealth: Payer: Self-pay

## 2022-10-07 ENCOUNTER — Encounter: Payer: Self-pay | Admitting: Neurology

## 2022-10-07 ENCOUNTER — Ambulatory Visit: Payer: Medicaid Other | Admitting: Neurology

## 2022-10-07 ENCOUNTER — Telehealth: Payer: Self-pay | Admitting: Podiatry

## 2022-10-07 VITALS — BP 168/98 | HR 89 | Ht 68.0 in | Wt 158.0 lb

## 2022-10-07 DIAGNOSIS — M79675 Pain in left toe(s): Secondary | ICD-10-CM | POA: Diagnosis not present

## 2022-10-07 DIAGNOSIS — M79674 Pain in right toe(s): Secondary | ICD-10-CM

## 2022-10-07 NOTE — Patient Instructions (Addendum)
Please follow-up with your primary care provider about toe pain.    You can try over the counter lidocaine ointment to your toes as needed.

## 2022-10-07 NOTE — Telephone Encounter (Signed)
Patient came in with her uncle stating she had 2 toenails removed from [facility] 6/7 patients uncle states they gave her Gabapentin 300 MG but she is still in pain. He went back to the doctor they told him it could possibly be a nerve. He will set up an appt with her PCP and will go back to foot doctor to ask what the next steps are.

## 2022-10-07 NOTE — Progress Notes (Signed)
Follow-up Visit   Date: 10/07/2022    Brandy Galloway MRN: [id number] DOB: [birth year]    Brandy Galloway is a 57 y.o. right-handed female with hypertension, developmental delay, and seizure disorder referred for bilateral toe pain.  The patient was accompanied to the clinic by legal guardian, Brandy Galloway, who provides information.    IMPRESSION/PLAN: Bilateral 5th toenail pain following ingrown toe nail removal by podiatry in early May 2023.  Pain is localized to the nail bilaterally.  She does not have neuropathic pain (tingling, stabbing, numbness) and describes symptoms as soreness, throbbing, and achy pain. She has no benefit with gabapentin, NSAIDs, or tylenol.  I explained that her pain is not consistent with neuropathy and it is unlikely that medications such as gabapentin will be helpful.  I am not sure why she was referred to see me for her toenail pain.   I suggest that she follow-up with PCP or return to her podiatrist for pain management, as she does not have a primary neurological condition for her pain. In the meantime, she can try OTC lidocaine ointment.    --------------------------------------------- UPDATE 10/07/2022:  She reports having bilateral 5th toe nail removal in may due to ingrown toenails.  Since having this procedure, she has intermittent spells of soreness and throbbing pain at the nail, where it was removed.  She keeps telling Brandy Galloway that her nail hurts and he has tried soaking the feet, tylenol, and NSAIDs without benefit.  Her podiatrist started gabapentin which does not help.  He then referred her here.   She denies numbness/tingling of the toes.    Medications:  Current Outpatient Medications on File Prior to Visit  Medication Sig Dispense Refill   acetaminophen (TYLENOL) 500 MG tablet Take 1 tablet (500 mg total) by mouth every 6 (six) hours as needed. 60 tablet 1   amLODipine (NORVASC) 10 MG tablet Take 1 tablet (10 mg total) by mouth daily. 90 tablet 1    diclofenac Sodium (VOLTAREN) 1 % GEL Apply 2 g topically in the morning and at bedtime. 100 g 1   gabapentin (NEURONTIN) 300 MG capsule Take 1 capsule (300 mg total) by mouth 3 (three) times daily. 90 capsule 3   hydrALAZINE (APRESOLINE) 50 MG tablet Take 1.5 tablets (75 mg total) by mouth 3 (three) times daily. 135 tablet 6   hydrochlorothiazide (HYDRODIURIL) 25 MG tablet Take 1 tablet (25 mg total) by mouth every morning. 90 tablet 1   pregabalin (LYRICA) 50 MG capsule Take 100 mg by mouth 2 (two) times daily.     sucralfate (CARAFATE) 1 g tablet Take 1 tablet (1 g total) by mouth 3 (three) times daily with meals for 7 days. 21 tablet 0   triamcinolone cream (KENALOG) 0.5 % APPLY TOPICALLY TO THE AFFECTED AREA TWICE DAILY AS NEEDED 30 g 0   Vitamin D, Ergocalciferol, (DRISDOL) 1.25 MG (50000 UNIT) CAPS capsule Take 50,000 Units by mouth once a week.     HYDROcodone-acetaminophen (NORCO/VICODIN) 5-325 MG tablet Take 1 tablet by mouth 3 (three) times daily. (Patient not taking: Reported on 10/07/2022)     ondansetron (ZOFRAN) 4 MG tablet Take 1 tablet (4 mg total) by mouth every 4 (four) hours as needed for nausea or vomiting. (Patient not taking: Reported on 10/07/2022) 12 tablet 0   pantoprazole (PROTONIX) 20 MG tablet Take 1 tablet (20 mg total) by mouth daily for 14 days. (Patient not taking: Reported on 10/07/2022) 14 tablet 0   No current facility-administered  medications on file prior to visit.    Allergies: No Known Allergies  Vital Signs:  BP (!) 185/107   Pulse 89   Ht 5\' 8"  (1.727 m)   Wt 158 lb (71.7 kg)   LMP  (LMP Unknown)   SpO2 97%   BMI 24.02 kg/m    Neurological Exam: MENTAL STATUS including orientation to time, place, person, recent and remote memory, attention span and concentration, language, and fund of knowledge is fair.  Speech is slow and moderately dysarthric,  she can follow simple commands and answers yes/no questions.  CRANIAL NERVES:  No visual field  defects.  Pupils equal round and reactive to light.  Esotropia bilaterally, normal conjugate, extra-ocular eye movements in all directions of gaze.  No ptosis.  Face is symmetric. Palate elevates symmetrically.  Tongue is midline.  MOTOR:  Motor strength is 5/5 in all extremities, including distally with toe extension and flexion bilaterally.  No atrophy, fasciculations or abnormal movements.  No pronator drift.  Tone is normal.    MSRs:  Reflexes are 3+/4 throughout and 2+/4 at the ankles bilaterally.  SENSORY:  Intact to vibration, pin prick, and temperature throughout.  COORDINATION/GAIT:     Gait is mildly wide-based, stooped, stable, unassisted   Data: n/a   Thank you for allowing me to participate in patient's care.  If I can answer any additional questions, I would be pleased to do so.    Sincerely,    Javana Schey K. Allena Katz, DO

## 2022-10-07 NOTE — Telephone Encounter (Signed)
He came in for Brandy Galloway the PCP told him to come here due to pain. The neuropathy specialist told him the pain is not related to nerve damage. The Gabaoentin 300MG  is not working for her pain level and would like something else for pain.If you could reach out to him about further instructions on update of future care plan for Brandy Galloway.

## 2022-10-08 ENCOUNTER — Encounter: Payer: Self-pay | Admitting: Podiatry

## 2022-10-08 ENCOUNTER — Telehealth: Payer: Self-pay | Admitting: Podiatry

## 2022-10-08 ENCOUNTER — Other Ambulatory Visit: Payer: Self-pay | Admitting: Podiatry

## 2022-10-08 MED ORDER — METHYLPREDNISOLONE 4 MG PO TBPK: ORAL_TABLET | ORAL | 0 refills | Status: DC

## 2022-10-08 NOTE — Telephone Encounter (Signed)
Pt's uncle called to inquire about Rx request. Please advise

## 2022-10-08 NOTE — Progress Notes (Signed)
Patient's family member called and left voicemail that patient is still having moderate pain in the 5th toes where the toenails were removed by Dr. Annamary Rummage.  The message that was left by [name] on behalf of the patient stated, "The Tylenol isn't working and she can't take ibuprofen.  She needs something else"  He did note it didn't have to be a narcotic and they were okay with trying something else.  Reviewed patient's history and meds.  She is far enough out from the procedures, that she may benefit from a Medrol Dose pack for the pain.  She had seen neurology and they said her pain was not neuropathic and referred her back here.    Sent in Rx Medrol Dose pack to her pharmacy on file today.   Called patient's contact numbers on file, but voicemail is not set up to receive messages.

## 2022-10-09 NOTE — Telephone Encounter (Signed)
Noted. Patient has upcoming appointment on 10/17/2022 to address pain with Dr.Johnson.  Appointment for [facility] scheduled for 11/07/2022.

## 2022-10-17 ENCOUNTER — Ambulatory Visit: Payer: Medicaid Other | Admitting: Internal Medicine

## 2022-11-03 ENCOUNTER — Other Ambulatory Visit: Payer: Self-pay | Admitting: Internal Medicine

## 2022-11-04 ENCOUNTER — Ambulatory Visit: Payer: Self-pay | Admitting: *Deleted

## 2022-11-04 NOTE — Telephone Encounter (Signed)
  Chief Complaint: General question about how many Pop Tarts her guardian should allow her to have. Symptoms: N/A Frequency: N/A Pertinent Negatives: Patient denies N/a Disposition: [] ED /[] Urgent Care (no appt availability in office) / [] Appointment(In office/virtual)/ []  [place] [facility]/ [x] Home Care/ [] Refused Recommended Disposition /[] [place] [facility]/ []  Follow-up with PCP Additional Notes: Gave her uncle/legal guardian encouragement on getting her off the Pop Tarts on eating the sugar free cookies instead.

## 2022-11-04 NOTE — Telephone Encounter (Signed)
Message from Brandy Galloway sent at 11/04/2022  1:56 PM EDT  Summary: Food Advice needed   Patient loves pop tarts. Advice needed... How much is too much?          Call History  Contact Date/Time Type Contact Phone/Fax User  11/04/2022 01:51 PM EDT Phone (Incoming) Brandy Galloway (Emergency Contact) [phone] Brandy Galloway    Reason for Disposition  General information question, no triage required and triager able to answer question  Answer Assessment - Initial Assessment Questions 1. REASON FOR CALL or QUESTION: "What is your reason for calling today?" or "How can I best help you?" or "What question do you have that I can help answer?"    I returned call to Brandy Galloway.   I take care of her.   I'm her legal guardian.  He was wanting to know how many Pop Tarts she could have?     I was giving her 2 Pop Tarts but I'm trying to space them apart.   I'll give her one now and keep the other one until next week or next month.    I'm trying to get her to eat the sugar free cookies.    She has an appt in Sept. With Brandy Galloway and I think they are going to check on her sugar and all.    I gave him encouragement and that the plan of getting her to eat the sugar free cookies would be much better than the Pop Tarts that are loaded with sugar.   We had completed our conversation and was wrapping up when the line disconnected.     I did confirm her appt with Dr.  Laural Galloway prior to the line disconnecting for Sept.  Protocols used: Information Only Call - No Triage-A-AH

## 2022-11-06 ENCOUNTER — Telehealth: Payer: Self-pay

## 2022-11-06 ENCOUNTER — Encounter: Payer: Self-pay | Admitting: *Deleted

## 2022-11-06 NOTE — Telephone Encounter (Signed)
 This encounter was created in error - please disregard.

## 2022-11-06 NOTE — Telephone Encounter (Signed)
Patient's uncle [name], on [place], called saying he's returning a nurse call, the call was disconnected before transfer to NT. I called him back. He says there was a call on his phone from a couple days ago. I advised he spoke to [place], [place] on 11/04/22 regarding pop tarts. He says yes, that was the call and he did speak to her, but it was still showing on his phone, so he called back today. He has no current issues with the patient at this time other than her toe, which he is taking her to the podiatrist in the morning about that per Dr. [name] he stated.

## 2022-11-07 ENCOUNTER — Ambulatory Visit (INDEPENDENT_AMBULATORY_CARE_PROVIDER_SITE_OTHER): Payer: Medicaid Other | Admitting: Podiatry

## 2022-11-07 VITALS — BP 185/90 | HR 74

## 2022-11-07 DIAGNOSIS — L600 Ingrowing nail: Secondary | ICD-10-CM | POA: Diagnosis not present

## 2022-11-07 DIAGNOSIS — M792 Neuralgia and neuritis, unspecified: Secondary | ICD-10-CM

## 2022-11-07 MED ORDER — PREGABALIN 100 MG PO CAPS: 100.0000 mg | ORAL_CAPSULE | Freq: Two times a day (BID) | ORAL | 2 refills | Status: DC

## 2022-11-07 NOTE — Progress Notes (Signed)
  Subjective:  Patient ID: Brandy Galloway, female    DOB: 08/10/1965,  MRN: [id number]  Chief Complaint  Patient presents with   Nail Problem    "She had two of her toenails taken off and they've been bothering her since then.  I bought this at the drug store, Aspercreme Lidocaine, and I put it on this.  It numbs it but it doesn't take the pain away."    57 y.o. female presents for follow-up of nail avulsion of the bilateral fifth toenails.  Patient states that she is having pain in the fifth toes.  She says that they have been applying Aspercreme lidocaine which is helping somewhat with the pain but does not take it away completely.    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/2011   Mental retardation    Microcephaly (HCC)    Seizures (HCC)    last one more than 20 yrs ago   Seizures (HCC)    years ago; more than 20 years ago    No Known Allergies  ROS: Negative except as per HPI above  Objective:  General: AAO x3, NAD  Dermatological: Eschar present in the nailbed of the bilateral fifth toe there is no open wound no erythema no maceration no evidence of infection  Vascular:  Dorsalis Pedis artery and Posterior Tibial artery pedal pulses are 2/4 bilateral.  Capillary fill time < 3 sec to all digits.   Neruologic: Grossly diminished to light touch and subjective sensation of burning and tingling over the bilateral foot  Musculoskeletal: No gross boney pedal deformities bilateral. No pain, crepitus, or limitation noted with foot and ankle range of motion bilateral. Muscular strength 5/5 in all groups tested bilateral.  Gait: Unassisted, Nonantalgic.   No images are attached to the encounter.  Radiographs:  Deferred Assessment:   1. Neuropathic pain   2. Ingrown nail of fifth toe of right foot   3. Ingrown nail of fifth toe of left foot        Plan:  Patient was evaluated and treated and all questions answered.  # Neuropathic pain bilateral foot -Discussed with patient that I believe her pain at this point is less related to the nail procedure and more so related to generalized neuropathic pain of both feet -Recommend we switch from gabapentin to Lyrica 100 mg twice daily E Rx sent to pharmacy -Discussed the risks and benefits of Lyrica as well as the risk of drowsiness with the medication. -Patient will follow-up in 6 weeks to recheck her progress on the medication -Ordered ABI PVR testing of bilateral lower extremity due to concern for possible PAD however she does have palpable pulses and there is no evidence of gangrene or nonhealing at the fifth toes to indicate significant PAD but will check   Return in about 6 weeks (around 12/19/2022) for f/u neuropathic pain.          Corinna Gab, DPM [facility] / [facility]

## 2022-11-11 ENCOUNTER — Other Ambulatory Visit: Payer: Self-pay | Admitting: Podiatry

## 2022-11-11 DIAGNOSIS — I739 Peripheral vascular disease, unspecified: Secondary | ICD-10-CM

## 2022-11-11 NOTE — Progress Notes (Signed)
Patient's uncle called about vascular testing, did not see testing ordered by ABI/PVR discussed. Order placed and # given to call to schedule appt

## 2022-11-13 ENCOUNTER — Ambulatory Visit (HOSPITAL_COMMUNITY)
Admission: RE | Admit: 2022-11-13 | Discharge: 2022-11-13 | Disposition: A | Discharge status: Home / Self Care | Payer: Medicaid Other | Source: Ambulatory Visit | Attending: Podiatry | Admitting: Podiatry

## 2022-11-13 DIAGNOSIS — I739 Peripheral vascular disease, unspecified: Secondary | ICD-10-CM | POA: Diagnosis not present

## 2022-11-13 LAB — VAS US ABI WITH/WO TBI: Left ABI: 1.05

## 2022-11-14 ENCOUNTER — Telehealth: Payer: Self-pay | Admitting: Podiatry

## 2022-11-14 LAB — VAS US ABI WITH/WO TBI: Right ABI: 1.03

## 2022-11-14 NOTE — Telephone Encounter (Signed)
Patient family member called stating the he gave her the Lyrica and she was "out of it and I am not giving it to her anymore". He would like something else prescribed.

## 2022-11-18 ENCOUNTER — Ambulatory Visit: Payer: Self-pay

## 2022-11-18 ENCOUNTER — Other Ambulatory Visit: Payer: Self-pay | Admitting: Podiatry

## 2022-11-18 DIAGNOSIS — M792 Neuralgia and neuritis, unspecified: Secondary | ICD-10-CM

## 2022-11-18 NOTE — Telephone Encounter (Signed)
  Chief Complaint: Toe nail pain Symptoms: pain Frequency: since toenails were removed Pertinent Negatives: Patient denies  Disposition: [] ED /[] Urgent Care (no appt availability in office) / [] Appointment(In office/virtual)/ []  [place] [facility]/ [] Home Care/ [] Refused Recommended Disposition /[] [place] [facility]/ [x]  Follow-up with PCP Additional Notes: Spoke with [employer].Pt had toenails removed of little toes d/t ingrown nails. Pt was prescribed Lyrica 100mg . This medication was too strong and uncle had a hard time waking pt up. Uncle called podiatrist who stated he had nothing else to give pt for pain. PT had taken Lyrica 50mg   in the past. (Last time in [place] 2023.) Uncle states that pt was fine on 50 mg strength.  Uncle would just like some pain medication to help pt with pain in toes. Please advise.      Reason for Disposition  [1] Prescription refill request for ESSENTIAL medicine (i.e., likelihood of harm to patient if not taken) AND [2] triager unable to refill per department policy  Answer Assessment - Initial Assessment Questions 1. DRUG NAME: "What medicine do you need to have refilled?"     Lyrica 2. REFILLS REMAINING: "How many refills are remaining?" (Note: The label on the medicine or pill bottle will show how many refills are remaining. If there are no refills remaining, then a renewal may be needed.)     none 3. EXPIRATION DATE: "What is the expiration date?" (Note: The label states when the prescription will expire, and thus can no longer be refilled.)     NA 4. PRESCRIBING HCP: "Who prescribed it?" Reason: If prescribed by specialist, call should be referred to that group.     Dr. Annamary Rummage 5. SYMPTOMS: "Do you have any symptoms?"     Pain in toes  Protocols used: Medication Refill and Renewal Call-A-AH

## 2022-11-18 NOTE — Progress Notes (Signed)
Referral to pain clinic placed.

## 2022-11-19 ENCOUNTER — Telehealth: Payer: Self-pay | Admitting: Internal Medicine

## 2022-11-19 NOTE — Telephone Encounter (Signed)
I spoke patient's Uncle Jetta Lout verified that he was on DPR . Advised that Podiatry has placed a pain management on yesterday. Advised that he should be receiving a call shortly so that he can schedule an appointment. Advised that I would follow-up with him to see someone from pain management has contacted him

## 2022-11-19 NOTE — Telephone Encounter (Signed)
See pervious note

## 2022-11-19 NOTE — Telephone Encounter (Signed)
Copied from CRM [id number]. Topic: General - Other >> Nov 19, 2022 12:15 PM [place] B wrote: Reason for CRM: pt's uncle , returned call back from [place]. Please cal back

## 2022-11-20 ENCOUNTER — Ambulatory Visit: Payer: Self-pay | Admitting: *Deleted

## 2022-11-20 NOTE — Telephone Encounter (Signed)
Attempted to return the call to Mr. Jetta Lout (on [place]).   No voicemail set up so unable to leave a message. Will try again later to return his call.

## 2022-11-20 NOTE — Telephone Encounter (Signed)
  Chief Complaint: toe pain- "my feet kill me" patient points to her toes where nails where removed Symptoms: pain in toes- not sure cause of pain- if residual nerve pain from nail removal or something else in the toes- all looks normal Frequency: comes and goes Pertinent Negatives: Patient denies pains anywhere else Disposition: [] ED /[] Urgent Care (no appt availability in office) / [] Appointment(In office/virtual)/ []  [place] [facility]/ [] Home Care/ [] Refused Recommended Disposition /[] [place] [facility]/ [x]  Follow-up with PCP Additional Notes: Patient's uncle is concerned that going to pain clinic may not be helpful as far as figuring out what is causing the pain in patient toes. He states the patient is fine except for her 2 toes. He is concerned that the last Rx really made her sleepy and hard to wake up-but would be willing to try something different.  Any suggestion from PCP would be helpful- no open appointment within disposition  Reason for Disposition  Toe pain is a chronic symptom (recurrent or ongoing AND present > 4 weeks)  Answer Assessment - Initial Assessment Questions 1. ONSET: "When did the pain start?"      Pain since nails removed- not sure which toes are actually hurting 2. LOCATION: "Where is the pain located?"   (e.g., around nail, entire toe, at foot joint)      Just on the toe where nails removed 3. PAIN: "How bad is the pain?"    (Scale 1-10; or mild, moderate, severe)   -  MILD (1-3): doesn't interfere with normal activities    -  MODERATE (4-7): interferes with normal activities (e.g., work or school) or awakens from sleep, limping    -  SEVERE (8-10): excruciating pain, unable to do any normal activities, unable to walk     Off/on- comes and goes 4. APPEARANCE: "What does the toe look like?" (e.g., redness, swelling, bruising, pallor)     Appearance is normal 5. CAUSE: "What do you think is causing the toe pain?"     Not sure- but only since nail  removed 6. OTHER SYMPTOMS: "Do you have any other symptoms?" (e.g., leg pain, rash, fever, numbness)     no  Protocols used: Toe Pain-A-AH

## 2022-11-20 NOTE — Telephone Encounter (Signed)
Attempted to return the call to [name] (on [place]).  Got a message that messages are not being accepted at this time because the voice mailbox has not been set up yet.   Will try again later.

## 2022-11-20 NOTE — Telephone Encounter (Signed)
Summary: Toe Pain   Please call regarding Ms.Fritchman toe pain, [name] states he has not received a call back, as to where she can be taken to.  Last medication given was too strong, could not wake her up       Attempted to call Mr [name] regarding patient symptoms/medication- no answer and unable to leave call back message- VM not activated.

## 2022-11-21 NOTE — Telephone Encounter (Signed)
Spoke with patient uncle Re: PCP recommendation : I had sent her to [facility] in the past.  So he may want to call and schedule a f/u appt. similar issue when she had tooth removed.  Pain for months after the procedure and patient reportedly failing multiple nonnarcotic medications.     Also reminded of patient referral that was ordered by Podiatry. Advised that pain clinic will call to arrange appointment

## 2022-11-29 ENCOUNTER — Telehealth: Payer: Self-pay

## 2022-11-29 NOTE — Telephone Encounter (Signed)
Copied from CRM [id number]. Topic: General - Other >> Nov 28, 2022  5:12 PM Epimenio Foot F wrote: Reason for CRM: Pt's uncle is calling in requesting to speak with Dr. Henriette Combs nurse regarding the pt. Brandy Galloway says pt is still having issues with her toes and he hasn't heard anything from the office.

## 2022-12-03 ENCOUNTER — Telehealth: Payer: Self-pay | Admitting: Internal Medicine

## 2022-12-03 NOTE — Telephone Encounter (Signed)
Copied from CRM [id number]. Topic: General - Other >> Nov 28, 2022  5:12 PM Epimenio Foot F wrote: Reason for CRM: Pt's uncle is calling in requesting to speak with Dr. Henriette Combs nurse regarding the pt. Brandy Galloway says pt is still having issues with her toes and he hasn't heard anything from the office.

## 2022-12-03 NOTE — Telephone Encounter (Signed)
Copied from CRM [id number]. Topic: General - Other >> Nov 28, 2022  5:12 PM Epimenio Foot F wrote: Reason for CRM: Pt's uncle is calling in requesting to speak with Dr. Henriette Combs nurse regarding the pt. [name] says pt is still having issues with her toes and he hasn't heard anything from the office. >> Dec 03, 2022  1:49 PM Runell Gess P wrote: Pts uncle called back saying no one has returned his call from the end of last week.

## 2022-12-03 NOTE — Telephone Encounter (Signed)
Called & spoke to [place]. [name] states that they have purchased an OTC Aspercreme with Lidocaine cream to treat the pain meanwhile. [name] states that it temporarily relieves the pain for [place]. [name] is requesting for additional medication to help with the pain.

## 2022-12-03 NOTE — Telephone Encounter (Signed)
Duplicate

## 2022-12-04 NOTE — Telephone Encounter (Signed)
Brandy Galloway called & spoke with the [facility] center. Appointment scheduled for 12/13/2022 to further address concerns.

## 2022-12-06 ENCOUNTER — Other Ambulatory Visit: Payer: Self-pay | Admitting: Podiatry

## 2022-12-06 DIAGNOSIS — I739 Peripheral vascular disease, unspecified: Secondary | ICD-10-CM

## 2022-12-06 DIAGNOSIS — M792 Neuralgia and neuritis, unspecified: Secondary | ICD-10-CM

## 2022-12-06 NOTE — Progress Notes (Signed)
Pain management referral in [place]. Apparently patient's legal guardian, Mr. Jetta Lout, called and stated that he was contacted by pain management in [place] but he would prefer to go to the [facility] location for pain management.  New referral for pain management was placed to [facility], Physical Medicine and Rehabilitation, Specialty Services Required.  Referral placed 12/06/2022.  Felecia Shelling, DPM [facility]  Dr. Felecia Shelling, DPM    [address] [place], [place] [address]                Office [phone]  Fax [phone]

## 2022-12-09 ENCOUNTER — Telehealth: Payer: Self-pay | Admitting: Internal Medicine

## 2022-12-09 NOTE — Telephone Encounter (Signed)
Copied from CRM [id number]. Topic: General - Other >> Dec 06, 2022  5:59 PM Epimenio Foot F wrote: Reason for CRM: Pt's uncle is calling in because a nurse called him from [facility] and pt's uncle says he doesn't want to go to pain management in [place]. Pt's uncle is saying that the doctor that is helping with pt's toes isn't doing what they are supposed to do. Pt's uncle says he got meds from the pharmacy that goes on her toes but it heats up if it's on too long. Pt's uncle says if they want pt to go to pain management he prefers one in [place].

## 2022-12-13 ENCOUNTER — Ambulatory Visit: Payer: Medicaid Other | Admitting: Internal Medicine

## 2022-12-13 ENCOUNTER — Encounter: Payer: Self-pay | Admitting: Internal Medicine

## 2022-12-13 VITALS — BP 160/90 | HR 78 | Temp 98.2°F | Ht 68.0 in | Wt 161.0 lb

## 2022-12-13 DIAGNOSIS — Z23 Encounter for immunization: Secondary | ICD-10-CM

## 2022-12-13 DIAGNOSIS — M79675 Pain in left toe(s): Secondary | ICD-10-CM

## 2022-12-13 DIAGNOSIS — Z1211 Encounter for screening for malignant neoplasm of colon: Secondary | ICD-10-CM

## 2022-12-13 DIAGNOSIS — I10 Essential (primary) hypertension: Secondary | ICD-10-CM

## 2022-12-13 DIAGNOSIS — M79674 Pain in right toe(s): Secondary | ICD-10-CM

## 2022-12-13 DIAGNOSIS — Z8742 Personal history of other diseases of the female genital tract: Secondary | ICD-10-CM

## 2022-12-13 DIAGNOSIS — G8929 Other chronic pain: Secondary | ICD-10-CM

## 2022-12-13 MED ORDER — PREGABALIN 25 MG PO CAPS
25.0000 mg | ORAL_CAPSULE | Freq: Two times a day (BID) | ORAL | 0 refills | Status: AC
Start: 2022-12-13 — End: ?

## 2022-12-13 NOTE — Progress Notes (Signed)
Patient ID: Brandy Galloway, female    DOB: 1965/07/16  MRN: [id number]  CC: Foot Pain (On going foot pain /Mr.johnny reports not giving meds to pt this morning (forgot)/Yes to Tdap vax)   Subjective: Brandy Galloway is a 57 y.o. female who presents for UC visit.  Her uncle who is her guardian is with her.   Her concerns today include:  Patient with history of HTN, abnormal Pap smear with history of LGSIL, developmental delay since birth with microcephaly , seizure (only one time many yrs ago; not on med), anxiety, lumbar stenosis s/p laminectomy (Dr. Mikal Plane), chronic left-sided facial pain   Uncle did not bring medications to this visit.  Uncle gives the history. Patient had ingrown toenails removed from both big toes in April of this year by podiatry.  Since then she has had pain in both toes.  Uncle states that she cries out intermittently with pain.   -After she continued to have pain beyond the healing period, the podiatrist placed her on gabapentin for possible neuropathic pain.  Prescription was for gabapentin 300 mg 3 times a day.  Uncle states that did not help.  Lyrica 100 mg twice a day was then prescribed.  This made patient very somnolent to the point where he had trouble waking her up so he discontinued that.  Referred to neurologist Dr. Allena Katz.  She felt that the pain was not neuropathic.  Patient has been referred to [facility].  Patient has been seeing [facility] but has not been seen there since May.  Uncle states that he got sidetracked with trying to help the patient.  He purchased new pairs of tennis shoes for her.  He has been using Aspercreme which he purchased over-the-counter.  He states that he rubs this on several times a day and this seems to help some.  He has also been giving Tylenol. I asked patient today to show me where she is having pain.  She points to the left small toe.  However uncle states that both small toes have been causing pain.  Patient  is unable to describe the pain.  HTN: Blood pressure elevated.  Uncle tells me that he did not give her her medicines as yet for the morning.  She ate breakfast and then had to get her here for this appointment.  On Norvasc 10 mg, Hydralazine 50 mg TID and HCTZ 25 mg.  Does not check blood pressure.  Ca elevated on last visit.  Work up revealed Vit D level of 19.  I recommended OTC Vit D 400 international units  daily.  Uncle tells me that she is taking the vitamin D supplement.  HM:  due for Tdapt, colon CA screen and flu shot.  Cologuard ordered on last visit.  Specimen that was sent in was not acceptable for testing.  [employer] was suppose to contact her guardian to initiate a new sample collection.  Patient has history of abnormal Pap smear.  Last Pap done a few years ago by Dr. Jolayne Panther was LGSIL.  Patient was supposed to have colposcopy but they were never able to get in contact.  Patient Active Problem List   Diagnosis Date Noted   Jaw pain 12/29/2021   AKI (acute kidney injury) (HCC) 12/29/2021   HTN (hypertension) 12/29/2021   Spondylolisthesis of lumbar region 11/22/2020   Abnormal EKG 01/11/2020   Lumbar stenosis with neurogenic claudication 03/31/2019   Degenerative lumbar spinal stenosis 12/09/2018  Lumbar spondylosis 12/09/2018   Lumbar radiculopathy 12/09/2018   Chronic low back pain 10/07/2018   LGSIL on Pap smear of cervix    Postmenopausal vaginal bleeding    Low grade squamous intraepith lesion on cytologic smear cervix (lgsil) 07/12/2011     Current Outpatient Medications on File Prior to Visit  Medication Sig Dispense Refill   acetaminophen (TYLENOL) 500 MG tablet Take 1 tablet (500 mg total) by mouth every 6 (six) hours as needed. 60 tablet 1   amLODipine (NORVASC) 10 MG tablet Take 1 tablet (10 mg total) by mouth daily. 90 tablet 1   diclofenac Sodium (VOLTAREN) 1 % GEL Apply 2 g topically in the morning and at bedtime. 100 g 1   hydrALAZINE (APRESOLINE) 50  MG tablet Take 1.5 tablets (75 mg total) by mouth 3 (three) times daily. 135 tablet 6   hydrochlorothiazide (HYDRODIURIL) 25 MG tablet Take 1 tablet (25 mg total) by mouth every morning. 90 tablet 1   HYDROcodone-acetaminophen (NORCO/VICODIN) 5-325 MG tablet Take 1 tablet by mouth 3 (three) times daily.     ondansetron (ZOFRAN) 4 MG tablet Take 1 tablet (4 mg total) by mouth every 4 (four) hours as needed for nausea or vomiting. 12 tablet 0   triamcinolone cream (KENALOG) 0.5 % APPLY TOPICALLY TO THE AFFECTED AREA TWICE DAILY AS NEEDED 30 g 0   Vitamin D, Ergocalciferol, (DRISDOL) 1.25 MG (50000 UNIT) CAPS capsule Take 50,000 Units by mouth once a week.     pantoprazole (PROTONIX) 20 MG tablet Take 1 tablet (20 mg total) by mouth daily for 14 days. (Patient not taking: Reported on 10/07/2022) 14 tablet 0   No current facility-administered medications on file prior to visit.    No Known Allergies  Social History   Socioeconomic History   Marital status: Single    Spouse name: Not on file   Number of children: 0   Years of education: Not on file   Highest education level: Not on file  Occupational History   Not on file  Tobacco Use   Smoking status: Never   Smokeless tobacco: Never  Vaping Use   Vaping status: Never Used  Substance and Sexual Activity   Alcohol use: Never   Drug use: Never   Sexual activity: Never    Birth control/protection: None  Other Topics Concern   Not on file  Social History Narrative   08/01/21 Lives with her uncle who is her legal guardian, Jetta Lout.         Left Handed       Social Determinants of Health   Financial Resource Strain: Low Risk  (06/28/2022)   Overall Financial Resource Strain (CARDIA)    Difficulty of Paying Living Expenses: Not hard at all  Food Insecurity: No Food Insecurity (06/28/2022)   Hunger Vital Sign    Worried About Running Out of Food in the Last Year: Never true    Ran Out of Food in the Last Year: Never true   Transportation Needs: No Transportation Needs (06/28/2022)   PRAPARE - Administrator, Civil Service (Medical): No    Lack of Transportation (Non-Medical): No  Physical Activity: Inactive (06/28/2022)   Exercise Vital Sign    Days of Exercise per Week: 0 days    Minutes of Exercise per Session: 0 min  Stress: No Stress Concern Present (06/28/2022)   [employer] of Occupational Health - Occupational Stress Questionnaire    Feeling of Stress : Not at all  Social Connections: Socially Isolated (06/28/2022)   Social Connection and Isolation Panel [NHANES]    Frequency of Communication with Friends and Family: Never    Frequency of Social Gatherings with Friends and Family: More than three times a week    Attends Religious Services: Never    Database administrator or Organizations: Not on file    Attends Banker Meetings: Never    Marital Status: Never married  Intimate Partner Violence: Not At Risk (06/28/2022)   Humiliation, Afraid, Rape, and Kick questionnaire    Fear of Current or Ex-Partner: No    Emotionally Abused: No    Physically Abused: No    Sexually Abused: No    Family History  Problem Relation Age of Onset   Diabetes Mother    Diabetes Father     Past Surgical History:  Procedure Laterality Date   CERVICAL CONE BIOPSY  04/23/2007   CERVICAL CONIZATION W/BX  08/20/2011   Procedure: CONIZATION CERVIX WITH BIOPSY;  Surgeon: Allie Bossier, MD;  Location: [facility];  Service: Gynecology;  Laterality: N/A;  With Endocervical Currettage   COLPOSCOPY  06/21/2011   COLPOSCOPY N/A 05/28/2016   Procedure: COLPOSCOPY;  Surgeon: Catalina Antigua, MD;  Location: [facility];  Service: Gynecology;  Laterality: N/A;   DILATION AND CURETTAGE OF UTERUS N/A 05/28/2016   Procedure: DILATATION AND CURETTAGE;  Surgeon: Catalina Antigua, MD;  Location: [facility];  Service: Gynecology;  Laterality: N/A;   LEEP  04/23/2007   LUMBAR LAMINECTOMY/DECOMPRESSION MICRODISCECTOMY N/A  03/31/2019   Procedure: LAMINECTOMY LUMBAR THREE- LUMBAR FOUR, LUMBAR FOUR- LUMBAR FIVE;  Surgeon: Coletta Memos, MD;  Location: [facility];  Service: Neurosurgery;  Laterality: N/A;  LAMINECTOMY LUMBAR THREE- LUMBAR FOUR, LUMBAR FOUR- LUMBAR FIVE   MOUTH SURGERY Left 06/2021   Jaw surgery   RADIOLOGY WITH ANESTHESIA N/A 10/17/2020   Procedure: MRI WITH ANESTHESIA LUMBAR SPINE WITH AND WITHOUT CONTRAST;  Surgeon: Radiologist, Medication, MD;  Location: [facility];  Service: Radiology;  Laterality: N/A;   RADIOLOGY WITH ANESTHESIA N/A 11/06/2021   Procedure: MRI WITH ANESTHESIA OF BRAIN WITH AND WITHOUT CONTRAST, MR ANGIOGRAM OF HEAD WITHOUT CONTRAST;  Surgeon: Radiologist, Medication, MD;  Location: [facility];  Service: Radiology;  Laterality: N/A;   TOOTH EXTRACTION N/A 06/29/2021   Procedure: REMOVAL BILATERAL MANDIBULAR LINGUAL TORI;  Surgeon: Ocie Doyne, DMD;  Location: [facility];  Service: Oral Surgery;  Laterality: N/A;    ROS: Review of Systems Negative except as stated above  PHYSICAL EXAM: BP (!) 160/90   Pulse 78   Temp 98.2 F (36.8 C) (Oral)   Ht 5\' 8"  (1.727 m)   Wt 161 lb (73 kg)   LMP  (LMP Unknown)   SpO2 95%   BMI 24.48 kg/m   Physical Exam  General appearance -patient during the time that I was obtaining history from her uncle started crying and complaining of pain in the toes. Musculoskeletal -both fifth toes appear normal.  Looks like toenails have grown back.  There is no edema or erythema.  On palpation toes are not tender to touch.  No discoloration.      Latest Ref Rng & Units 07/18/2022   12:06 PM 07/03/2022    3:42 PM 07/02/2022    8:47 PM  CMP  Glucose 70 - 99 mg/dL  161  096   BUN 6 - 20 mg/dL  28  23   Creatinine 0.45 - 1.00 mg/dL  4.09  8.11   Sodium 914 - 145  mmol/L  136  136   Potassium 3.5 - 5.1 mmol/L  3.5  3.7   Chloride 98 - 111 mmol/L  94  96   CO2 22 - 32 mmol/L  30  26   Calcium 8.7 - 10.2 mg/dL 9.9  16.1  09.6   Total Protein 6.5 - 8.1 g/dL  9.3   9.0   Total Bilirubin 0.3 - 1.2 mg/dL  1.5  1.3   Alkaline Phos 38 - 126 U/L  102  117   AST 15 - 41 U/L  31  22   ALT 0 - 44 U/L  36  29    Lipid Panel     Component Value Date/Time   CHOL 193 05/23/2022 1601   TRIG 114 05/23/2022 1601   HDL 53 05/23/2022 1601   CHOLHDL 3.6 05/23/2022 1601   LDLCALC 120 (H) 05/23/2022 1601    CBC    Component Value Date/Time   WBC 6.7 07/03/2022 1542   RBC 4.74 07/03/2022 1542   HGB 13.9 07/03/2022 1542   HCT 41.3 07/03/2022 1542   PLT 336 07/03/2022 1542   MCV 87.1 07/03/2022 1542   MCH 29.3 07/03/2022 1542   MCHC 33.7 07/03/2022 1542   RDW 13.3 07/03/2022 1542   LYMPHSABS 2.2 07/03/2022 1542   MONOABS 0.5 07/03/2022 1542   EOSABS 0.2 07/03/2022 1542   BASOSABS 0.1 07/03/2022 1542    ASSESSMENT AND PLAN: 1. Chronic toe pain, bilateral It is unusual that she would have pain this along after having ingrown toenails removed. We went through similar episode last year when she had dental procedure done. I wonder whether toes or toenails are hypersensitive to friction from shoes Recommend getting some Dr. Margart Sickles small corn pads and putting them on both small toes. Will try her with smaller dose of Lyrica 25 mg twice a day.  Advised her uncle to give with Tylenol 500 mg twice a day.  Let me know if any increased drowsiness. Can also try pain rub from over-the-counter like Voltaren gel or Tiger balm instead of Aspercreme. I did through [facility] will see her, since she was seeing [facility] in past.  Told her uncle to give them a call to see if they would get her in.  I will submit referral to them. - pregabalin (LYRICA) 25 MG capsule; Take 1 capsule (25 mg total) by mouth 2 (two) times daily.  Dispense: 60 capsule; Refill: 0 - Ambulatory referral to Pain Clinic  2. Essential hypertension Not at goal.  Patient has not taken medicines as yet for the morning.  Advised the uncle to give her her medicines when she returns  home.  Follow-up in about 5 weeks for recheck.  3. Need for Tdap vaccination Agreeable to getting tetanus booster today.  Advised that it will cause some soreness at the injection site for several days.  4. History of abnormal cervical Pap smear - Ambulatory referral to Gynecology  5. Screening for colon cancer - Cologuard    Patient was given the opportunity to ask questions.  Patient verbalized understanding of the plan and was able to repeat key elements of the plan.   This documentation was completed using Paediatric nurse.  Any transcriptional errors are unintentional.  Orders Placed This Encounter  Procedures   Tdap vaccine greater than or equal to 7yo IM   Cologuard   Ambulatory referral to Pain Clinic   Ambulatory referral to Gynecology     Requested  Prescriptions   Signed Prescriptions Disp Refills   pregabalin (LYRICA) 25 MG capsule 60 capsule 0    Sig: Take 1 capsule (25 mg total) by mouth 2 (two) times daily.    Return in about 5 weeks (around 01/17/2023).  Brandy Blue, MD, FACP

## 2022-12-13 NOTE — Patient Instructions (Signed)
Blood pressure is elevated.  Please make sure that you give her her blood pressure medications as soon as she returns home.  Follow-up with me in 5 weeks for chronic disease management.  Please bring all medications to that visit.  I have resubmitted referral for her to follow-up with her pain specialist at [facility].  In the meantime, we will try her with low-dose of Lyrica 25 mg twice a day.  Please give with Tylenol 500 mg twice a day.  Purchase some Dr. Margart Sickles toe pads over-the-counter and put 1 on each big toe.  Can change it out about every 2 days.  Stop gabapentin.

## 2022-12-13 NOTE — Telephone Encounter (Signed)
Called & spoke to Brandy Galloway on 12/13/2022. Informed Brandy Galloway that a new referral has been sent to pain management to [facility]. Brandy Galloway expressed verbal understanding and will await call to schedule an appointment.

## 2022-12-16 ENCOUNTER — Ambulatory Visit: Payer: Self-pay

## 2022-12-16 NOTE — Telephone Encounter (Signed)
Summary: advice   Brandy Galloway is pt's legal guardian.  He said the nurse told him about some round things to put on pt's toes.  But he does not remember the particulars and would like a nurse to call him. He said the nurse wrote on a piece of paper, he has list but he is not sure which one  this is     Chief Complaint: Reviewed recommendations for toe pain. Verbalizes understanding. Symptoms: Corn pads and Voltaren gel OTC Frequency:  Pertinent Negatives: Patient denies  Disposition: [] ED /[] Urgent Care (no appt availability in office) / [] Appointment(In office/virtual)/ []  [facility]/ [] Home Care/ [] Refused Recommended Disposition /[] [facility]/ []  Follow-up with PCP Additional Notes: Verbalizes understanding.  Reason for Disposition  Caller has medicine question only, adult not sick, AND triager answers question  Answer Assessment - Initial Assessment Questions 1. NAME of MEDICINE: "What medicine(s) are you calling about?"     Asking for reminder of what was recommended for toe pain - corn pads and Voltaren gel OTC 2. QUESTION: "What is your question?" (e.g., double dose of medicine, side effect)     Above 3. PRESCRIBER: "Who prescribed the medicine?" Reason: if prescribed by specialist, call should be referred to that group.     Brandy Galloway  4. SYMPTOMS: "Do you have any symptoms?" If Yes, ask: "What symptoms are you having?"  "How bad are the symptoms (e.g., mild, moderate, severe)     Pain to toes 5. PREGNANCY:  "Is there any chance that you are pregnant?" "When was your last menstrual period?"     No  Protocols used: Medication Question Call-A-AH

## 2022-12-18 NOTE — Telephone Encounter (Signed)
Called & spoke to Mr.[name] (authorized to receive patient information per legal guardianship). Informed of [facility] information. Mr.[name] expressed verbal understanding. No further questions at this time.

## 2022-12-19 ENCOUNTER — Telehealth (INDEPENDENT_AMBULATORY_CARE_PROVIDER_SITE_OTHER): Payer: Self-pay | Admitting: Internal Medicine

## 2022-12-19 ENCOUNTER — Ambulatory Visit (INDEPENDENT_AMBULATORY_CARE_PROVIDER_SITE_OTHER): Payer: Medicaid Other | Admitting: Podiatry

## 2022-12-19 DIAGNOSIS — Z91199 Patient's noncompliance with other medical treatment and regimen due to unspecified reason: Secondary | ICD-10-CM

## 2022-12-19 NOTE — Progress Notes (Signed)
NS Please ask before Rescheduling

## 2022-12-19 NOTE — Telephone Encounter (Signed)
Copied from CRM [id number]. Topic: General - Other >> Dec 18, 2022  4:53 PM Everette C wrote: Reason for CRM: The patient's uncle has called to share that the patient's newly prescribed pregabalin (LYRICA) 25 MG capsule [[id number]] seems to be effective  The patient's uncle would like to be contacted further if needed

## 2023-01-15 ENCOUNTER — Other Ambulatory Visit: Payer: Self-pay

## 2023-01-15 NOTE — Progress Notes (Cosign Needed Addendum)
ANANDI SIRNA December 24, 1965 [id number]  Patient outreached by Thomasene Ripple , PharmD Candidate on 01/15/23. Talked with her guardian, Jetta Lout.    Blood Pressure Readings: Last documented ambulatory systolic blood pressure: 160 Last documented ambulatory diastolic blood pressure: 90 Does the patient have a validated home blood pressure machine?: No (will send script in)  Medication review was performed. Is the patient taking their medications as prescribed?: Yes   The following barriers to adherence were noted: Does the patient have cost concerns?: No Does the patient have transportation concerns?: No Does the patient need assistance obtaining refills?: No Does the patient occassionally forget to take some of their prescribed medications?: No Does the patient feel like one/some of their medications make them feel poorly?: No Does the patient have questions or concerns about their medications?: No Does the patient have a follow up scheduled with their primary care provider/cardiologist?: Yes   Interventions: Interventions Completed: Medications were reviewed, Patient was educated on proper technique to check home blood pressure and reminded to bring home machine and readings to next provider appointment, Patient was educated on how to access home blood pressure machine, Patient was counseled on lifestyle modifications to improve blood pressure, including DASH diet, exercise and healthy plate method.  Patient attended PCP visit 01/20/2023; her uncle (caretaker) purchased a BP cuff on his own. Clinical pharmacist demonstrated how to use at home BP device.   The patient has follow up scheduled: 01/20/23 PCP: Marcine Matar, MD   Thomasene Ripple, Student-PharmD

## 2023-01-17 ENCOUNTER — Other Ambulatory Visit: Payer: Self-pay

## 2023-01-17 ENCOUNTER — Telehealth: Payer: Self-pay | Admitting: Internal Medicine

## 2023-01-17 MED ORDER — MISC. DEVICES MISC: 0 refills | Status: DC

## 2023-01-17 NOTE — Telephone Encounter (Signed)
Pt Uncle [name] is calling to follow up on script for blood pressure cuff. Pharmacy-  [id number] - [place], [place] - [address] AT [facility] [address] Phone: [phone]  Fax: [phone]    [name]- [phone]

## 2023-01-17 NOTE — Telephone Encounter (Signed)
Order has been sent electronically.

## 2023-01-17 NOTE — Telephone Encounter (Signed)
Medication Refill - Medication: bp machine Shows sent at 2:20 today, but pt says its not there  Has the patient contacted their pharmacy? yes (Agent: If no, request that the patient contact the pharmacy for the refill. If patient does not wish to contact the pharmacy document the reason why and proceed with request.) (Agent: If yes, when and what did the pharmacy advise?)contact pcp  Preferred Pharmacy (with phone number or street name): [facility] #[id number] - [place], [place] - [address] AT [facility] OF [address] [place] [address] Phone: [phone]  Fax: [phone]   Has the patient been seen for an appointment in the last year OR does the patient have an upcoming appointment? yes  Agent: Please be advised that RX refills may take up to 3 business days. We ask that you follow-up with your pharmacy.

## 2023-01-19 ENCOUNTER — Other Ambulatory Visit: Payer: Self-pay | Admitting: Internal Medicine

## 2023-01-19 DIAGNOSIS — Z1211 Encounter for screening for malignant neoplasm of colon: Secondary | ICD-10-CM

## 2023-01-20 ENCOUNTER — Ambulatory Visit: Payer: Medicaid Other | Attending: Internal Medicine | Admitting: Internal Medicine

## 2023-01-20 ENCOUNTER — Encounter: Payer: Self-pay | Admitting: Internal Medicine

## 2023-01-20 VITALS — BP 190/84 | HR 74 | Temp 98.4°F | Ht 68.0 in | Wt 170.0 lb

## 2023-01-20 DIAGNOSIS — Z23 Encounter for immunization: Secondary | ICD-10-CM

## 2023-01-20 DIAGNOSIS — Z1211 Encounter for screening for malignant neoplasm of colon: Secondary | ICD-10-CM

## 2023-01-20 DIAGNOSIS — M79674 Pain in right toe(s): Secondary | ICD-10-CM

## 2023-01-20 DIAGNOSIS — M79675 Pain in left toe(s): Secondary | ICD-10-CM

## 2023-01-20 DIAGNOSIS — I10 Essential (primary) hypertension: Secondary | ICD-10-CM

## 2023-01-20 DIAGNOSIS — G8929 Other chronic pain: Secondary | ICD-10-CM | POA: Diagnosis not present

## 2023-01-20 MED ORDER — AMLODIPINE BESYLATE 10 MG PO TABS: 10.0000 mg | ORAL_TABLET | Freq: Every day | ORAL | 1 refills | Status: DC

## 2023-01-20 MED ORDER — HYDROCHLOROTHIAZIDE 25 MG PO TABS
25.0000 mg | ORAL_TABLET | Freq: Every morning | ORAL | 1 refills | Status: DC
Start: 2023-01-20 — End: 2023-08-29

## 2023-01-20 MED ORDER — PREGABALIN 25 MG PO CAPS
25.0000 mg | ORAL_CAPSULE | Freq: Two times a day (BID) | ORAL | 1 refills | Status: DC
Start: 2023-01-20 — End: 2023-02-05

## 2023-01-20 MED ORDER — HYDRALAZINE HCL 100 MG PO TABS: 100.0000 mg | ORAL_TABLET | Freq: Three times a day (TID) | ORAL | 1 refills | Status: DC

## 2023-01-20 NOTE — Patient Instructions (Signed)
Your blood pressure is not at goal.  Goal is 130/80 or lower.   We have increased hydralazine to 100 mg 3 times a day.    Check your blood pressure at least twice a week and record the readings.  Follow-up with the clinical pharmacist in 2 weeks for repeat blood pressure check.

## 2023-01-20 NOTE — Progress Notes (Signed)
Patient ID: Brandy Galloway, female    DOB: [birth year]  MRN: [id number]  CC: Hypertension (HTN f/u./Uncle brought in BP machine)   Subjective: Brandy Galloway is a 57 y.o. female who presents for chronic ds management.  Her uncle, [name], who is her guardian is with her and gives history.   Her concerns today include:  Patient with history of HTN, abnormal Pap smear with history of LGSIL, developmental delay since birth with microcephaly , seizure (only one time many yrs ago; not on med), anxiety, lumbar stenosis s/p laminectomy (Dr. Mikal Plane), chronic left-sided facial pain    Discussed the use of AI scribe software for clinical note transcription with the patient and her uncle, who gave verbal consent to proceed.  History of Present Illness   Brandy Galloway, a patient with a history of hypertension presents for a follow-up visit. The primary concern is her persistently elevated blood pressure despite being on hydrochlorothiazide 25mg  daily, Norvasc 10 mg daily and hydralazine 50mg  1.5  tabs three times a day. The patient's uncle, [name], who provides most of her care, reports that he has been administering the medications as prescribed. He also purchased a home blood pressure monitor, but is unsure about its operation. He notes that Brandy Galloway's blood pressure tends to rise when she becomes upset, often triggered by her desire to eat  sugar-free cookies daily which he tries to limit to just several days a wk.  On last visit she complained of pain in both small toes after nails were removed by podiatry several months prior.  We placed her on Lyrica 25 mg twice a day to take with Tylenol 500 mg twice a day.  I also recommended over-the-counter Voltaren gel.  However patient's uncle reports that he continue to use the Aspercreme and has been giving her the Lyrica and Tylenol.  Pain in the right fifth toe has resolved.  Still gets pain in the left fifth toe but not as bad.  He states that the toenail on the left  fifth toe is growing back and he thinks it is aggravating her.  He thinks the nail needs to be filed down.  Brandy Galloway also has a history of an abnormal Pap smear.  We resubmitted referral to gynecology on last visit but they have not yet received a call for a follow-up appointment with a gynecologist. She is due for a Cologuard test, and a new kit is expected to be sent to her.       Patient Active Problem List   Diagnosis Date Noted   Jaw pain 12/29/2021   AKI (acute kidney injury) (HCC) 12/29/2021   HTN (hypertension) 12/29/2021   Spondylolisthesis of lumbar region 11/22/2020   Abnormal EKG 01/11/2020   Lumbar stenosis with neurogenic claudication 03/31/2019   Degenerative lumbar spinal stenosis 12/09/2018   Lumbar spondylosis 12/09/2018   Lumbar radiculopathy 12/09/2018   Chronic low back pain 10/07/2018   LGSIL on Pap smear of cervix    Postmenopausal vaginal bleeding    Low grade squamous intraepith lesion on cytologic smear cervix (lgsil) 07/12/2011     Current Outpatient Medications on File Prior to Visit  Medication Sig Dispense Refill   acetaminophen (TYLENOL) 500 MG tablet Take 1 tablet (500 mg total) by mouth every 6 (six) hours as needed. 60 tablet 1   amLODipine (NORVASC) 10 MG tablet Take 1 tablet (10 mg total) by mouth daily. 90 tablet 1   diclofenac Sodium (VOLTAREN) 1 % GEL Apply 2 g topically in  the morning and at bedtime. 100 g 1   hydrALAZINE (APRESOLINE) 50 MG tablet Take 1.5 tablets (75 mg total) by mouth 3 (three) times daily. 135 tablet 6   hydrochlorothiazide (HYDRODIURIL) 25 MG tablet Take 1 tablet (25 mg total) by mouth every morning. 90 tablet 1   Misc. Devices MISC Blood Pressure machine 1 each 0   pregabalin (LYRICA) 25 MG capsule Take 1 capsule (25 mg total) by mouth 2 (two) times daily. 60 capsule 0   triamcinolone cream (KENALOG) 0.5 % APPLY TOPICALLY TO THE AFFECTED AREA TWICE DAILY AS NEEDED 30 g 0   Vitamin D, Ergocalciferol, (DRISDOL) 1.25 MG (50000  UNIT) CAPS capsule Take 50,000 Units by mouth once a week.     HYDROcodone-acetaminophen (NORCO/VICODIN) 5-325 MG tablet Take 1 tablet by mouth 3 (three) times daily. (Patient not taking: Reported on 01/20/2023)     ondansetron (ZOFRAN) 4 MG tablet Take 1 tablet (4 mg total) by mouth every 4 (four) hours as needed for nausea or vomiting. (Patient not taking: Reported on 01/20/2023) 12 tablet 0   pantoprazole (PROTONIX) 20 MG tablet Take 1 tablet (20 mg total) by mouth daily for 14 days. (Patient not taking: Reported on 10/07/2022) 14 tablet 0   No current facility-administered medications on file prior to visit.    No Known Allergies  Social History   Socioeconomic History   Marital status: Single    Spouse name: Not on file   Number of children: 0   Years of education: Not on file   Highest education level: Not on file  Occupational History   Not on file  Tobacco Use   Smoking status: Never   Smokeless tobacco: Never  Vaping Use   Vaping status: Never Used  Substance and Sexual Activity   Alcohol use: Never   Drug use: Never   Sexual activity: Never    Birth control/protection: None  Other Topics Concern   Not on file  Social History Narrative   08/01/21 Lives with her uncle who is her legal guardian, Jetta Lout.         Left Handed       Social Determinants of Health   Financial Resource Strain: Low Risk  (06/28/2022)   Overall Financial Resource Strain (CARDIA)    Difficulty of Paying Living Expenses: Not hard at all  Food Insecurity: No Food Insecurity (06/28/2022)   Hunger Vital Sign    Worried About Running Out of Food in the Last Year: Never true    Ran Out of Food in the Last Year: Never true  Transportation Needs: No Transportation Needs (06/28/2022)   PRAPARE - Administrator, Civil Service (Medical): No    Lack of Transportation (Non-Medical): No  Physical Activity: Inactive (06/28/2022)   Exercise Vital Sign    Days of Exercise per Week: 0 days     Minutes of Exercise per Session: 0 min  Stress: No Stress Concern Present (06/28/2022)   [employer] of Occupational Health - Occupational Stress Questionnaire    Feeling of Stress : Not at all  Social Connections: Socially Isolated (06/28/2022)   Social Connection and Isolation Panel [NHANES]    Frequency of Communication with Friends and Family: Never    Frequency of Social Gatherings with Friends and Family: More than three times a week    Attends Religious Services: Never    Database administrator or Organizations: Not on file    Attends Banker Meetings: Never  Marital Status: Never married  Intimate Partner Violence: Not At Risk (06/28/2022)   Humiliation, Afraid, Rape, and Kick questionnaire    Fear of Current or Ex-Partner: No    Emotionally Abused: No    Physically Abused: No    Sexually Abused: No    Family History  Problem Relation Age of Onset   Diabetes Mother    Diabetes Father     Past Surgical History:  Procedure Laterality Date   CERVICAL CONE BIOPSY  04/23/2007   CERVICAL CONIZATION W/BX  08/20/2011   Procedure: CONIZATION CERVIX WITH BIOPSY;  Surgeon: Allie Bossier, MD;  Location: [facility];  Service: Gynecology;  Laterality: N/A;  With Endocervical Currettage   COLPOSCOPY  06/21/2011   COLPOSCOPY N/A 05/28/2016   Procedure: COLPOSCOPY;  Surgeon: Catalina Antigua, MD;  Location: [facility];  Service: Gynecology;  Laterality: N/A;   DILATION AND CURETTAGE OF UTERUS N/A 05/28/2016   Procedure: DILATATION AND CURETTAGE;  Surgeon: Catalina Antigua, MD;  Location: [facility];  Service: Gynecology;  Laterality: N/A;   LEEP  04/23/2007   LUMBAR LAMINECTOMY/DECOMPRESSION MICRODISCECTOMY N/A 03/31/2019   Procedure: LAMINECTOMY LUMBAR THREE- LUMBAR FOUR, LUMBAR FOUR- LUMBAR FIVE;  Surgeon: Coletta Memos, MD;  Location: [facility];  Service: Neurosurgery;  Laterality: N/A;  LAMINECTOMY LUMBAR THREE- LUMBAR FOUR, LUMBAR FOUR- LUMBAR FIVE   MOUTH SURGERY Left 06/2021   Jaw  surgery   RADIOLOGY WITH ANESTHESIA N/A 10/17/2020   Procedure: MRI WITH ANESTHESIA LUMBAR SPINE WITH AND WITHOUT CONTRAST;  Surgeon: Radiologist, Medication, MD;  Location: [facility];  Service: Radiology;  Laterality: N/A;   RADIOLOGY WITH ANESTHESIA N/A 11/06/2021   Procedure: MRI WITH ANESTHESIA OF BRAIN WITH AND WITHOUT CONTRAST, MR ANGIOGRAM OF HEAD WITHOUT CONTRAST;  Surgeon: Radiologist, Medication, MD;  Location: [facility];  Service: Radiology;  Laterality: N/A;   TOOTH EXTRACTION N/A 06/29/2021   Procedure: REMOVAL BILATERAL MANDIBULAR LINGUAL TORI;  Surgeon: Ocie Doyne, DMD;  Location: [facility];  Service: Oral Surgery;  Laterality: N/A;    ROS: Review of Systems Negative except as stated above  PHYSICAL EXAM: BP (!) 190/84 (BP Location: Left Arm, Patient Position: Sitting, Cuff Size: Normal)   Pulse 74   Temp 98.4 F (36.9 C) (Oral)   Ht 5\' 8"  (1.727 m)   Wt 170 lb (77.1 kg)   LMP  (LMP Unknown)   SpO2 99%   BMI 25.85 kg/m   Physical Exam  General appearance - alert, well appearing, and in no distress Mental status -patient answers some questions yes or no but most of the history is obtained from her uncle. Chest - clear to auscultation, no wheezes, rales or rhonchi, symmetric air entry Heart - normal rate, regular rhythm, normal S1, S2, no murmurs, rubs, clicks or gallops Skin - LT foot 5th toe: Indeed the nail on this toe has grown back and is growing upward.      Latest Ref Rng & Units 07/18/2022   12:06 PM 07/03/2022    3:42 PM 07/02/2022    8:47 PM  CMP  Glucose 70 - 99 mg/dL  664  403   BUN 6 - 20 mg/dL  28  23   Creatinine 4.74 - 1.00 mg/dL  2.59  5.63   Sodium 875 - 145 mmol/L  136  136   Potassium 3.5 - 5.1 mmol/L  3.5  3.7   Chloride 98 - 111 mmol/L  94  96   CO2 22 - 32 mmol/L  30  26   Calcium  8.7 - 10.2 mg/dL 9.9  65.7  84.6   Total Protein 6.5 - 8.1 g/dL  9.3  9.0   Total Bilirubin 0.3 - 1.2 mg/dL  1.5  1.3   Alkaline Phos 38 - 126 U/L  102  117   AST 15  - 41 U/L  31  22   ALT 0 - 44 U/L  36  29    Lipid Panel     Component Value Date/Time   CHOL 193 05/23/2022 1601   TRIG 114 05/23/2022 1601   HDL 53 05/23/2022 1601   CHOLHDL 3.6 05/23/2022 1601   LDLCALC 120 (H) 05/23/2022 1601    CBC    Component Value Date/Time   WBC 6.7 07/03/2022 1542   RBC 4.74 07/03/2022 1542   HGB 13.9 07/03/2022 1542   HCT 41.3 07/03/2022 1542   PLT 336 07/03/2022 1542   MCV 87.1 07/03/2022 1542   MCH 29.3 07/03/2022 1542   MCHC 33.7 07/03/2022 1542   RDW 13.3 07/03/2022 1542   LYMPHSABS 2.2 07/03/2022 1542   MONOABS 0.5 07/03/2022 1542   EOSABS 0.2 07/03/2022 1542   BASOSABS 0.1 07/03/2022 1542    ASSESSMENT AND PLAN:    1. Essential hypertension Not at goal which is 130/80 or lower. Clinical pharmacist met with him today to teach [name] how to use the blood pressure device.  Advised to check blood pressure at least twice a week and record the readings.  We will have him follow-up with the clinical pharmacist in several weeks for recheck. Increase hydralazine to 100 mg 3 times a day.  Of the current bottle that she has of the 50 mg tablets, I explained to [facility] that he should give her 2 of them 3 times a day until she runs out of the current bottle. - amLODipine (NORVASC) 10 MG tablet; Take 1 tablet (10 mg total) by mouth daily.  Dispense: 90 tablet; Refill: 1 - hydrochlorothiazide (HYDRODIURIL) 25 MG tablet; Take 1 tablet (25 mg total) by mouth every morning.  Dispense: 90 tablet; Refill: 1 - hydrALAZINE (APRESOLINE) 100 MG tablet; Take 1 tablet (100 mg total) by mouth 3 (three) times daily.  Dispense: 270 tablet; Refill: 1  2. Chronic toe pain, bilateral Continue Lyrica and Tylenol twice a day.  Patient has had improvement.  [name] will call the podiatrist to schedule a follow-up appointment to have the toenail shaved on the left foot. - pregabalin (LYRICA) 25 MG capsule; Take 1 capsule (25 mg total) by mouth 2 (two) times daily.  Dispense: 60  capsule; Refill: 1  3. Screening for colon cancer Awaiting Cologuard kit which today should be receiving in the mail soon  4. Encounter for immunization Given today. - Flu vaccine trivalent PF, 6mos and older(Flulaval,Afluria,Fluarix,Fluzone)              Patient was given the opportunity to ask questions.  Patient verbalized understanding of the plan and was able to repeat key elements of the plan.   This documentation was completed using Paediatric nurse.  Any transcriptional errors are unintentional.  No orders of the defined types were placed in this encounter.    Requested Prescriptions   Pending Prescriptions Disp Refills   pregabalin (LYRICA) 25 MG capsule 60 capsule 0    Sig: Take 1 capsule (25 mg total) by mouth 2 (two) times daily.   amLODipine (NORVASC) 10 MG tablet 90 tablet 1    Sig: Take 1 tablet (10 mg total) by mouth  daily.   hydrochlorothiazide (HYDRODIURIL) 25 MG tablet 90 tablet 1    Sig: Take 1 tablet (25 mg total) by mouth every morning.    No follow-ups on file.  Jonah Blue, MD, FACP

## 2023-01-24 ENCOUNTER — Ambulatory Visit: Payer: Medicaid Other | Admitting: Internal Medicine

## 2023-01-27 ENCOUNTER — Ambulatory Visit (INDEPENDENT_AMBULATORY_CARE_PROVIDER_SITE_OTHER): Payer: Medicaid Other | Admitting: Podiatry

## 2023-01-27 ENCOUNTER — Encounter: Payer: Self-pay | Admitting: Podiatry

## 2023-01-27 DIAGNOSIS — M79675 Pain in left toe(s): Secondary | ICD-10-CM

## 2023-01-27 DIAGNOSIS — B351 Tinea unguium: Secondary | ICD-10-CM

## 2023-01-27 DIAGNOSIS — M79674 Pain in right toe(s): Secondary | ICD-10-CM

## 2023-01-27 NOTE — Progress Notes (Signed)
   Chief Complaint  Patient presents with   Nail Problem    5th toenail bilateral - thick nails left hurts, PCP recommended coming in to cut and file down, nails have been removed in the past    SUBJECTIVE Patient presents to office today complaining of elongated, thickened nails that cause pain while ambulating in shoes.  Patient is unable to trim their own nails. Patient is here for further evaluation and treatment.  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/2011   Mental retardation    Microcephaly (HCC)    Seizures (HCC)    last one more than 20 yrs ago   Seizures (HCC)    years ago; more than 20 years ago    No Known Allergies   OBJECTIVE General Patient is awake, alert, and oriented x 3 and in no acute distress. Derm Skin is dry and supple bilateral. Negative open lesions or macerations. Remaining integument unremarkable. Nails are tender, long, thickened and dystrophic with subungual debris, consistent with onychomycosis, 1-5 bilateral especially of the bilateral fifth toes. No signs of infection noted. Vasc  DP and PT pedal pulses palpable bilaterally. Temperature gradient within normal limits.  Neuro Epicritic and protective threshold sensation grossly intact bilaterally.  Musculoskeletal Exam No symptomatic pedal deformities noted bilateral. Muscular strength within normal limits.  ASSESSMENT 1.  Pain due to onychomycosis of toenails both  PLAN OF CARE -Patient evaluated today.  -Instructed to maintain good pedal hygiene and foot care.  -Mechanical debridement of nails 1-5 bilaterally performed using a nail nipper. Filed with dremel without incident.  -Return to clinic in 3 mos.    Felecia Shelling, DPM [facility]  Dr. Felecia Shelling, DPM    [address] [place], [place] [address]                 Office [phone]  Fax [phone]

## 2023-01-29 ENCOUNTER — Telehealth: Payer: Self-pay

## 2023-01-29 NOTE — Telephone Encounter (Signed)
Copied from CRM [id number]. Topic: General - Other >> Jan 29, 2023  3:59 PM Phill Myron wrote: Attn: Dr [name] [name] is calling, stating that you instructed him not to give Brandy Galloway a certain cookie. He would like to know what snacks do you want her to have. Because she is requesting sweets all the time.  Please advise

## 2023-01-30 NOTE — Telephone Encounter (Signed)
Fruits or nuts would be healthier snacks.  Okay to give a cookie but not every day.

## 2023-01-31 NOTE — Telephone Encounter (Signed)
Called & spoke to  Brandy Galloway (authorized to receive information per DPR on file). Informed of Dr.Johnson's recommendations. Brandy Galloway expressed verbal understanding.

## 2023-02-03 LAB — COLOGUARD

## 2023-02-04 ENCOUNTER — Ambulatory Visit: Payer: Self-pay

## 2023-02-04 NOTE — Telephone Encounter (Signed)
Summary: Left toe still in pain, nurse call back request   Left toe still in pain, pt's uncle wants to speak to a nurse.         Chief Complaint: Continued left pinky toe pain. Taking Lyrica and "tylenol in between." "She needs something else or a new cream." Symptoms: Pain Frequency: Om going Pertinent Negatives: Patient denies  Disposition: [] ED /[] Urgent Care (no appt availability in office) / [] Appointment(In office/virtual)/ []  [facility]/ [] Home Care/ [] Refused Recommended Disposition /[] [place] [facility]/ [x]  Follow-up with PCP Additional Notes: Please advise guardian.  Reason for Disposition  [1] MODERATE pain (e.g., interferes with normal activities, limping) AND [2] present > 3 days  Answer Assessment - Initial Assessment Questions 1. ONSET: "When did the pain start?"      For a long time 2. LOCATION: "Where is the pain located?"   (e.g., around nail, entire toe, at foot joint)      Left little toe 3. PAIN: "How bad is the pain?"    (Scale 1-10; or mild, moderate, severe)   -  MILD (1-3): doesn't interfere with normal activities    -  MODERATE (4-7): interferes with normal activities (e.g., work or school) or awakens from sleep, limping    -  SEVERE (8-10): excruciating pain, unable to do any normal activities, unable to walk     Moderate 4. APPEARANCE: "What does the toe look like?" (e.g., redness, swelling, bruising, pallor)     Looks fine 5. CAUSE: "What do you think is causing the toe pain?"     N/a 6. OTHER SYMPTOMS: "Do you have any other symptoms?" (e.g., leg pain, rash, fever, numbness)     No 7. PREGNANCY: "Is there any chance you are pregnant?" "When was your last menstrual period?"     No  Protocols used: Toe Pain-A-AH

## 2023-02-05 ENCOUNTER — Other Ambulatory Visit: Payer: Self-pay | Admitting: Internal Medicine

## 2023-02-05 DIAGNOSIS — G8929 Other chronic pain: Secondary | ICD-10-CM

## 2023-02-05 MED ORDER — PREGABALIN 25 MG PO CAPS
25.0000 mg | ORAL_CAPSULE | Freq: Two times a day (BID) | ORAL | 1 refills | Status: DC
Start: 2023-02-05 — End: 2023-04-17

## 2023-02-05 NOTE — Telephone Encounter (Signed)
We can increase Lyrica to 50 mg twice a day or he can call her pain specialist at [facility] to request a f/u appt with them.

## 2023-02-05 NOTE — Telephone Encounter (Signed)
Medication Refill - Medication:  pregabalin (LYRICA) 25 MG capsule  *8 Pills left  Has the patient contacted their pharmacy? Yes, advised to contact PCP office.  Preferred Pharmacy (with phone number or street name):  [id number] - [place], [place] - [address] AT [facility] OF [address] Phone: [phone]  Fax: [phone]   Has the patient been seen for an appointment in the last year OR does the patient have an upcoming appointment? Yes

## 2023-02-05 NOTE — Telephone Encounter (Signed)
Called & spoke to the patient. Verified name & DOB. Informed of Dr.Johnson's recommendations to Brandy Galloway, the patient's legal guardian. He expressed verbal understanding of all discussed and stated that he will continue on the 25 mg of Lyrica due to patient experiencing excessive drowsiness. Brandy Galloway expresses understanding to follow-up with [facility] for further pain concerns. No further questions at this time.

## 2023-02-05 NOTE — Telephone Encounter (Signed)
Called Mr.[name], patient's legal guardian, to go over results. Mr.johnny requested a refill of Lyrica. Stated that there are only 8 pills left at this time with no additional refills.

## 2023-02-07 ENCOUNTER — Telehealth: Payer: Self-pay

## 2023-02-07 NOTE — Telephone Encounter (Signed)
Copied from CRM [id number]. Topic: General - Inquiry >> Feb 07, 2023 10:47 AM De Blanch wrote: Reason for CRM: Brandy Galloway called to f/u on a new kit for Cologuard. Stated they have not received it yet and he needs it as soon as possible. He stated, "This "this chid is using the bathroom, but I don't have the kit.  Please advise.

## 2023-02-07 NOTE — Telephone Encounter (Signed)
Call placed to patient unable to reach or leave massage  Call to advise that cologaurd deliver could take up to 7-10 days

## 2023-02-10 NOTE — Telephone Encounter (Signed)
Called & spoke to the [name], the patient's legal guardian. He stated that he has already received the kit and sample is being mailed out today via [employer]. No further assistance needed at this time.

## 2023-02-13 LAB — COLOGUARD

## 2023-02-17 ENCOUNTER — Ambulatory Visit: Payer: Self-pay

## 2023-02-17 NOTE — Telephone Encounter (Signed)
Patient's legal guardian [name], states that he sent in a 2nd cologuard test, for patient and states that they would not give him any information from their department, he states they requested to speak directly to the patient herself and [name] let them know he was POA and they still would not give him any information. He was wanting to know could PCP call them and see what is going on, as far as her test. Please advise.Also, [name] is requesting a call back about this.

## 2023-02-17 NOTE — Telephone Encounter (Signed)
     Chief Complaint: Left pinky toe pain. Comes and goes. Moderate pain per guardian. Symptoms: Above Frequency: Weeks Pertinent Negatives: Patient denies  Disposition: [] ED /[] Urgent Care (no appt availability in office) / [x] Appointment(In office/virtual)/ []  [place] [facility]/ [] Home Care/ [] Refused Recommended Disposition /[] [place] [facility]/ [x]  Follow-up with PCP Additional Notes: Guardian will call [facility].  Reason for Disposition  [1] MODERATE pain (e.g., interferes with normal activities, limping) AND [2] present > 3 days  Answer Assessment - Initial Assessment Questions 1. ONSET: "When did the pain start?"      Several weeks 2. LOCATION: "Where is the pain located?"   (e.g., around nail, entire toe, at foot joint)      Left pinky toe pain 3. PAIN: "How bad is the pain?"    (Scale 1-10; or mild, moderate, severe)   -  MILD (1-3): doesn't interfere with normal activities    -  MODERATE (4-7): interferes with normal activities (e.g., work or school) or awakens from sleep, limping    -  SEVERE (8-10): excruciating pain, unable to do any normal activities, unable to walk     Moderate 4. APPEARANCE: "What does the toe look like?" (e.g., redness, swelling, bruising, pallor)     No 5. CAUSE: "What do you think is causing the toe pain?"     Unsure 6. OTHER SYMPTOMS: "Do you have any other symptoms?" (e.g., leg pain, rash, fever, numbness)     No 7. PREGNANCY: "Is there any chance you are pregnant?" "When was your last menstrual period?"     No  Protocols used: Toe Pain-A-AH

## 2023-02-18 ENCOUNTER — Ambulatory Visit: Payer: Medicaid Other | Attending: Internal Medicine | Admitting: Pharmacist

## 2023-02-18 ENCOUNTER — Encounter: Payer: Self-pay | Admitting: Pharmacist

## 2023-02-18 VITALS — BP 175/83

## 2023-02-18 DIAGNOSIS — I10 Essential (primary) hypertension: Secondary | ICD-10-CM | POA: Diagnosis not present

## 2023-02-18 NOTE — Progress Notes (Signed)
   S:     PCP: Dr. [name]  57 y.o. female who presents for hypertension evaluation, education, and management.  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  Patient was referred and last seen by Primary Care Provider, Dr. [name], on 01/20/2023. BP was 190/84 at that visit. Hydralazine dose was increased.  Today, patient arrives in spirits and presents with the assistance of her uncle/guardian [name].  Denies dizziness, headache, blurred vision, swelling. Patient reports hypertension is longstanding.   Of note, he guardian reports that he only has her taking hydralazine 100 mg TID for BP. She is not currently taking hydrochlorothiazide or amlodipine. I can confirm this via her dispense report as amlodipine was last dispensed for a 90-ds on 11/02/22 and hydrochlorothiazide was last dispensed for a 90-ds 05/23/2022.  Family/Social history:  -Fhx: DM -Tobacco: denies -Alcohol: denies  Current antihypertensives include: amlodipine 10mg  once daily (not taking), hydralazine 100 mg TID TID, HCTZ 25mg  (not taking)  Reported home BP readings: none given   Patient reported dietary habits:  -tries to cook at home and limits salt -will get take out every now and then  Patient-reported exercise habits:  -loves to dance   O:   Vitals:   02/18/23 1058  BP: (!) 175/83    Last 3 Office BP readings: BP Readings from Last 3 Encounters:  02/18/23 (!) 175/83  01/20/23 (!) 190/84  12/13/22 (!) 160/90    BMET    Component Value Date/Time   NA 136 07/03/2022 1542   NA 142 05/23/2022 1601   K 3.5 07/03/2022 1542   CL 94 (L) 07/03/2022 1542   CO2 30 07/03/2022 1542   GLUCOSE 121 (H) 07/03/2022 1542   BUN 28 (H) 07/03/2022 1542   BUN 14 05/23/2022 1601   CREATININE 1.21 (H) 07/03/2022 1542   CALCIUM 9.9  07/18/2022 1206   GFRNONAA 52 (L) 07/03/2022 1542   GFRAA >60 03/29/2019 1042    Renal function: CrCl cannot be calculated (Patient's most recent lab result is older than the maximum 21 days allowed.).  Patient is currently participating in a managed medicaid plan: yes    A/P: Hypertension diagnosed currently above goal on current medications. BP goal < 130/80 mmHg. Medication adherence is suboptimal. I have coordinated with their pharmacy for refills. Emphasized how critical medication adherence is and for pt to take her BP medications the day of her appt.   -Restart amlodipine 10mg  once daily. -Restart HCTZ 25mg  once daily -Continued hydralazine 100mg  TID  -Counseled on lifestyle modifications for blood pressure control including reduced dietary sodium, increased exercise, adequate sleep. -Encouraged patient to check BP at home and bring log of readings to next visit. Counseled on proper use of home BP cuff.    Results reviewed and written information provided.    Written patient instructions provided. Patient verbalized understanding of treatment plan.  Total time in face to face counseling 30 minutes.    Follow-up:  In 1 month with pharmacy.   Brandy Galloway, PharmD, Brandy Galloway, CPP Clinical Pharmacist [facility] [phone]

## 2023-02-20 NOTE — Telephone Encounter (Signed)
Call placed to patient unable to reach message left on VM.   

## 2023-03-04 DIAGNOSIS — R030 Elevated blood-pressure reading, without diagnosis of hypertension: Secondary | ICD-10-CM | POA: Diagnosis not present

## 2023-03-04 DIAGNOSIS — R519 Headache, unspecified: Secondary | ICD-10-CM | POA: Diagnosis not present

## 2023-03-04 DIAGNOSIS — Z6826 Body mass index (BMI) 26.0-26.9, adult: Secondary | ICD-10-CM | POA: Diagnosis not present

## 2023-03-04 DIAGNOSIS — Z79899 Other long term (current) drug therapy: Secondary | ICD-10-CM | POA: Diagnosis not present

## 2023-03-04 DIAGNOSIS — Z6827 Body mass index (BMI) 27.0-27.9, adult: Secondary | ICD-10-CM | POA: Diagnosis not present

## 2023-03-04 DIAGNOSIS — M79673 Pain in unspecified foot: Secondary | ICD-10-CM | POA: Diagnosis not present

## 2023-03-06 ENCOUNTER — Ambulatory Visit: Payer: Self-pay | Admitting: *Deleted

## 2023-03-06 DIAGNOSIS — Z79899 Other long term (current) drug therapy: Secondary | ICD-10-CM | POA: Diagnosis not present

## 2023-03-06 NOTE — Telephone Encounter (Signed)
  Chief Complaint: left "pinky toe" pain not relieved by medication per pt uncle on DPR Symptoms: left pinky toe pain "nerve" pain continues even while taking hydrocodone 1 tabl 3 x day. Limping at times  Frequency: on going  Pertinent Negatives: Patient denies redness no swelling no fever Disposition: [] ED /[] Urgent Care (no appt availability in office) / [] Appointment(In office/virtual)/ []  [place] [facility]/ [] Home Care/ [] Refused Recommended Disposition /[] [place] [facility]/ [x]  Follow-up with PCP Additional Notes:   Pt uncle reports pt just seen at Medical center for toe and medication not effective. Would like the "right type of medication" for patient . Pt does not c/o unless real pain occurs. Uncle requesting PCP review notes from Medical center regarding toe. Please advise if referral to a specialist will help patient. Uncle requesting call back.  Please advise if appt needed with PCP.       Reason for Disposition  Toe pain is a chronic symptom (recurrent or ongoing AND present > 4 weeks)  Answer Assessment - Initial Assessment Questions 1. ONSET: "When did the pain start?"      On going 2. LOCATION: "Where is the pain located?"   (e.g., around nail, entire toe, at foot joint)      Left "pinky toe" pain not relieved by hydrocodone 3. PAIN: "How bad is the pain?"    (Scale 1-10; or mild, moderate, severe)   -  MILD (1-3): doesn't interfere with normal activities    -  MODERATE (4-7): interferes with normal activities (e.g., work or school) or awakens from sleep, limping    -  SEVERE (8-10): excruciating pain, unable to do any normal activities, unable to walk     Can walk limping at times c/o pain  4. APPEARANCE: "What does the toe look like?" (e.g., redness, swelling, bruising, pallor)     No change in appearance  5. CAUSE: "What do you think is causing the toe pain?"     "Nerve" pain per pt uncle  6. OTHER SYMPTOMS: "Do you have any other symptoms?" (e.g., leg  pain, rash, fever, numbness)     Left pinky toe pain  7. PREGNANCY: "Is there any chance you are pregnant?" "When was your last menstrual period?"     na  Protocols used: Toe Pain-A-AH

## 2023-03-06 NOTE — Telephone Encounter (Addendum)
Call placed to patient unable to reach message  unable to leave  VM.  Call to advise the patient should call their pain clinic

## 2023-03-07 NOTE — Telephone Encounter (Signed)
Call placed to patient unable to reach message  unable to leave  VM.   Call was to advise the patient should call their pain clinic

## 2023-03-07 NOTE — Telephone Encounter (Signed)
If and when the uncle calls back, please let him know that I had submitted the referral to the pain clinic at [facility] where she was being seen before.  I submitted that back in August.  He can be given the phone number so that he can call and schedule an appointment for her.

## 2023-03-07 NOTE — Telephone Encounter (Signed)
Noted  

## 2023-03-13 ENCOUNTER — Ambulatory Visit: Payer: Self-pay

## 2023-03-13 ENCOUNTER — Telehealth: Payer: Self-pay

## 2023-03-13 NOTE — Telephone Encounter (Signed)
Summary: Left pinky toe pain- meds not helping   The Uncle called in stating the patient still complains of left pink toe pain. He said she has even taken pain meds and that doesn't seem to be helping too much. He states the provider a pain specialist at [facility] prescribed her Oxycodone  The Uncle states she has been using Astercreme and Voltaren but they are also barely helping her. [name] said he is also going to call [facility] as that is her foot doctor. Please assist patient further     Chief Complaint: Left pinky toe pain Symptoms: Above Frequency: Several months Pertinent Negatives: Patient denies  Disposition: [] ED /[] Urgent Care (no appt availability in office) / [] Appointment(In office/virtual)/ []  [place] [facility]/ [] Home Care/ [] Refused Recommended Disposition /[] [place] [facility]/ [x]  Follow-up with PCP Additional Notes: Uncle has contacted podiatry. Will call back as needed.  Reason for Disposition  [1] MODERATE pain (e.g., interferes with normal activities, limping) AND [2] present > 3 days  Answer Assessment - Initial Assessment Questions 1. ONSET: "When did the pain start?"      Several months 2. LOCATION: "Where is the pain located?"   (e.g., around nail, entire toe, at foot joint)      Pinky toe 3. PAIN: "How bad is the pain?"    (Scale 1-10; or mild, moderate, severe)   -  MILD (1-3): doesn't interfere with normal activities    -  MODERATE (4-7): interferes with normal activities (e.g., work or school) or awakens from sleep, limping    -  SEVERE (8-10): excruciating pain, unable to do any normal activities, unable to walk     Moderate 4. APPEARANCE: "What does the toe look like?" (e.g., redness, swelling, bruising, pallor)     No 5. CAUSE: "What do you think is causing the toe pain?"     Unsure 6. OTHER SYMPTOMS: "Do you have any other symptoms?" (e.g., leg pain, rash, fever, numbness)     No 7. PREGNANCY: "Is there any chance you  are pregnant?" "When was your last menstrual period?"     No  Protocols used: Toe Pain-A-AH

## 2023-03-13 NOTE — Telephone Encounter (Signed)
Patient's uncle called - patient has been to pain management, was seen by Dr. Rodena Medin. They have determined that part of the problem is nerve damage. The are asking for nerve medicine - pharmacy is [facility] thanks

## 2023-03-14 NOTE — Telephone Encounter (Signed)
Yeah her uncle called after hours yesterday at 10:30PM.  She is already on Lyrica.  There is nothing for [place] to prescribe.  She was also seen by pain management. I was having a hard time following the whole history. If anything she needs to come in for a follow-up appointment.  Thanks, Dr. Logan Bores

## 2023-04-03 ENCOUNTER — Ambulatory Visit: Payer: Self-pay

## 2023-04-03 NOTE — Telephone Encounter (Signed)
Please advise the uncle to touch base with her pain specialist at [facility].  Let them know that she is not tolerating the oxycodone to see if they would prescribe something else.

## 2023-04-03 NOTE — Telephone Encounter (Signed)
  Chief Complaint: toe pain Symptoms: L pinky toe pain, pt cries d/t pain at times.  Frequency: a while now  Pertinent Negatives: uncle denies redness or swelling  Disposition: [] ED /[] Urgent Care (no appt availability in office) / [] Appointment(In office/virtual)/ []  [place] [facility]/ [] Home Care/ [] Refused Recommended Disposition /[] [place] [facility]/ [x]  Follow-up with PCP Additional Notes: [name], pt's uncle states that he is giving pt Pregablin BID and not helping with pain. States that she cries in pain at times. Gave her the Oxycodone given from [facility] but pt c/o side pain so he stopped giving it to her after that. He has appt scheduled with [facility] on 04/30/23 for toe pain and getting nails cut back. He states no redness or swelling but toenail does look long and needing cut. He is asking if there is anything else that can be done to help pt with pain. Advised I would send message to provider for review and further recommendations.   Summary: Left pinky toe pain- meds not helping    Left pinky toe pain- meds still not helping.         Reason for Disposition  [1] MODERATE pain (e.g., interferes with normal activities, limping) AND [2] present > 3 days  Answer Assessment - Initial Assessment Questions 1. ONSET: "When did the pain start?"      Ongoing while now  2. LOCATION: "Where is the pain located?"   (e.g., around nail, entire toe, at foot joint)      L pinky toe  3. PAIN: "How bad is the pain?"    (Scale 1-10; or mild, moderate, severe)   -  MILD (1-3): doesn't interfere with normal activities    -  MODERATE (4-7): interferes with normal activities (e.g., work or school) or awakens from sleep, limping    -  SEVERE (8-10): excruciating pain, unable to do any normal activities, unable to walk     Moderate to severe pt crying in pain at times  4. APPEARANCE: "What does the toe look like?" (e.g., redness, swelling, bruising, pallor)     Toenail may  be too long  6. OTHER SYMPTOMS: "Do you have any other symptoms?" (e.g., leg pain, rash, fever, numbness)  Protocols used: Toe Pain-A-AH

## 2023-04-04 NOTE — Telephone Encounter (Signed)
Call placed to patient' uncle unable reach and VM was unavailable.     If patient returns call be  Advise per PCP"Please advise the uncle to touch base with her pain specialist at [facility]. Let them know that she is not tolerating the oxycodone to see if they would prescribe something else.

## 2023-04-16 ENCOUNTER — Other Ambulatory Visit: Payer: Self-pay | Admitting: Internal Medicine

## 2023-04-16 DIAGNOSIS — G8929 Other chronic pain: Secondary | ICD-10-CM

## 2023-04-18 ENCOUNTER — Telehealth: Payer: Self-pay

## 2023-04-18 NOTE — Telephone Encounter (Signed)
Brandy Galloway called in asking if we can call [facility] and check on Pregabalin 25 MG since it was not at pharmacy. Advised him I would call [facility] and give him a call back.   [facility] called and spoke to [employer] about the refill(s) pregabalin requested. Advised it was sent on 04/17/23 #60/5 refill(s). She states insurance is saying too soon for refill will approve on 04/21/23. Last refill was on 03/27/23. Was holding to speak with [facility] but line disconnected.   Called uncle [name] back, advised of above from pharmacy. He said that pharmacy told him if they could send in 50 MG they could refill that, advised him since office is closed and wont be back until Monday, not something I can refill d/t controlled substance. He states that he has been giving her 50 MG BID as he was told to do. I advised him current rx is for 25 MG BID. He said the 50 MG is more effective for her pain. I advised him I would send message to Dr. [name] to see if rx can be updated to correct dose and have nurse FU with him Monday. He said in the mean time he will give her the Oxycodone and just break in half to help with pain.

## 2023-04-21 NOTE — Telephone Encounter (Signed)
Noted  

## 2023-04-29 ENCOUNTER — Ambulatory Visit: Payer: Medicaid Other | Attending: Internal Medicine | Admitting: Internal Medicine

## 2023-04-29 ENCOUNTER — Encounter: Payer: Self-pay | Admitting: Internal Medicine

## 2023-04-29 VITALS — BP 151/78 | HR 104 | Ht 68.0 in | Wt 168.0 lb

## 2023-04-29 DIAGNOSIS — Z1211 Encounter for screening for malignant neoplasm of colon: Secondary | ICD-10-CM

## 2023-04-29 DIAGNOSIS — Z6825 Body mass index (BMI) 25.0-25.9, adult: Secondary | ICD-10-CM | POA: Diagnosis not present

## 2023-04-29 DIAGNOSIS — N1831 Chronic kidney disease, stage 3a: Secondary | ICD-10-CM | POA: Diagnosis not present

## 2023-04-29 DIAGNOSIS — E663 Overweight: Secondary | ICD-10-CM

## 2023-04-29 DIAGNOSIS — M79675 Pain in left toe(s): Secondary | ICD-10-CM

## 2023-04-29 DIAGNOSIS — G8929 Other chronic pain: Secondary | ICD-10-CM

## 2023-04-29 DIAGNOSIS — R7303 Prediabetes: Secondary | ICD-10-CM

## 2023-04-29 DIAGNOSIS — I10 Essential (primary) hypertension: Secondary | ICD-10-CM | POA: Diagnosis not present

## 2023-04-29 MED ORDER — VALSARTAN 40 MG PO TABS
40.0000 mg | ORAL_TABLET | Freq: Every day | ORAL | 3 refills | Status: DC
Start: 2023-04-29 — End: 2023-05-29

## 2023-04-29 MED ORDER — PREGABALIN 50 MG PO CAPS
50.0000 mg | ORAL_CAPSULE | Freq: Two times a day (BID) | ORAL | 3 refills | Status: DC
Start: 2023-04-29 — End: 2024-01-04

## 2023-04-29 NOTE — Patient Instructions (Signed)
 Your blood pressure is not at goal.  We have added another blood pressure medicine called valsartan  40 mg daily.  Continue amlodipine , hydrochlorothiazide  and hydralazine .  We will have you follow-up with our clinical pharmacist in 1 month for repeat blood pressure check.  Please bring all of your medications with you at that visit.  Please call us  when you get the Cologuard stool kit so that we can arrange for you to come and have our nurse show you how to collect it correctly.

## 2023-04-29 NOTE — Progress Notes (Signed)
 Patient ID: Brandy Galloway, female    DOB: 11/13/1965  MRN: [id number]  CC: Hypertension (HTN f/u. Med refill. Brandy Galloway on-going concern about toe - has appt tomorrow with specialist)   Subjective: Brandy Galloway is a 58 y.o. female who presents for chronic ds management. Her uncle, Brandy, who is her guardian is with her and gives history.   Her concerns today include:  Patient with history of HTN, abnormal Pap smear with history of LGSIL, developmental delay since birth with microcephaly , seizure (only one time many yrs ago; not on med), anxiety, lumbar stenosis s/p laminectomy (Dr. Lindalee), chronic left-sided facial pain     HTN/CKD: Since last visit with me, patient was seen by our clinical pharmacist the end of October.  She was restarted on amlodipine  10 mg daily, HCTZ 25 mg daily and continued with hydralazine  100 mg 3 times a day. Uncle forget to bring meds with him but was able to tell me that she is taking 3 BP meds one of which is 3 times a day. -Compliance: yes.  Did take all 3 meds this a.m -Checking blood pressure: 2-3 times a day.  Does not have log book.  Uncle tells me it runs 140-150s/60s.  BP elev when she gets upset or in pain with her toes -Last GFR in March of this year was 32 with creatinine of 1.21.  She is currently not being given any over-the-counter NSAIDs.  Major concern is ongoing issues with pain of the LT fifth toe.  On last visit we recommended continued Lyrica  25 mg twice a day and Tylenol  twice a day which uncle state was giving some relief for her.  Since then he has self increased the Lyrica  to 50 mg twice a day. Reports this dose helps her more.  Not in as much pain as before. -She has [facility] up [facility].  Vicodin 5/325 mg was prescribed number 15 tablets. Gave to her only one time and made her sleepy. Also causes constipation. Has not given her any more of that.  -nail has grown out some.  Has appt with Brandy Galloway tomorrow to have this filed down  Overwgh/PreDM: loves cookies.  In terms of fruits she likes grapes, apple, oranges but only ever other day. Uncle gives no more than 3 a day; zero sugar. Walks around in the back yard 3 times a day. Brandy Galloway   HM:  uncle defers on her getting Shingrix vaccine today.  Will consider on subsequent visit Patient Active Problem List   Diagnosis Date Noted   Stage 3a chronic kidney disease (HCC) 04/29/2023   Jaw pain 12/29/2021   AKI (acute kidney injury) (HCC) 12/29/2021   HTN (hypertension) 12/29/2021   Spondylolisthesis of lumbar region 11/22/2020   Abnormal EKG 01/11/2020   Lumbar stenosis with neurogenic claudication 03/31/2019   Degenerative lumbar spinal stenosis 12/09/2018   Lumbar spondylosis 12/09/2018   Lumbar radiculopathy 12/09/2018   Chronic low back pain 10/07/2018   LGSIL on Pap smear of cervix    Postmenopausal vaginal bleeding    Low grade squamous intraepith lesion on cytologic smear cervix (lgsil) 07/12/2011     Current Outpatient Medications on File Prior to Visit  Medication Sig Dispense Refill   amLODipine  (NORVASC ) 10 MG tablet Take 1 tablet (10 mg total) by mouth daily. 90 tablet 1   hydrALAZINE  (APRESOLINE ) 100 MG tablet Take 1 tablet (100 mg total) by mouth 3 (three) times daily. 270  tablet 1   triamcinolone  cream (KENALOG ) 0.5 % APPLY TOPICALLY TO THE AFFECTED AREA TWICE DAILY AS NEEDED 30 g 0   acetaminophen  (TYLENOL ) 500 MG tablet Take 1 tablet (500 mg total) by mouth every 6 (six) hours as needed. 60 tablet 1   hydrochlorothiazide  (HYDRODIURIL ) 25 MG tablet Take 1 tablet (25 mg total) by mouth every morning. 90 tablet 1   HYDROcodone -acetaminophen  (NORCO/VICODIN) 5-325 MG tablet Take 1 tablet by mouth 3 (three) times daily. (Patient not taking: Reported on 04/29/2023)     Misc. Devices MISC Blood Pressure machine 1 each 0   Vitamin D , Ergocalciferol , (DRISDOL) 1.25 MG (50000 UNIT) CAPS capsule Take 50,000 Units by mouth once a  week.     No current facility-administered medications on file prior to visit.    No Known Allergies  Social History   Socioeconomic History   Marital status: Single    Spouse name: Not on file   Number of children: 0   Years of education: Not on file   Highest education level: Not on file  Occupational History   Not on file  Tobacco Use   Smoking status: Never   Smokeless tobacco: Never  Vaping Use   Vaping status: Never Used  Substance and Sexual Activity   Alcohol use: Never   Drug use: Never   Sexual activity: Never    Birth control/protection: None  Other Topics Concern   Not on file  Social History Narrative   08/01/21 Lives with her uncle who is her legal guardian, Brandy Galloway.         Left Handed       Social Drivers of Health   Financial Resource Strain: Low Risk  (06/28/2022)   Overall Financial Resource Strain (CARDIA)    Difficulty of Paying Living Expenses: Not hard at all  Food Insecurity: No Food Insecurity (06/28/2022)   Hunger Vital Sign    Worried About Running Out of Food in the Last Year: Never true    Ran Out of Food in the Last Year: Never true  Transportation Needs: No Transportation Needs (06/28/2022)   PRAPARE - Administrator, Civil Service (Medical): No    Lack of Transportation (Non-Medical): No  Physical Activity: Inactive (06/28/2022)   Exercise Vital Sign    Days of Exercise per Week: 0 days    Minutes of Exercise per Session: 0 min  Stress: No Stress Concern Present (06/28/2022)   [facility]andy Galloway of Occupational [employer] - Occupational Stress Questionnaire    Feeling of Stress : Not at all  Social Connections: Socially Isolated (06/28/2022)   Social Connection and Isolation Panel [NHANES]    Frequency of Communication with Friends and Family: Never    Frequency of Social Gatherings with Friends and Family: More than three times a week    Attends Religious Services: Never    Database Administrator or Organizations: Not on  file    Attends Banker Meetings: Never    Marital Status: Never married  Intimate Partner Violence: Not At Risk (06/28/2022)   Humiliation, Afraid, Rape, and Kick questionnaire    Fear of Current or Ex-Partner: No    Emotionally Abused: No    Physically Abused: No    Sexually Abused: No    Family History  Problem Relation Age of Onset   Diabetes Mother    Diabetes Father     Past Surgical History:  Procedure Laterality Date   CERVICAL CONE BIOPSY  04/23/2007  CERVICAL CONIZATION W/BX  08/20/2011   Procedure: CONIZATION CERVIX WITH BIOPSY;  Surgeon: Harland JAYSON Birkenhead, MD;  Location: [facility];  Service: Gynecology;  Laterality: N/A;  With Endocervical Currettage   COLPOSCOPY  06/21/2011   COLPOSCOPY N/A 05/28/2016   Procedure: COLPOSCOPY;  Surgeon: Winton Felt, MD;  Location: [facility];  Service: Gynecology;  Laterality: N/A;   DILATION AND CURETTAGE OF UTERUS N/A 05/28/2016   Procedure: DILATATION AND CURETTAGE;  Surgeon: Winton Felt, MD;  Location: [facility];  Service: Gynecology;  Laterality: N/A;   LEEP  04/23/2007   LUMBAR LAMINECTOMY/DECOMPRESSION MICRODISCECTOMY N/A 03/31/2019   Procedure: LAMINECTOMY LUMBAR THREE- LUMBAR FOUR, LUMBAR FOUR- LUMBAR FIVE;  Surgeon: Gillie Duncans, MD;  Location: [facility];  Service: Neurosurgery;  Laterality: N/A;  LAMINECTOMY LUMBAR THREE- LUMBAR FOUR, LUMBAR FOUR- LUMBAR FIVE   MOUTH SURGERY Left 06/2021   Jaw surgery   RADIOLOGY WITH ANESTHESIA N/A 10/17/2020   Procedure: MRI WITH ANESTHESIA LUMBAR SPINE WITH AND WITHOUT CONTRAST;  Surgeon: Radiologist, Medication, MD;  Location: [facility];  Service: Radiology;  Laterality: N/A;   RADIOLOGY WITH ANESTHESIA N/A 11/06/2021   Procedure: MRI WITH ANESTHESIA OF BRAIN WITH AND WITHOUT CONTRAST, MR ANGIOGRAM OF HEAD WITHOUT CONTRAST;  Surgeon: Radiologist, Medication, MD;  Location: [facility];  Service: Radiology;  Laterality: N/A;   TOOTH EXTRACTION N/A 06/29/2021   Procedure: REMOVAL BILATERAL  MANDIBULAR LINGUAL TORI;  Surgeon: Sheryle Hamilton, DMD;  Location: [facility];  Service: Oral Surgery;  Laterality: N/A;    ROS: Review of Systems Negative except as stated above  PHYSICAL EXAM: BP (!) 151/78   Pulse (!) 104   Ht 5' 8 (1.727 m)   Wt 168 lb (76.2 kg)   LMP  (LMP Unknown)   SpO2 99%   BMI 25.54 kg/m   Wt Readings from Last 3 Encounters:  04/29/23 168 lb (76.2 kg)  01/20/23 170 lb (77.1 kg)  12/13/22 161 lb (73 kg)    Physical Exam  General appearance -middle-age to older African-American female in NAD. Mental status -patient is alert.  History is obtained from her uncle Chest - clear to auscultation, no wheezes, rales or rhonchi, symmetric air entry Heart - normal rate, regular rhythm, normal S1, S2, no murmurs, rubs, clicks or gallops Extremities -no lower extremity edema Skin -left fifth toe.  Toenail is thick.  It does not appear overgrown at this time.      Latest Ref Rng & Units 07/18/2022   12:06 PM 07/03/2022    3:42 PM 07/02/2022    8:47 PM  CMP  Glucose 70 - 99 mg/dL  878  872   BUN 6 - 20 mg/dL  28  23   Creatinine 9.55 - 1.00 mg/dL  8.78  8.69   Sodium 864 - 145 mmol/L  136  136   Potassium 3.5 - 5.1 mmol/L  3.5  3.7   Chloride 98 - 111 mmol/L  94  96   CO2 22 - 32 mmol/L  30  26   Calcium  8.7 - 10.2 mg/dL 9.9  89.1  89.7   Total Protein 6.5 - 8.1 g/dL  9.3  9.0   Total Bilirubin 0.3 - 1.2 mg/dL  1.5  1.3   Alkaline Phos 38 - 126 U/L  102  117   AST 15 - 41 U/L  31  22   ALT 0 - 44 U/L  36  29    Lipid Panel     Component Value Date/Time   CHOL  193 05/23/2022 1601   TRIG 114 05/23/2022 1601   HDL 53 05/23/2022 1601   CHOLHDL 3.6 05/23/2022 1601   LDLCALC 120 (H) 05/23/2022 1601    CBC    Component Value Date/Time   WBC 6.7 07/03/2022 1542   RBC 4.74 07/03/2022 1542   HGB 13.9 07/03/2022 1542   HCT 41.3 07/03/2022 1542   PLT 336 07/03/2022 1542   MCV 87.1 07/03/2022 1542   MCH 29.3 07/03/2022 1542   MCHC 33.7 07/03/2022 1542    RDW 13.3 07/03/2022 1542   LYMPHSABS 2.2 07/03/2022 1542   MONOABS 0.5 07/03/2022 1542   EOSABS 0.2 07/03/2022 1542   BASOSABS 0.1 07/03/2022 1542    ASSESSMENT AND PLAN: 1. Essential hypertension (Primary) Not at goal. Encouraged the uncle to continue giving her current medications including hydralazine  100 mg 3 times a day, HCTZ 25 mg daily and amlodipine  10 mg daily.  Add Diovan  40 mg daily.  We will need to check chemistry today. Follow-up with clinical pharmacist in 1 month for recheck of blood pressure.  Advised to bring all medications to that visit.  Continue to monitor blood pressure with goal being 130/80 or lower. - CBC - Comprehensive metabolic panel - Lipid panel - valsartan  (DIOVAN ) 40 MG tablet; Take 1 tablet (40 mg total) by mouth daily.  Dispense: 30 tablet; Refill: 3  2. Chronic toe pain, LT 5th Uncle is preoccupied with this.  He reports she gets greater relief with Lyrica  50 mg so I have increased the dose to 50 mg twice a day.  Looking at the toenail, it does not appear that it needs to be filed down again at this time. - pregabalin  (LYRICA ) 50 MG capsule; Take 1 capsule (50 mg total) by mouth 2 (two) times daily.  Dispense: 60 capsule; Refill: 3  3. Stage 3a chronic kidney disease (HCC) Recheck chemistry today.  4. Overweight (BMI 25.0-29.9) 5. Prediabetes Encouraged uncle to try to give her healthy food choices.  Encouraged patient to eat at least 2-3 servings of fruits a day.  Continue daily walks - Hemoglobin A1c - Lipid panel  6. Screening for colon cancer I have requested Cologuard kit for them again.  This is the third or fourth try.  Advised the uncle that once they receive the kit he can call and make an appointment with our RN so that she can go over with them proper collection. - Cologuard    Patient was given the opportunity to ask questions.  Patient verbalized understanding of the plan and was able to repeat key elements of the plan.   This  documentation was completed using Paediatric nurse.  Any transcriptional errors are unintentional.  Orders Placed This Encounter  Procedures   Cologuard   CBC   Comprehensive metabolic panel   Hemoglobin A1c   Lipid panel     Requested Prescriptions   Signed Prescriptions Disp Refills   pregabalin  (LYRICA ) 50 MG capsule 60 capsule 3    Sig: Take 1 capsule (50 mg total) by mouth 2 (two) times daily.   valsartan  (DIOVAN ) 40 MG tablet 30 tablet 3    Sig: Take 1 tablet (40 mg total) by mouth daily.    Return in about 4 months (around 08/27/2023) for Appt with [facility] in 4 wks for BP check.  Brandy Louder, MD, FACP

## 2023-04-30 ENCOUNTER — Ambulatory Visit (INDEPENDENT_AMBULATORY_CARE_PROVIDER_SITE_OTHER): Payer: Medicaid Other | Admitting: Podiatry

## 2023-04-30 ENCOUNTER — Encounter: Payer: Self-pay | Admitting: Podiatry

## 2023-04-30 ENCOUNTER — Telehealth: Payer: Self-pay | Admitting: Internal Medicine

## 2023-04-30 DIAGNOSIS — B351 Tinea unguium: Secondary | ICD-10-CM

## 2023-04-30 DIAGNOSIS — M79675 Pain in left toe(s): Secondary | ICD-10-CM | POA: Diagnosis not present

## 2023-04-30 DIAGNOSIS — M792 Neuralgia and neuritis, unspecified: Secondary | ICD-10-CM

## 2023-04-30 DIAGNOSIS — M79674 Pain in right toe(s): Secondary | ICD-10-CM

## 2023-04-30 DIAGNOSIS — M216X1 Other acquired deformities of right foot: Secondary | ICD-10-CM | POA: Diagnosis not present

## 2023-04-30 LAB — COMPREHENSIVE METABOLIC PANEL
ALT: 16 [IU]/L (ref 0–32)
AST: 18 [IU]/L (ref 0–40)
Albumin: 4.7 g/dL (ref 3.8–4.9)
Alkaline Phosphatase: 129 [IU]/L — ABNORMAL HIGH (ref 44–121)
BUN/Creatinine Ratio: 16 (ref 9–23)
BUN: 16 mg/dL (ref 6–24)
Bilirubin Total: 0.6 mg/dL (ref 0.0–1.2)
CO2: 25 mmol/L (ref 20–29)
Calcium: 10.2 mg/dL (ref 8.7–10.2)
Chloride: 100 mmol/L (ref 96–106)
Creatinine, Ser: 0.97 mg/dL (ref 0.57–1.00)
Globulin, Total: 3.1 g/dL (ref 1.5–4.5)
Glucose: 98 mg/dL (ref 70–99)
Potassium: 4.2 mmol/L (ref 3.5–5.2)
Sodium: 142 mmol/L (ref 134–144)
Total Protein: 7.8 g/dL (ref 6.0–8.5)
eGFR: 68 mL/min/{1.73_m2} (ref >59)

## 2023-04-30 LAB — CBC
Hematocrit: 39.5 % (ref 34.0–46.6)
Hemoglobin: 13 g/dL (ref 11.1–15.9)
MCH: 29.7 pg (ref 26.6–33.0)
MCHC: 32.9 g/dL (ref 31.5–35.7)
MCV: 90 fL (ref 79–97)
Platelets: 283 10*3/uL (ref 150–450)
RBC: 4.37 x10E6/uL (ref 3.77–5.28)
RDW: 12.9 % (ref 11.7–15.4)
WBC: 5 10*3/uL (ref 3.4–10.8)

## 2023-04-30 LAB — LIPID PANEL
Chol/HDL Ratio: 3.5 {ratio} (ref 0.0–4.4)
Cholesterol, Total: 242 mg/dL — ABNORMAL HIGH (ref 100–199)
HDL: 69 mg/dL (ref >39)
LDL Chol Calc (NIH): 156 mg/dL — ABNORMAL HIGH (ref 0–99)
Triglycerides: 96 mg/dL (ref 0–149)
VLDL Cholesterol Cal: 17 mg/dL (ref 5–40)

## 2023-04-30 LAB — HEMOGLOBIN A1C
Est. average glucose Bld gHb Est-mCnc: 123 mg/dL
Hgb A1c MFr Bld: 5.9 % — ABNORMAL HIGH (ref 4.8–5.6)

## 2023-04-30 NOTE — Telephone Encounter (Signed)
 Called & spoke to Brandy Galloway and she stated that yesterday's order was cancelled due to the order from September 2024 is still active. The representative stated that there has been a total of 5 kits that have been sent to the patient & all have been unsuccessfully collected. Brandy Galloway stated that the main issue is being unable to get  the date & time as per the instructions. It looks like the last 3 samples have had the same issues.  Brandy Galloway had to speak to leadership and leadership stated that they are going to send out one last kit. The kit will be sent to the provider office to assist patient with collection and labeling. Otherwise if collection is unsuccessful the lab will recommend that another form of testing be used.    Called Brandy Galloway, the patient's guardian, but no answer. Unable to LVM due to continuous ringing. Please schedule a nurse appointment in 2 weeks to have them come in for cologuard teaching.

## 2023-04-30 NOTE — Telephone Encounter (Signed)
 Order was placed yesterday when I saw the patient for another Cologuard test.  Patient had failed and collecting the stool specimen correctly x 2 in the past year.  They were supposed to send another kit but did not so I placed the order and instructed patient's caregiver to call us  once they receive the kit so that we can have her come as a nurse only visit to assist with proper collection/following instructions.  I see that [employer] has canceled the order.  Can you please call them and inquire about this and whether they intend to send another kit or not? [phone].

## 2023-04-30 NOTE — Progress Notes (Signed)
 Let patient's uncle, Garen, no that her kidney function has improved compared to 1 year ago.  Liver function tests are good. Still in the range for prediabetes.  Avoid sugary sweets and sweet drinks like regular sodas, juices and sweet tea. Cholesterol levels elevated compared to 1 year ago.  High cholesterol increases risk for heart attack and strokes. I recommend starting medication called atorvastatin  to help lower cholesterol.  Medication can sometimes cause muscle cramps and can sometimes affect the liver.  We do check liver function test intermittently while patients are on this medicine.  Let me know if he is welling to have her start the medication then I can send prescription to her pharmacy. Blood cell counts are normal.  Rest of this is for my information The 10-year ASCVD risk score (Arnett DK, et al., 2019) is: 10%   Values used to calculate the score:     Age: 58 years     Sex: Female     Is Non-Hispanic African American: Yes     Diabetic: No     Tobacco smoker: No     Systolic Blood Pressure: 151 mmHg     Is BP treated: Yes     HDL Cholesterol: 69 mg/dL     Total Cholesterol: 242 mg/dL

## 2023-04-30 NOTE — Progress Notes (Signed)
   Chief Complaint  Patient presents with   Toe Pain    Patient had two toe nail removal on bilateral hallux and she has been having pain in her left hallux , every since her last visit of her when got her toe nails cut down she believes it was cut down to far.  Patient husband has been putting cream on her toe ( left 5th toe). Patient has been taking medication for pain.    SUBJECTIVE Patient presents to office today complaining of elongated, thickened nails that cause pain while ambulating in shoes.  Patient is unable to trim their own nails. Patient is here for further evaluation and treatment.  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/2011   Mental retardation    Microcephaly (HCC)    Seizures (HCC)    last one more than 20 yrs ago   Seizures (HCC)    years ago; more than 20 years ago    No Known Allergies   OBJECTIVE General Patient is awake, alert, and oriented x 3 and in no acute distress. Derm Skin is dry and supple bilateral. Negative open lesions or macerations. Remaining integument unremarkable. Nails are tender, long, thickened and dystrophic with subungual debris, consistent with onychomycosis, 1-5 bilateral. No signs of infection noted. Vasc  DP and PT pedal pulses palpable bilaterally. Temperature gradient within normal limits.  Neuro Epicritic and protective threshold sensation grossly intact bilaterally.  Musculoskeletal Exam No symptomatic pedal deformities noted bilateral. Muscular strength within normal limits.  ASSESSMENT 1.  Pain due to onychomycosis of toenails both 2.  Neuropathic pain  PLAN OF CARE 1. Patient evaluated today.  2. Instructed to maintain good pedal hygiene and foot care.  3. Mechanical debridement of nails 1-5 bilaterally performed using a nail nipper. Filed with dremel without incident.  4.  Continue oral  gabapentin  from PCP.  Husband states that she is currently on 50 mg 3 times daily.  5.  Return to clinic in 3 mos. routine footcare   Thresa EMERSON Sar, DPM [facility]  Dr. Thresa EMERSON Sar, DPM    [address] [place], [place] [address]                Office [phone]  Fax [phone]

## 2023-05-02 NOTE — Telephone Encounter (Signed)
 Called [name] to schedule a nurse visit but no answer. Unable to LVM due to continuous ringing.

## 2023-05-05 NOTE — Telephone Encounter (Signed)
 Third attempt. Called but no answer. Unable to LVM due to continuous ringing. Letter sent via mail to call and schedule a nurse appointment.

## 2023-05-06 NOTE — Telephone Encounter (Signed)
 Cologuard kit received 05/05/2023. Awaiting call from Mr.[name] to call and schedule a nurse appointment.

## 2023-05-07 ENCOUNTER — Other Ambulatory Visit: Payer: Self-pay | Admitting: Internal Medicine

## 2023-05-07 MED ORDER — ATORVASTATIN CALCIUM 10 MG PO TABS: 10.0000 mg | ORAL_TABLET | Freq: Every day | ORAL | 1 refills | Status: DC

## 2023-05-07 NOTE — Telephone Encounter (Signed)
 Nurse appointment scheduled for 05/13/2023 to receive Cologuard teaching. Brandy Galloway confirmed appointment.

## 2023-05-13 ENCOUNTER — Ambulatory Visit: Payer: Medicaid Other | Attending: Internal Medicine

## 2023-05-20 LAB — COLOGUARD

## 2023-05-21 NOTE — Progress Notes (Signed)
Let patient's uncle, [name], know that the Cologuard test could not be processed by the lab.  We will need to discuss other options for colon cancer screening on next visit.

## 2023-05-28 NOTE — Progress Notes (Signed)
 S:     PCP: Dr. Vicci  58 y.o. female who presents for hypertension evaluation, education, and management.   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   Patient was referred and last seen by Primary Care Provider, Dr. Vicci, on 04/29/2023. BP was 151/70 at that visit. Valsartan  40 mg daily was added. Her guardian reported adherence for all her current BP medication.  We last saw her on 02/18/23. BP that visit was 175/83. At that visit, her guardian reported that he only has her taking hydralazine  100 mg TID for BP. She was not taking hydrochlorothiazide  or amlodipine .  Patient reports hypertension is longstanding.   Today, patient arrives in spirits and presents with the assistance of her uncle/guardian [name]. Denies dizziness, headache, blurred vision, swelling. Guardian reports valsartan , amlodipine , and HCTZ taken 40 min prior to appointment.  Family/Social history:  -Fhx: DM -Tobacco: denies -Alcohol: denies  Current antihypertensives: amlodipine  10mg  once daily, hydralazine  100 mg TID, HCTZ 25 mg, valsartan  40 mg daily  Current hyperlipidemia medications: atorvastatin  10 mg  Reported home BP readings: none recorded  Patient reported dietary habits: 3 meals/days Breakfast: whole wheat bread, eggs, jam Lunch: salmon, green peas, rice, string beans Dinner: chicken (baked), macaroni cheese, strong cheese, cornbreads Snacks: zero sugar cookies Drinks: apple juice (2 glasses), water  Patient-reported exercise habits:  -neighborhood/backyard walks  O:   Vitals:   05/29/23 1400  BP: (!) 152/72  Pulse: 80   Last 3 Office BP readings: BP Readings from Last 3 Encounters:  05/29/23 (!) 152/72  04/29/23 (!) 151/78  02/18/23 (!) 175/83   BMET    Component Value Date/Time   NA 142  04/29/2023 1157   K 4.2 04/29/2023 1157   CL 100 04/29/2023 1157   CO2 25 04/29/2023 1157   GLUCOSE 98 04/29/2023 1157   GLUCOSE 121 (H) 07/03/2022 1542   BUN 16 04/29/2023 1157   CREATININE 0.97 04/29/2023 1157   CALCIUM  10.2 04/29/2023 1157   GFRNONAA 52 (L) 07/03/2022 1542   GFRAA >60 03/29/2019 1042   Renal function: CrCl cannot be calculated (Patient's most recent lab result is older than the maximum 21 days allowed.).  The 10-year ASCVD risk score (Arnett DK, et al., 2019) is: 10%   Values used to calculate the score:     Age: 79 years     Sex: Female     Is Non-Hispanic African American: Yes     Diabetic: No     Tobacco smoker: No     Systolic Blood Pressure: 151 mmHg     Is BP treated: Yes     HDL Cholesterol: 69 mg/dL     Total Cholesterol: 242 mg/dL  Patient is currently participating in a managed medicaid plan: yes   A/P: Hypertension diagnosed currently not at goal on current medications. BP goal < 130/80 mmHg. Medication adherence is optimal. -Continue amlodipine  10mg  once daily. -Continue HCTZ 25mg  once daily -Continue hydralazine  100mg  TID  -Increased valsartan  to 80 mg once daily -Labs CMP14+eGFR -Counseled on lifestyle modifications for blood pressure control including reduced dietary sodium, increased exercise, adequate sleep. -Encouraged patient to check BP at home and bring log of readings to next visit. Counseled on proper use of home BP cuff.   Results reviewed and written information provided.    Written patient instructions provided. Patient  verbalized understanding of treatment plan.  Total time in face to face counseling 30 minutes.    Follow-up:  PCP: 08/29/23 Pharmacy: 1 month  Seen By: Margretta Fear, PharmD Candidate [employer]   Herlene Fleeta Morris, PharmD, [place], CPP Clinical Pharmacist [facility] [phone]

## 2023-05-29 ENCOUNTER — Encounter: Payer: Self-pay | Admitting: Pharmacist

## 2023-05-29 ENCOUNTER — Ambulatory Visit: Payer: Medicaid Other | Attending: Internal Medicine | Admitting: Pharmacist

## 2023-05-29 VITALS — BP 152/72 | HR 80

## 2023-05-29 DIAGNOSIS — I10 Essential (primary) hypertension: Secondary | ICD-10-CM

## 2023-05-29 MED ORDER — VALSARTAN 80 MG PO TABS: 80.0000 mg | ORAL_TABLET | Freq: Every day | ORAL | 3 refills | Status: DC

## 2023-05-30 LAB — CMP14+EGFR
ALT: 27 [IU]/L (ref 0–32)
AST: 20 [IU]/L (ref 0–40)
Albumin: 4.7 g/dL (ref 3.8–4.9)
Alkaline Phosphatase: 129 [IU]/L — ABNORMAL HIGH (ref 44–121)
BUN/Creatinine Ratio: 15 (ref 9–23)
BUN: 17 mg/dL (ref 6–24)
Bilirubin Total: 0.7 mg/dL (ref 0.0–1.2)
CO2: 25 mmol/L (ref 20–29)
Calcium: 10 mg/dL (ref 8.7–10.2)
Chloride: 102 mmol/L (ref 96–106)
Creatinine, Ser: 1.11 mg/dL — ABNORMAL HIGH (ref 0.57–1.00)
Globulin, Total: 3.3 g/dL (ref 1.5–4.5)
Glucose: 92 mg/dL (ref 70–99)
Potassium: 3.9 mmol/L (ref 3.5–5.2)
Sodium: 142 mmol/L (ref 134–144)
Total Protein: 8 g/dL (ref 6.0–8.5)
eGFR: 58 mL/min/{1.73_m2} — ABNORMAL LOW (ref >59)

## 2023-06-16 ENCOUNTER — Other Ambulatory Visit: Payer: Self-pay | Admitting: Internal Medicine

## 2023-06-16 MED ORDER — TRIAMCINOLONE ACETONIDE 0.5 % EX CREA: TOPICAL_CREAM | Freq: Two times a day (BID) | CUTANEOUS | 0 refills | Status: DC

## 2023-06-16 NOTE — Telephone Encounter (Signed)
 Copied from CRM [id number]. Topic: Clinical - Medication Refill >> Jun 16, 2023  8:42 AM Everette C wrote: Most Recent Primary Care Visit:  Provider: [name]  Department: [facility]  Visit Type: OFFICE VISIT  Date: 05/29/2023  Medication: triamcinolone cream (KENALOG) 0.5 % [[id number]]  Has the patient contacted their pharmacy? Yes (Agent: If no, request that the patient contact the pharmacy for the refill. If patient does not wish to contact the pharmacy document the reason why and proceed with request.) (Agent: If yes, when and what did the pharmacy advise?)  Is this the correct pharmacy for this prescription? Yes If no, delete pharmacy and type the correct one.  This is the patient's preferred pharmacy:  [id number] - [place], [place] - [address] AT [facility] OF [address] & [address]  [place] [address] Phone: [phone] Fax: [phone]   Has the prescription been filled recently? No  Is the patient out of the medication? Yes  Has the patient been seen for an appointment in the last year OR does the patient have an upcoming appointment? Yes  Can we respond through MyChart? No  Agent: Please be advised that Rx refills may take up to 3 business days. We ask that you follow-up with your pharmacy.

## 2023-06-17 ENCOUNTER — Other Ambulatory Visit: Payer: Self-pay | Admitting: Internal Medicine

## 2023-06-17 DIAGNOSIS — Z1231 Encounter for screening mammogram for malignant neoplasm of breast: Secondary | ICD-10-CM

## 2023-06-27 ENCOUNTER — Ambulatory Visit: Payer: Self-pay | Admitting: Pharmacist

## 2023-07-09 ENCOUNTER — Other Ambulatory Visit: Payer: Self-pay | Admitting: Internal Medicine

## 2023-07-09 NOTE — Telephone Encounter (Signed)
 Copied from CRM [id number]. Topic: Clinical - Medication Refill >> Jul 09, 2023  3:21 PM Priscille Loveless wrote: Most Recent Primary Care Visit:  Provider: [name]  Department: [facility]  Visit Type: OFFICE VISIT  Date: 05/29/2023  Medication: triamcinolone cream (KENALOG) 0.5 %  Has the patient contacted their pharmacy? Yes   Is this the correct pharmacy for this prescription? Yes  This is the patient's preferred pharmacy:   [id number] - [place], [place] - [address] AT [facility] OF [address] [place] [address] Phone: [phone] Fax: [phone]   Has the prescription been filled recently? Yes  Is the patient out of the medication? Yes  Has the patient been seen for an appointment in the last year OR does the patient have an upcoming appointment? Yes  Can we respond through MyChart? No  Agent: Please be advised that Rx refills may take up to 3 business days. We ask that you follow-up with your pharmacy.

## 2023-07-10 MED ORDER — TRIAMCINOLONE ACETONIDE 0.5 % EX CREA: TOPICAL_CREAM | Freq: Two times a day (BID) | CUTANEOUS | 0 refills | Status: DC

## 2023-07-10 NOTE — Telephone Encounter (Signed)
 Requested medication (s) are due for refill today - unsure  Requested medication (s) are on the active medication list -yes  Future visit scheduled -yes  Last refill: 06/16/23 30g  Notes to clinic: non delegated Rx  Requested Prescriptions  Pending Prescriptions Disp Refills   triamcinolone cream (KENALOG) 0.5 % 30 g 0    Sig: Apply topically 2 (two) times daily.     Not Delegated - Dermatology:  Corticosteroids Failed - 07/10/2023  8:56 AM      Failed - This refill cannot be delegated      Passed - Valid encounter within last 12 months    Recent Outpatient Visits           1 month ago Primary hypertension   [facility] [facility] [place]. [facility] [name], RPH-CPP   2 months ago Essential hypertension   [name] Of [place]. [facility] Marcine Matar, MD   4 months ago Essential hypertension   [place] [facility] Of [place]. [facility] [name], [name], RPH-CPP   5 months ago Essential hypertension   [place] [facility] [place] [facility] Dept Of [place]. [facility] Marcine Matar, MD   6 months ago Chronic toe pain, bilateral   [place] [facility] Dept Of [place]. [facility] Marcine Matar, MD       Future Appointments             In 2 weeks [name], [name], RPH-CPP [place] [facility] [place] [facility] Dept Of [place] Bridegroom. [facility]   In 1 month [name], [name], MD [facility] [place] - A Dept Of [place] Bridegroom. [facility]               Requested Prescriptions  Pending Prescriptions Disp Refills   triamcinolone cream (KENALOG) 0.5 % 30 g 0    Sig: Apply topically 2 (two) times daily.     Not Delegated - Dermatology:  Corticosteroids Failed - 07/10/2023  8:56 AM      Failed - This refill cannot be delegated      Passed - Valid encounter within last  12 months    Recent Outpatient Visits           1 month ago Primary hypertension   [place] [facility] Of [place]. [facility] [name], RPH-CPP   2 months ago Essential hypertension   [place] [facility] Dept Of [place]. [facility] Marcine Matar, MD   4 months ago Essential hypertension    [facility] [place] - A Dept Of [place]. [facility] [name], [name], RPH-CPP   5 months ago Essential hypertension    [facility] [place] - A Dept Of [place]. [facility] Marcine Matar, MD   6 months ago Chronic toe pain, bilateral    [facility] [place] - A Dept Of [place]. [facility] Marcine Matar, MD       Future Appointments             In 2 weeks [name], [name], RPH-CPP  [facility] [place] [facility] Dept Of [place] Bridegroom.  [facility]   In 1 month [name], [name], MD [facility] [place] - A Dept Of [place]igha Bridegroom. [facility]

## 2023-07-24 ENCOUNTER — Ambulatory Visit
Admission: RE | Admit: 2023-07-24 | Discharge: 2023-07-24 | Disposition: A | Discharge status: Home / Self Care | Payer: Medicaid Other | Source: Ambulatory Visit | Attending: Internal Medicine | Admitting: Internal Medicine

## 2023-07-24 DIAGNOSIS — Z1231 Encounter for screening mammogram for malignant neoplasm of breast: Secondary | ICD-10-CM

## 2023-07-28 ENCOUNTER — Other Ambulatory Visit: Payer: Self-pay | Admitting: Internal Medicine

## 2023-07-28 DIAGNOSIS — R928 Other abnormal and inconclusive findings on diagnostic imaging of breast: Secondary | ICD-10-CM

## 2023-07-29 ENCOUNTER — Ambulatory Visit: Admitting: Pharmacist

## 2023-07-30 ENCOUNTER — Encounter: Payer: Self-pay | Admitting: Podiatry

## 2023-07-30 ENCOUNTER — Ambulatory Visit (INDEPENDENT_AMBULATORY_CARE_PROVIDER_SITE_OTHER): Payer: Medicaid Other | Admitting: Podiatry

## 2023-07-30 DIAGNOSIS — N1831 Chronic kidney disease, stage 3a: Secondary | ICD-10-CM

## 2023-07-30 DIAGNOSIS — B351 Tinea unguium: Secondary | ICD-10-CM | POA: Diagnosis not present

## 2023-07-30 DIAGNOSIS — M79675 Pain in left toe(s): Secondary | ICD-10-CM

## 2023-07-30 DIAGNOSIS — M79674 Pain in right toe(s): Secondary | ICD-10-CM | POA: Diagnosis not present

## 2023-07-30 DIAGNOSIS — M792 Neuralgia and neuritis, unspecified: Secondary | ICD-10-CM

## 2023-07-30 NOTE — Progress Notes (Signed)
 This patient returns to my office for at risk foot care.  This patient requires this care by a professional since this patient will be at risk due to having CKD.   This patient is unable to cut nails herself since the patient cannot reach hernails.These nails are painful walking and wearing shoes.  This patient presents for at risk foot care today.  General Appearance  Alert, conversant and in no acute stress.  Vascular  Dorsalis pedis and posterior tibial  pulses are palpable  bilaterally.  Capillary return is within normal limits  bilaterally. Temperature is within normal limits  bilaterally.  Neurologic  Senn-Weinstein monofilament wire test within normal limits  bilaterally. Muscle power within normal limits bilaterally.  Nails Thick disfigured discolored nails with subungual debris  from hallux to fifth toes bilaterally. No evidence of bacterial infection or drainage bilaterally.  Orthopedic  No limitations of motion  feet .  No crepitus or effusions noted.  No bony pathology or digital deformities noted. Palpable pain 3rd interspace left foot.  Skin  normotropic skin with no porokeratosis noted bilaterally.  No signs of infections or ulcers noted.     Onychomycosis  Pain in right toes  Pain in left toes  Consent was obtained for treatment procedures.   Mechanical debridement of nails 1-5  bilaterally performed with a nail nipper.  Filed with dremel without incident.    Return office visit     3 months                 Told patient to return for periodic foot care and evaluation due to potential at risk complications.   Helane Gunther DPM

## 2023-08-12 ENCOUNTER — Other Ambulatory Visit

## 2023-08-12 ENCOUNTER — Ambulatory Visit
Admission: RE | Admit: 2023-08-12 | Discharge: 2023-08-12 | Disposition: A | Discharge status: Home / Self Care | Source: Ambulatory Visit | Attending: Internal Medicine | Admitting: Internal Medicine

## 2023-08-12 DIAGNOSIS — R590 Localized enlarged lymph nodes: Secondary | ICD-10-CM | POA: Diagnosis not present

## 2023-08-12 DIAGNOSIS — R928 Other abnormal and inconclusive findings on diagnostic imaging of breast: Secondary | ICD-10-CM

## 2023-08-13 ENCOUNTER — Encounter: Payer: Self-pay | Admitting: Internal Medicine

## 2023-08-13 ENCOUNTER — Other Ambulatory Visit: Payer: Self-pay | Admitting: Internal Medicine

## 2023-08-13 DIAGNOSIS — R590 Localized enlarged lymph nodes: Secondary | ICD-10-CM

## 2023-08-13 NOTE — Progress Notes (Signed)
 Patient has bilateral axillary lymph nodes that appear benign on ultrasound per radiology.  Recommend repeat axillary ultrasound in 3 months.

## 2023-08-26 ENCOUNTER — Ambulatory Visit: Admitting: Pharmacist

## 2023-08-26 ENCOUNTER — Ambulatory Visit: Payer: Self-pay

## 2023-08-26 NOTE — Telephone Encounter (Signed)
 Patient's DPR, Mr. Lincoln Renshaw, called stating patient is still in pain in her left pinky toe. Patient saw Podiatry and podiatry stated they did all they could for her. Patient has been taking pain medication Pregabalin  and it is not helping. Patient is looking for further advice on how to proceed after dead end with podiatry. Please advise - looking for pain control and next plan of care. Mr. Lincoln Renshaw also puts cream, Aspercreme on the toe as well. Mr. Lincoln Renshaw states he also has Voltaren . None of the above work. Mr. Lincoln Renshaw states patient needs another type of pain medication. Patient unable to be triaged due to not being with DPR. Patient does have upcoming appt for May 9.  Mr. Lincoln Renshaw would like to be called back on [phone].   Copied from CRM [id number]. Topic: Clinical - Red Word Triage >> Aug 26, 2023 10:40 AM Stanly Early wrote: Red Word that prompted transfer to Nurse Triage: nerve pain in her left toe, patient is currently taking pain med but its not working. Reason for Disposition  [1] Caller requesting NON-URGENT health information AND [2] PCP's office is the best resource  Answer Assessment - Initial Assessment Questions 1. REASON FOR CALL or QUESTION: "What is your reason for calling today?" or "How can I best help you?" or "What question do you have that I can help answer?"     Please see notes.  Protocols used: Information Only Call - No Triage-A-AH

## 2023-08-26 NOTE — Telephone Encounter (Signed)
 Spoke with Mr. Lincoln Renshaw. Advised the he keep patient's appointment on Friday so the patient concerns.  Mr. Lincoln Renshaw voices that he was not happy with the podiatrist care. Mr. Lincoln Renshaw said that he would keep her appointment.

## 2023-08-29 ENCOUNTER — Ambulatory Visit: Payer: Self-pay | Attending: Internal Medicine | Admitting: Internal Medicine

## 2023-08-29 ENCOUNTER — Encounter: Payer: Self-pay | Admitting: Internal Medicine

## 2023-08-29 VITALS — BP 168/72 | HR 70 | Ht 68.0 in | Wt 179.6 lb

## 2023-08-29 DIAGNOSIS — Z6827 Body mass index (BMI) 27.0-27.9, adult: Secondary | ICD-10-CM | POA: Diagnosis not present

## 2023-08-29 DIAGNOSIS — R591 Generalized enlarged lymph nodes: Secondary | ICD-10-CM

## 2023-08-29 DIAGNOSIS — I10 Essential (primary) hypertension: Secondary | ICD-10-CM | POA: Diagnosis not present

## 2023-08-29 DIAGNOSIS — M79673 Pain in unspecified foot: Secondary | ICD-10-CM | POA: Diagnosis not present

## 2023-08-29 DIAGNOSIS — G8929 Other chronic pain: Secondary | ICD-10-CM | POA: Diagnosis not present

## 2023-08-29 DIAGNOSIS — E663 Overweight: Secondary | ICD-10-CM

## 2023-08-29 DIAGNOSIS — Z23 Encounter for immunization: Secondary | ICD-10-CM | POA: Diagnosis not present

## 2023-08-29 DIAGNOSIS — R590 Localized enlarged lymph nodes: Secondary | ICD-10-CM

## 2023-08-29 DIAGNOSIS — Z1211 Encounter for screening for malignant neoplasm of colon: Secondary | ICD-10-CM

## 2023-08-29 DIAGNOSIS — R7303 Prediabetes: Secondary | ICD-10-CM | POA: Diagnosis not present

## 2023-08-29 MED ORDER — AMLODIPINE BESYLATE 10 MG PO TABS: 10.0000 mg | ORAL_TABLET | Freq: Every day | ORAL | 1 refills | Status: DC

## 2023-08-29 MED ORDER — HYDROCHLOROTHIAZIDE 25 MG PO TABS: 25.0000 mg | ORAL_TABLET | Freq: Every morning | ORAL | 1 refills | Status: DC

## 2023-08-29 NOTE — Patient Instructions (Signed)

## 2023-08-29 NOTE — Progress Notes (Signed)
 Patient ID: Brandy Galloway, female    DOB: 12/20/1965  MRN: [id number]  CC: Medical Management of Chronic Issues (Left toe pain)   Subjective: Brandy Galloway is a 58 y.o. female who presents for chronic ds management.  Her caregiver and guardian, Pascual Bonds, is with her. Her concerns today include:  Patient with history of HTN/CKD, obesity/prediabetes, abnormal Pap smear with history of LGSIL, developmental delay since birth with microcephaly , seizure (only one time many yrs ago; not on med), anxiety, lumbar stenosis s/p laminectomy (Dr. Benedetta Bradley), chronic left-sided facial pain   Uncle has meds with him today  HTN/CKD: Patient should be on amlodipine  10 mg, HCTZ 25 mg and valsartan  80 mg daily and hydralazine  100 mg 3 times a day. Uncle reports she only got a.m dose of Hydralazine  before coming to appt today.  Did not get the rest of her meds as yet today but reports he usually gives her all of her BP meds in the mornings Most recent GFR was 58 with creatinine of 1.1 in February of this year. HL: She should be on atorvastatin  10 mg daily.  Obesity/prediabetes: Last A1c done 4 months ago was 5.9.  Uncle reports she has good appetite; loves sugar free cookies; likes sugar free apple juice. .   Still complains of pain in the left 5th toe. Saw podiatry and had it filed down. It was filed down recently. Aspercreme helps. Uncle also gives her Tylenol  500mg  Rapid Relief twice a day and Lyrica  50 mg BID  HM: Had MMG that showed axillary LN; f/u US  revealed likely benign LN; repet in 3 mths  Patient Active Problem List   Diagnosis Date Noted   Stage 3a chronic kidney disease (HCC) 04/29/2023   Jaw pain 12/29/2021   AKI (acute kidney injury) (HCC) 12/29/2021   HTN (hypertension) 12/29/2021   Spondylolisthesis of lumbar region 11/22/2020   Abnormal EKG 01/11/2020   Lumbar stenosis with neurogenic claudication 03/31/2019   Degenerative lumbar spinal stenosis 12/09/2018   Lumbar spondylosis  12/09/2018   Lumbar radiculopathy 12/09/2018   Chronic low back pain 10/07/2018   LGSIL on Pap smear of cervix    Postmenopausal vaginal bleeding    Low grade squamous intraepith lesion on cytologic smear cervix (lgsil) 07/12/2011     Current Outpatient Medications on File Prior to Visit  Medication Sig Dispense Refill   atorvastatin  (LIPITOR) 10 MG tablet Take 1 tablet (10 mg total) by mouth daily. 90 tablet 1   hydrALAZINE  (APRESOLINE ) 100 MG tablet Take 1 tablet (100 mg total) by mouth 3 (three) times daily. 270 tablet 1   pregabalin  (LYRICA ) 50 MG capsule Take 1 capsule (50 mg total) by mouth 2 (two) times daily. 60 capsule 3   acetaminophen  (TYLENOL ) 500 MG tablet Take 1 tablet (500 mg total) by mouth every 6 (six) hours as needed. (Patient not taking: Reported on 08/29/2023) 60 tablet 1   Misc. Devices MISC Blood Pressure machine (Patient not taking: Reported on 08/29/2023) 1 each 0   triamcinolone  cream (KENALOG ) 0.5 % Apply topically 2 (two) times daily. (Patient not taking: Reported on 08/29/2023) 30 g 0   valsartan  (DIOVAN ) 80 MG tablet Take 1 tablet (80 mg total) by mouth daily. (Patient not taking: Reported on 08/29/2023) 90 tablet 3   Vitamin D , Ergocalciferol , (DRISDOL) 1.25 MG (50000 UNIT) CAPS capsule Take 50,000 Units by mouth once a week. (Patient not taking: Reported on 08/29/2023)     No current facility-administered medications on file prior  to visit.    No Known Allergies  Social History   Socioeconomic History   Marital status: Single    Spouse name: Not on file   Number of children: 0   Years of education: Not on file   Highest education level: Not on file  Occupational History   Not on file  Tobacco Use   Smoking status: Never   Smokeless tobacco: Never  Vaping Use   Vaping status: Never Used  Substance and Sexual Activity   Alcohol use: Never   Drug use: Never   Sexual activity: Never    Birth control/protection: None  Other Topics Concern   Not on file   Social History Narrative   08/01/21 Lives with her uncle who is her legal guardian, Louann Rous.         Left Handed       Social Drivers of Health   Financial Resource Strain: Low Risk  (06/28/2022)   Overall Financial Resource Strain (CARDIA)    Difficulty of Paying Living Expenses: Not hard at all  Food Insecurity: No Food Insecurity (06/28/2022)   Hunger Vital Sign    Worried About Running Out of Food in the Last Year: Never true    Ran Out of Food in the Last Year: Never true  Transportation Needs: No Transportation Needs (06/28/2022)   PRAPARE - Administrator, Civil Service (Medical): No    Lack of Transportation (Non-Medical): No  Physical Activity: Inactive (06/28/2022)   Exercise Vital Sign    Days of Exercise per Week: 0 days    Minutes of Exercise per Session: 0 min  Stress: No Stress Concern Present (06/28/2022)   [employer] of Occupational Health - Occupational Stress Questionnaire    Feeling of Stress : Not at all  Social Connections: Socially Isolated (06/28/2022)   Social Connection and Isolation Panel [NHANES]    Frequency of Communication with Friends and Family: Never    Frequency of Social Gatherings with Friends and Family: More than three times a week    Attends Religious Services: Never    Database administrator or Organizations: Not on file    Attends Banker Meetings: Never    Marital Status: Never married  Intimate Partner Violence: Not At Risk (06/28/2022)   Humiliation, Afraid, Rape, and Kick questionnaire    Fear of Current or Ex-Partner: No    Emotionally Abused: No    Physically Abused: No    Sexually Abused: No    Family History  Problem Relation Age of Onset   Diabetes Mother    Diabetes Father     Past Surgical History:  Procedure Laterality Date   CERVICAL CONE BIOPSY  04/23/2007   CERVICAL CONIZATION W/BX  08/20/2011   Procedure: CONIZATION CERVIX WITH BIOPSY;  Surgeon: Ana Balling, MD;  Location: [facility];   Service: Gynecology;  Laterality: N/A;  With Endocervical Currettage   COLPOSCOPY  06/21/2011   COLPOSCOPY N/A 05/28/2016   Procedure: COLPOSCOPY;  Surgeon: Verlyn Goad, MD;  Location: [facility];  Service: Gynecology;  Laterality: N/A;   DILATION AND CURETTAGE OF UTERUS N/A 05/28/2016   Procedure: DILATATION AND CURETTAGE;  Surgeon: Verlyn Goad, MD;  Location: [facility];  Service: Gynecology;  Laterality: N/A;   LEEP  04/23/2007   LUMBAR LAMINECTOMY/DECOMPRESSION MICRODISCECTOMY N/A 03/31/2019   Procedure: LAMINECTOMY LUMBAR THREE- LUMBAR FOUR, LUMBAR FOUR- LUMBAR FIVE;  Surgeon: Audie Bleacher, MD;  Location: [facility];  Service: Neurosurgery;  Laterality: N/A;  LAMINECTOMY LUMBAR THREE- LUMBAR FOUR, LUMBAR FOUR- LUMBAR FIVE   MOUTH SURGERY Left 06/2021   Jaw surgery   RADIOLOGY WITH ANESTHESIA N/A 10/17/2020   Procedure: MRI WITH ANESTHESIA LUMBAR SPINE WITH AND WITHOUT CONTRAST;  Surgeon: Radiologist, Medication, MD;  Location: [facility];  Service: Radiology;  Laterality: N/A;   RADIOLOGY WITH ANESTHESIA N/A 11/06/2021   Procedure: MRI WITH ANESTHESIA OF BRAIN WITH AND WITHOUT CONTRAST, MR ANGIOGRAM OF HEAD WITHOUT CONTRAST;  Surgeon: Radiologist, Medication, MD;  Location: [facility];  Service: Radiology;  Laterality: N/A;   TOOTH EXTRACTION N/A 06/29/2021   Procedure: REMOVAL BILATERAL MANDIBULAR LINGUAL TORI;  Surgeon: Ascencion Lava, DMD;  Location: [facility];  Service: Oral Surgery;  Laterality: N/A;    ROS: Review of Systems Negative except as stated above  PHYSICAL EXAM: BP (!) 168/72   Pulse 70   Ht 5\' 8"  (1.727 m)   Wt 179 lb 9.6 oz (81.5 kg)   LMP  (LMP Unknown)   SpO2 98%   BMI 27.31 kg/m   Wt Readings from Last 3 Encounters:  08/29/23 179 lb 9.6 oz (81.5 kg)  04/29/23 168 lb (76.2 kg)  01/20/23 170 lb (77.1 kg)    Physical Exam  General appearance - alert, well appearing, older AAF and in no distress Mental status -patient unable to provide much history.  History is given by her  uncle. Neck - supple, no significant adenopathy Chest - clear to auscultation, no wheezes, rales or rhonchi, symmetric air entry Heart - normal rate, regular rhythm, normal S1, S2, no murmurs, rubs, clicks or gallops Extremities - no LE edema No Axillary LN appreciated      Latest Ref Rng & Units 05/29/2023    2:40 PM 04/29/2023   11:57 AM 07/18/2022   12:06 PM  CMP  Glucose 70 - 99 mg/dL 92  98    BUN 6 - 24 mg/dL 17  16    Creatinine 1.61 - 1.00 mg/dL 0.96  0.45    Sodium 409 - 144 mmol/L 142  142    Potassium 3.5 - 5.2 mmol/L 3.9  4.2    Chloride 96 - 106 mmol/L 102  100    CO2 20 - 29 mmol/L 25  25    Calcium  8.7 - 10.2 mg/dL 81.1  91.4  9.9   Total Protein 6.0 - 8.5 g/dL 8.0  7.8    Total Bilirubin 0.0 - 1.2 mg/dL 0.7  0.6    Alkaline Phos 44 - 121 IU/L 129  129    AST 0 - 40 IU/L 20  18    ALT 0 - 32 IU/L 27  16     Lipid Panel     Component Value Date/Time   CHOL 242 (H) 04/29/2023 1157   TRIG 96 04/29/2023 1157   HDL 69 04/29/2023 1157   CHOLHDL 3.5 04/29/2023 1157   LDLCALC 156 (H) 04/29/2023 1157    CBC    Component Value Date/Time   WBC 5.0 04/29/2023 1157   WBC 6.7 07/03/2022 1542   RBC 4.37 04/29/2023 1157   RBC 4.74 07/03/2022 1542   HGB 13.0 04/29/2023 1157   HCT 39.5 04/29/2023 1157   PLT 283 04/29/2023 1157   MCV 90 04/29/2023 1157   MCH 29.7 04/29/2023 1157   MCH 29.3 07/03/2022 1542   MCHC 32.9 04/29/2023 1157   MCHC 33.7 07/03/2022 1542   RDW 12.9 04/29/2023 1157   LYMPHSABS 2.2 07/03/2022 1542   MONOABS 0.5 07/03/2022 1542  EOSABS 0.2 07/03/2022 1542   BASOSABS 0.1 07/03/2022 1542    ASSESSMENT AND PLAN: 1. Essential hypertension (Primary) Not at goal.  She has only had her morning dose of hydralazine  and has not had her other blood pressure medications that include amlodipine , hydrochlorothiazide  and valsartan  as yet for today.  She will get them when she returns home.  Encouraged the uncle to make sure that she gets her medications in  every morning.  Will have him follow-up with the clinical pharmacist in 1 month for recheck.  Advised to make sure that she takes her blood pressure medications before coming to that visit. - hydrochlorothiazide  (HYDRODIURIL ) 25 MG tablet; Take 1 tablet (25 mg total) by mouth every morning.  Dispense: 90 tablet; Refill: 1 - amLODipine  (NORVASC ) 10 MG tablet; Take 1 tablet (10 mg total) by mouth daily.  Dispense: 90 tablet; Refill: 1  2. Overweight (BMI 25.0-29.9) Discussed some healthy eating tips.  Recommend that her caregiver purchased healthy snacks like fruits or nuts instead of cookies  3. Axillary lymphadenopathy Not appreciated on exam but seen on mammogram and ultrasound.  Plan is for repeat ultrasound in 3 months per radiology  4. Need for vaccination against Streptococcus pneumoniae - Pneumococcal conjugate vaccine 20-valent (Prevnar 20)  5. Screening for colon cancer We have not been successful with the Cologuard kit.  I will have my CMA give her a fit test to be tried instead. - Fecal occult blood, imunochemical([facility]/Sunquest)  6. Prediabetes See #2 above. 7.  Chronic left toe pain Okay to continue the Lyrica  and Tylenol .  Patient was given the opportunity to ask questions.  Patient verbalized understanding of the plan and was able to repeat key elements of the plan.   This documentation was completed using Paediatric nurse.  Any transcriptional errors are unintentional.  Orders Placed This Encounter  Procedures   Fecal occult blood, imunochemical([facility]/Sunquest)   Pneumococcal conjugate vaccine 20-valent (Prevnar 20)     Requested Prescriptions   Signed Prescriptions Disp Refills   hydrochlorothiazide  (HYDRODIURIL ) 25 MG tablet 90 tablet 1    Sig: Take 1 tablet (25 mg total) by mouth every morning.   amLODipine  (NORVASC ) 10 MG tablet 90 tablet 1    Sig: Take 1 tablet (10 mg total) by mouth daily.    Return in about 4 months (around  12/30/2023) for BP check Luke in 4 wks.  Concetta Dee, MD, FACP

## 2023-09-02 DIAGNOSIS — Z1211 Encounter for screening for malignant neoplasm of colon: Secondary | ICD-10-CM | POA: Diagnosis not present

## 2023-09-03 LAB — FECAL OCCULT BLOOD, IMMUNOCHEMICAL: Fecal Occult Bld: NEGATIVE

## 2023-09-04 ENCOUNTER — Ambulatory Visit: Payer: Self-pay | Admitting: Internal Medicine

## 2023-09-29 NOTE — Progress Notes (Signed)
 S:     PCP: Dr. Lincoln Renshaw  58 y.o. female who presents for hypertension evaluation, education, and management.   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   Patient was referred and last seen by Primary Care Provider, Dr. Lincoln Renshaw, on 04/29/2023. BP was 151/70 at that visit. Valsartan  40 mg daily was added. Her guardian reported adherence for all her current BP medication.  We saw her on 02/18/23. BP that visit was 175/83. At that visit, her guardian reported that he only has her taking hydralazine  100 mg TID for BP. She was not taking hydrochlorothiazide  or amlodipine . She returned for follow-up on 05/29/2023 and BP at that visit was 152/72. Valsartan  was increased to 80 mg daily. She last saw PCP on 08/29/2023, BP was 168/72 but patient had only received AM dose of hydralazine  before that appointment. No medication change were made at that visit.   Patient reports hypertension is longstanding.   Today, patient arrives in spirits and presents with the assistance of her uncle/guardian [name]. Denies dizziness, headache, blurred vision, swelling. Guardian reports valsartan , amlodipine , hydralazine  and HCTZ taken this morning. Inquired about medication refills since fill history doesn't support compliance, patient's guardian only expresses needing refills of atorvastatin .   Family/Social history:  -Fhx: DM -Tobacco: denies -Alcohol: denies  Current antihypertensives: amlodipine  10mg  once daily (last filled 05/26/2023 for 90 ds), hydralazine  100 mg TID (last filled 02/01/2023 for 90 ds), HCTZ 25 mg daily (last filled 05/26/2023 for 90 ds), valsartan  80 mg daily (last filled 05/29/2023 for 90 ds)  Current hyperlipidemia medications: atorvastatin  10 mg  Reported home BP readings: 160/50 (3 days ago, after AM  medications)  Patient reported dietary habits: 2 meals/days Breakfast: whole wheat bread, eggs, jam Lunch: salmon, green peas, rice, string beans Dinner: chicken (baked), macaroni cheese, strong cheese, cornbreads, mashed potatoes Snacks: zero sugar cookies Drinks: apple juice (2 glasses, zero sugar), water  Patient-reported exercise habits:  -neighborhood/backyard walks  O:   Vitals:   09/30/23 1357  BP: 123/67  Pulse: 77    Last 3 Office BP readings: BP Readings from Last 3 Encounters:  09/30/23 123/67  08/29/23 (!) 168/72  05/29/23 (!) 152/72   BMET    Component Value Date/Time   NA 142 05/29/2023 1440   K 3.9 05/29/2023 1440   CL 102 05/29/2023 1440   CO2 25 05/29/2023 1440   GLUCOSE 92 05/29/2023 1440   GLUCOSE 121 (H) 07/03/2022 1542   BUN 17 05/29/2023 1440   CREATININE 1.11 (H) 05/29/2023 1440   CALCIUM  10.0 05/29/2023 1440   GFRNONAA 52 (L) 07/03/2022 1542   GFRAA >60 03/29/2019 1042   Renal function: CrCl cannot be calculated (Patient's most recent lab result is older than the maximum 21 days allowed.).  The 10-year ASCVD risk score (Arnett DK, et al., 2019) is: 10%   Values used to calculate the score:     Age: 55 years     Sex: Female     Is Non-Hispanic African American: Yes     Diabetic: No     Tobacco smoker: No     Systolic Blood Pressure: 151 mmHg     Is BP treated: Yes     HDL Cholesterol: 69 mg/dL     Total Cholesterol: 242 mg/dL  Patient is currently participating in a managed medicaid plan: yes  A/P: Hypertension diagnosed currently at goal on current medications. BP goal < 130/80 mmHg. Medication adherence is optimal, although refill history does not corroborate this.  -Continue amlodipine  10 mg once daily. -Continue HCTZ 25 mg once daily -Continue hydralazine  100mg  TID  -Continue valsartan  80 mg once daily  -Obtain BMP to ensure Scr has normalized from Feb 2025 -Counseled on lifestyle modifications for blood pressure control  including reduced dietary sodium, increased exercise, adequate sleep. -Encouraged patient to check BP at home and bring log of readings to next visit. Counseled on proper use of home BP cuff.   Results reviewed and written information provided.    Written patient instructions provided. Patient verbalized understanding of treatment plan.  Total time in face to face counseling 30 minutes.    Follow-up:  PCP: 12/30/2023 Pharmacy: 4-6 weeks for repeat BP check  Juleen Oakland, PharmD PGY1 Pharmacy Resident

## 2023-09-30 ENCOUNTER — Encounter: Payer: Self-pay | Admitting: Pharmacist

## 2023-09-30 ENCOUNTER — Ambulatory Visit: Attending: Family Medicine | Admitting: Pharmacist

## 2023-09-30 VITALS — BP 123/67 | HR 77

## 2023-09-30 DIAGNOSIS — E785 Hyperlipidemia, unspecified: Secondary | ICD-10-CM | POA: Diagnosis not present

## 2023-09-30 DIAGNOSIS — I10 Essential (primary) hypertension: Secondary | ICD-10-CM | POA: Diagnosis not present

## 2023-09-30 MED ORDER — ATORVASTATIN CALCIUM 10 MG PO TABS: 10.0000 mg | ORAL_TABLET | Freq: Every day | ORAL | 1 refills | Status: DC

## 2023-10-01 ENCOUNTER — Ambulatory Visit: Payer: Self-pay | Admitting: Internal Medicine

## 2023-10-01 LAB — BMP8+EGFR
BUN/Creatinine Ratio: 18 (ref 9–23)
BUN: 18 mg/dL (ref 6–24)
CO2: 22 mmol/L (ref 20–29)
Calcium: 10 mg/dL (ref 8.7–10.2)
Chloride: 99 mmol/L (ref 96–106)
Creatinine, Ser: 0.98 mg/dL (ref 0.57–1.00)
Glucose: 91 mg/dL (ref 70–99)
Potassium: 3.7 mmol/L (ref 3.5–5.2)
Sodium: 141 mmol/L (ref 134–144)
eGFR: 67 mL/min/{1.73_m2} (ref >59)

## 2023-10-29 ENCOUNTER — Encounter: Payer: Self-pay | Admitting: Podiatry

## 2023-10-29 ENCOUNTER — Ambulatory Visit (INDEPENDENT_AMBULATORY_CARE_PROVIDER_SITE_OTHER): Admitting: Podiatry

## 2023-10-29 DIAGNOSIS — M79675 Pain in left toe(s): Secondary | ICD-10-CM | POA: Diagnosis not present

## 2023-10-29 DIAGNOSIS — M79674 Pain in right toe(s): Secondary | ICD-10-CM

## 2023-10-29 DIAGNOSIS — B351 Tinea unguium: Secondary | ICD-10-CM | POA: Diagnosis not present

## 2023-10-29 DIAGNOSIS — N1831 Chronic kidney disease, stage 3a: Secondary | ICD-10-CM

## 2023-10-29 DIAGNOSIS — L84 Corns and callosities: Secondary | ICD-10-CM

## 2023-10-29 NOTE — Progress Notes (Signed)
 This patient returns to my office for at risk foot care.  This patient requires this care by a professional since this patient will be at risk due to having CKD.   This patient is unable to cut nails herself since the patient cannot reach her nails.These nails are painful walking and wearing shoes.  This patient presents for at risk foot care today.  General Appearance  Alert, conversant and in no acute stress.  Vascular  Dorsalis pedis and posterior tibial  pulses are palpable  bilaterally.  Capillary return is within normal limits  bilaterally. Temperature is within normal limits  bilaterally.  Neurologic  Senn-Weinstein monofilament wire test within normal limits  bilaterally. Muscle power within normal limits bilaterally.  Nails Thick disfigured discolored nails with subungual debris  from hallux to fifth toes bilaterally. No evidence of bacterial infection or drainage bilaterally.  Orthopedic  No limitations of motion  feet .  No crepitus or effusions noted.  No bony pathology or digital deformities noted. Palpable pain 3rd interspace left foot.  Skin  normotropic skin with no porokeratosis noted bilaterally.  No signs of infections or ulcers noted.  Heel callus  B/L   Onychomycosis  Pain in right toes  Pain in left toes  Callus  B/L  Consent was obtained for treatment procedures.   Mechanical debridement of nails 1-5  bilaterally performed with a nail nipper.  Filed with dremel without incident. Debride heel callus and apply moisturizer.   Return office visit     3 months                 Told patient to return for periodic foot care and evaluation due to potential at risk complications.   Cordella Bold DPM

## 2023-11-05 ENCOUNTER — Ambulatory Visit: Admitting: Podiatry

## 2023-11-06 ENCOUNTER — Encounter: Payer: Self-pay | Admitting: Podiatry

## 2023-11-06 ENCOUNTER — Ambulatory Visit (INDEPENDENT_AMBULATORY_CARE_PROVIDER_SITE_OTHER): Admitting: Podiatry

## 2023-11-06 DIAGNOSIS — M79675 Pain in left toe(s): Secondary | ICD-10-CM

## 2023-11-06 DIAGNOSIS — L84 Corns and callosities: Secondary | ICD-10-CM

## 2023-11-06 DIAGNOSIS — N1831 Chronic kidney disease, stage 3a: Secondary | ICD-10-CM

## 2023-11-06 DIAGNOSIS — B351 Tinea unguium: Secondary | ICD-10-CM

## 2023-11-06 DIAGNOSIS — M79674 Pain in right toe(s): Secondary | ICD-10-CM

## 2023-11-06 NOTE — Progress Notes (Signed)
 Patient returned with her husband for continued treatment of fifth toenail right foot.  Patient has kidney disease. Heel callus persists both feet.  Callus heels  B/L  Thick nail fifth toe right foot  Debride callus on heel with dremel tool.  Debride fifth toenail right foot.   Cordella Bold DPM

## 2023-11-07 ENCOUNTER — Telehealth: Payer: Self-pay | Admitting: Internal Medicine

## 2023-11-07 NOTE — Telephone Encounter (Signed)
 Called patient to confirm appointment with Clinical Pharmacist on 11/11/2023 at 1:30 pm, but was unable to leave voicemail or reach patient.

## 2023-11-11 ENCOUNTER — Ambulatory Visit: Admitting: Pharmacist

## 2023-11-12 ENCOUNTER — Ambulatory Visit (INDEPENDENT_AMBULATORY_CARE_PROVIDER_SITE_OTHER): Admitting: Podiatry

## 2023-11-12 DIAGNOSIS — M79675 Pain in left toe(s): Secondary | ICD-10-CM

## 2023-11-12 DIAGNOSIS — M79674 Pain in right toe(s): Secondary | ICD-10-CM

## 2023-11-12 DIAGNOSIS — N1831 Chronic kidney disease, stage 3a: Secondary | ICD-10-CM

## 2023-11-12 DIAGNOSIS — B351 Tinea unguium: Secondary | ICD-10-CM

## 2023-11-12 DIAGNOSIS — L84 Corns and callosities: Secondary | ICD-10-CM

## 2023-11-12 NOTE — Progress Notes (Signed)
 Patient returned with her husband for continued treatment of fifth toenail right foot.  Patient has kidney disease. Heel callus persists both feet.  Callus heels  B/L  Thick nail fifth toe right foot  Debride callus on heel with dremel tool.  Debride fifth toenail right foot.   Cordella Bold DPM

## 2023-11-13 ENCOUNTER — Ambulatory Visit: Payer: Self-pay

## 2023-11-13 ENCOUNTER — Ambulatory Visit
Admission: RE | Admit: 2023-11-13 | Discharge: 2023-11-13 | Disposition: A | Discharge status: Home / Self Care | Source: Ambulatory Visit | Attending: Internal Medicine | Admitting: Internal Medicine

## 2023-11-13 ENCOUNTER — Ambulatory Visit: Payer: Self-pay | Admitting: Internal Medicine

## 2023-11-13 ENCOUNTER — Other Ambulatory Visit

## 2023-11-13 DIAGNOSIS — R936 Abnormal findings on diagnostic imaging of limbs: Secondary | ICD-10-CM | POA: Diagnosis not present

## 2023-11-13 DIAGNOSIS — R590 Localized enlarged lymph nodes: Secondary | ICD-10-CM

## 2023-11-13 NOTE — Telephone Encounter (Signed)
 FYI Only or Action Required?: FYI only for provider.  Patient was last seen in primary care on 08/29/2023 by Vicci Barnie NOVAK, MD.  Called Nurse Triage reporting Toe Pain.  Symptoms began ongoing.  Interventions attempted: OTC medications: Pregablin.  Symptoms are: unchanged.  Triage Disposition: See PCP Within 2 Weeks  Patient/caregiver understands and will follow disposition?: Yes     Copied from CRM #[id number]. Topic: Clinical - Red Word Triage >> Nov 13, 2023  4:11 PM Antwanette L wrote: Red Word that prompted transfer to Nurse Triage: Patient baby toe on the left foot is causing severe pain. The nerve in her toe is causing the pain. Patient is currently taking pregabalin  (LYRICA ) 50 MG capsule     Reason for Disposition  Toe pain is a chronic symptom (recurrent or ongoing AND present > 4 weeks)  Answer Assessment - Initial Assessment Questions 1. ONSET: When did the pain start?      Over a year  2. LOCATION: Where is the pain located?   (e.g., around nail, entire toe, at foot joint)      5th toe on the left foot 3. PAIN: How bad is the pain?    (Scale 1-10; or mild, moderate, severe)     Moderate 4. APPEARANCE: What does the toe look like? (e.g., redness, swelling, bruising, pallor)     Slight swelling noted since having ingrown toenail removed today  5. CAUSE: What do you think is causing the toe pain?     History of a nerve problem  6. OTHER SYMPTOMS: Do you have any other symptoms? (e.g., leg pain, rash, fever, numbness)     No  Protocols used: Toe Pain-A-AH

## 2023-11-18 ENCOUNTER — Ambulatory Visit: Admitting: Nurse Practitioner

## 2023-11-27 ENCOUNTER — Telehealth: Payer: Self-pay | Admitting: Internal Medicine

## 2023-11-27 NOTE — Telephone Encounter (Signed)
 Copied from CRM #[id number]. Topic: General - Other >> Nov 26, 2023  1:59 PM Pinkey ORN wrote: Reason for CRM: Requesting A Call Back >> Nov 26, 2023  2:00 PM Pinkey ORN wrote: Brandy Galloway [id number] is requesting a call back from the nurse.

## 2023-11-28 NOTE — Telephone Encounter (Signed)
 Called & spoke to [name], (authorized to receive information per DPR on file). He stated that the patient is still having on-going toe pain. He states that the Pregablin has helped ease the patient's nerve pain. He will continue to give the patient her medications as precribed along with the Pregablin to continue to help with the pain. No further questions / concerns. Patient confirmed upcoming appointment and will call back and schedule a sooner appointment if needed.

## 2023-12-25 ENCOUNTER — Ambulatory Visit (INDEPENDENT_AMBULATORY_CARE_PROVIDER_SITE_OTHER): Admitting: Podiatry

## 2023-12-25 ENCOUNTER — Encounter: Payer: Self-pay | Admitting: Podiatry

## 2023-12-25 DIAGNOSIS — B351 Tinea unguium: Secondary | ICD-10-CM | POA: Diagnosis not present

## 2023-12-25 DIAGNOSIS — N1831 Chronic kidney disease, stage 3a: Secondary | ICD-10-CM

## 2023-12-25 DIAGNOSIS — M79675 Pain in left toe(s): Secondary | ICD-10-CM | POA: Diagnosis not present

## 2023-12-25 DIAGNOSIS — M79674 Pain in right toe(s): Secondary | ICD-10-CM

## 2023-12-25 NOTE — Progress Notes (Signed)
 This patient presents to the office with chief complaint to her fifth toenail right foot.  She has returned saying the pain is present in the toe fifth right foot.  Her caregiver says she had similar problem fifth toe left foot and surgery was performed and she has improved.  The caregiver says there is continued nerve pain fifth toe right foot.  He believes it is caused by the nail fifth toe right foot.  General Appearance  Alert, conversant and in no acute stress.  Vascular  Dorsalis pedis and posterior tibial  pulses are palpable  bilaterally.  Capillary return is within normal limits  bilaterally. Temperature is within normal limits  bilaterally.  Neurologic  Senn-Weinstein monofilament wire test within normal limits  bilaterally. Muscle power within normal limits bilaterally.  Nails Thick disfigured discolored nails with subungual debris  from hallux to fifth toes bilaterally. No evidence of bacterial infection or drainage bilaterally.  Orthopedic  No limitations of motion  feet .  No crepitus or effusions noted.  No bony pathology or digital deformities noted.  Skin  normotropic skin with no porokeratosis noted bilaterally.  No signs of infections or ulcers noted.     Onychomycosis.  ROV.  Discussed condition with patient.  Told her she had no redness or swelling fifth toenail right foot.  Therefore I recommend she see Dr.  Tobie for evaluation and possible permanent removal nail plate right foot.  Cordella Bold DPM

## 2023-12-29 ENCOUNTER — Telehealth: Payer: Self-pay | Admitting: Internal Medicine

## 2023-12-29 NOTE — Telephone Encounter (Signed)
 Confirmed appt for 9/9

## 2023-12-30 ENCOUNTER — Encounter: Payer: Self-pay | Admitting: Internal Medicine

## 2023-12-30 ENCOUNTER — Ambulatory Visit: Attending: Internal Medicine | Admitting: Internal Medicine

## 2023-12-30 VITALS — BP 156/83 | HR 97 | Ht 68.0 in | Wt 164.0 lb

## 2023-12-30 DIAGNOSIS — Z8742 Personal history of other diseases of the female genital tract: Secondary | ICD-10-CM | POA: Diagnosis not present

## 2023-12-30 DIAGNOSIS — I129 Hypertensive chronic kidney disease with stage 1 through stage 4 chronic kidney disease, or unspecified chronic kidney disease: Secondary | ICD-10-CM

## 2023-12-30 DIAGNOSIS — R7303 Prediabetes: Secondary | ICD-10-CM

## 2023-12-30 DIAGNOSIS — Z2821 Immunization not carried out because of patient refusal: Secondary | ICD-10-CM

## 2023-12-30 DIAGNOSIS — Z23 Encounter for immunization: Secondary | ICD-10-CM | POA: Diagnosis not present

## 2023-12-30 DIAGNOSIS — I10 Essential (primary) hypertension: Secondary | ICD-10-CM

## 2023-12-30 DIAGNOSIS — N182 Chronic kidney disease, stage 2 (mild): Secondary | ICD-10-CM

## 2023-12-30 DIAGNOSIS — Z5320 Procedure and treatment not carried out because of patient's decision for unspecified reasons: Secondary | ICD-10-CM

## 2023-12-30 DIAGNOSIS — G8929 Other chronic pain: Secondary | ICD-10-CM

## 2023-12-30 DIAGNOSIS — E785 Hyperlipidemia, unspecified: Secondary | ICD-10-CM | POA: Diagnosis not present

## 2023-12-30 DIAGNOSIS — M79674 Pain in right toe(s): Secondary | ICD-10-CM

## 2023-12-30 LAB — GLUCOSE, POCT (MANUAL RESULT ENTRY): POC Glucose: 105 mg/dL — AB (ref 70–99)

## 2023-12-30 LAB — POCT GLYCOSYLATED HEMOGLOBIN (HGB A1C): HbA1c, POC (prediabetic range): 5.7 % (ref 5.7–6.4)

## 2023-12-30 NOTE — Patient Instructions (Addendum)
 Continue healthy eating habits.   I have scheduled you for an appointment in one month with clinical pharmacist.   I would not recommend her to ger surgery on her foot, as she has a low pain tolerance.   I have sent a referral to a gynecologist to get your pap smear completed. This is a test for cervical cancer screening.

## 2023-12-30 NOTE — Progress Notes (Unsigned)
 Patient ID: Brandy Galloway, female    DOB: 1965/05/22  MRN: [id number]  CC: Hypertension (HTN f/u. [facility] will see pt on 01/14/24 for toe pain - Dr.Patel/Yes to flu vax)   Subjective: [name] is a 58 y.o. female who presents for chronic ds management. Her caregiver and guardian, Garen, is with her. Her concerns today include: Pt with hx of HTN, HLD, CKD 2, obesity, prediabetes, abnormal Pap smear with history of LGSIL, developmental delay since birth with microcephaly, seizure (only one time many yrs ago; not on med), anxiety, lumbar stenosis s/p laminectomy (Dr. Lindalee), chronic pain 5th toes.   Uncle has meds with him today, but forgot to bring valsartan .  HTN: BP 147/77 intially; 156/83 on recheck.  Per uncle she took BP meds this morning. Patient continues to take amlodipine  10 mg, HCTZ 25 mg and valsartan  80 mg daily and hydralazine  100 mg 3 times a day. Checks BP at home- but uncle unable to specify what the readings are. He states when the patient gets upset her BP may be high, though she was not upset today. Saw clinical pharmacist in June for BP recheck and reading was normal on that visit. Pt denies SOB, chest pain, headaches, dizziness, or LE swelling.   HLD: Still taking atorvastatin  10 mg daily.   Prediabetes: Lost 15 pounds since 08/2023. BMI now normal range. Sugars today 105. A1c 5.7. 8 months ago it was 5.9. She still has a good appetite and eats salmon, mashed potatoes, green beans, cornbread, chicken legs per uncle. Only eats sugar free cookies; likes sugar free apple juice. Still abstaining from fried foods.   R 5th toe pain: Now reporting more prominent pain at the R toe which her caregiver thinks is nerve pain from the toenail. Her podiatrist Dr. Loreda has set up appt with one of his partners Dr. [facility]ie [facility] on 01/14/2024 for potential surgical eval of R 5th toe. She continues to take takes Tylenol  1-2x a day and pregabalan daily  for toe pain. Also uses kenalog  cream as needed.  HM: She continues to take vitamin D  MM from 11/13/2023: Normal bilateral axillary lymph nodes.   Wants to get Flu shot  Declined shingles vaccine  Pap smears - discussed getting pap smear soon or HPV co testing considering her history of LGSIL. Her uncle reports she is no longer seeing women's health and he is unsure about getting a pap smear.  Pt continues to take OTC miralax  which her uncle gives her only after not using the bathroom for 2-3 days.    Patient Active Problem List   Diagnosis Date Noted   Stage 3a chronic kidney disease (HCC) 04/29/2023   Jaw pain 12/29/2021   AKI (acute kidney injury) (HCC) 12/29/2021   HTN (hypertension) 12/29/2021   Spondylolisthesis of lumbar region 11/22/2020   Abnormal EKG 01/11/2020   Lumbar stenosis with neurogenic claudication 03/31/2019   Degenerative lumbar spinal stenosis 12/09/2018   Lumbar spondylosis 12/09/2018   Lumbar radiculopathy 12/09/2018   Chronic low back pain 10/07/2018   LGSIL on Pap smear of cervix    Postmenopausal vaginal bleeding    Low grade squamous intraepith lesion on cytologic smear cervix (lgsil) 07/12/2011     Current Outpatient Medications on File Prior to Visit  Medication Sig Dispense Refill   acetaminophen  (TYLENOL ) 500 MG tablet Take 1 tablet (500 mg total) by mouth every 6 (six) hours as needed. 60 tablet 1  amLODipine  (NORVASC ) 10 MG tablet Take 1 tablet (10 mg total) by mouth daily. 90 tablet 1   atorvastatin  (LIPITOR) 10 MG tablet Take 1 tablet (10 mg total) by mouth daily. 90 tablet 1   hydrALAZINE  (APRESOLINE ) 100 MG tablet Take 1 tablet (100 mg total) by mouth 3 (three) times daily. 270 tablet 1   hydrochlorothiazide  (HYDRODIURIL ) 25 MG tablet Take 1 tablet (25 mg total) by mouth every morning. 90 tablet 1   Misc. Devices MISC Blood Pressure machine 1 each 0   pregabalin  (LYRICA ) 50 MG capsule Take 1 capsule (50 mg total) by mouth 2 (two) times  daily. 60 capsule 3   triamcinolone  cream (KENALOG ) 0.5 % Apply topically 2 (two) times daily. 30 g 0   valsartan  (DIOVAN ) 80 MG tablet Take 1 tablet (80 mg total) by mouth daily. 90 tablet 3   Vitamin D , Ergocalciferol , (DRISDOL) 1.25 MG (50000 UNIT) CAPS capsule Take 50,000 Units by mouth once a week.     No current facility-administered medications on file prior to visit.    No Known Allergies  Social History   Socioeconomic History   Marital status: Single    Spouse name: Not on file   Number of children: 0   Years of education: Not on file   Highest education level: Not on file  Occupational History   Not on file  Tobacco Use   Smoking status: Never   Smokeless tobacco: Never  Vaping Use   Vaping status: Never Used  Substance and Sexual Activity   Alcohol use: Never   Drug use: Never   Sexual activity: Never    Birth control/protection: None  Other Topics Concern   Not on file  Social History Narrative   08/01/21 Lives with her uncle who is her legal guardian, Garen Louder.         Left Handed       Social Drivers of Health   Financial Resource Strain: Low Risk  (06/28/2022)   Overall Financial Resource Strain (CARDIA)    Difficulty of Paying Living Expenses: Not hard at all  Food Insecurity: No Food Insecurity (06/28/2022)   Hunger Vital Sign    Worried About Running Out of Food in the Last Year: Never true    Ran Out of Food in the Last Year: Never true  Transportation Needs: No Transportation Needs (06/28/2022)   PRAPARE - Administrator, Civil Service (Medical): No    Lack of Transportation (Non-Medical): No  Physical Activity: Inactive (06/28/2022)   Exercise Vital Sign    Days of Exercise per Week: 0 days    Minutes of Exercise per Session: 0 min  Stress: No Stress Concern Present (06/28/2022)   [employer] of Occupational Health - Occupational Stress Questionnaire    Feeling of Stress : Not at all  Social Connections: Socially Isolated  (06/28/2022)   Social Connection and Isolation Panel    Frequency of Communication with Friends and Family: Never    Frequency of Social Gatherings with Friends and Family: More than three times a week    Attends Religious Services: Never    Database administrator or Organizations: Not on file    Attends Banker Meetings: Never    Marital Status: Never married  Intimate Partner Violence: Not At Risk (06/28/2022)   Humiliation, Afraid, Rape, and Kick questionnaire    Fear of Current or Ex-Partner: No    Emotionally Abused: No    Physically Abused: No  Sexually Abused: No    Family History  Problem Relation Age of Onset   Diabetes Mother    Diabetes Father     Past Surgical History:  Procedure Laterality Date   CERVICAL CONE BIOPSY  04/23/2007   CERVICAL CONIZATION W/BX  08/20/2011   Procedure: CONIZATION CERVIX WITH BIOPSY;  Surgeon: Harland JAYSON Birkenhead, MD;  Location: [facility];  Service: Gynecology;  Laterality: N/A;  With Endocervical Currettage   COLPOSCOPY  06/21/2011   COLPOSCOPY N/A 05/28/2016   Procedure: COLPOSCOPY;  Surgeon: Winton Felt, MD;  Location: [facility];  Service: Gynecology;  Laterality: N/A;   DILATION AND CURETTAGE OF UTERUS N/A 05/28/2016   Procedure: DILATATION AND CURETTAGE;  Surgeon: Winton Felt, MD;  Location: [facility];  Service: Gynecology;  Laterality: N/A;   LEEP  04/23/2007   LUMBAR LAMINECTOMY/DECOMPRESSION MICRODISCECTOMY N/A 03/31/2019   Procedure: LAMINECTOMY LUMBAR THREE- LUMBAR FOUR, LUMBAR FOUR- LUMBAR FIVE;  Surgeon: Gillie Duncans, MD;  Location: [facility];  Service: Neurosurgery;  Laterality: N/A;  LAMINECTOMY LUMBAR THREE- LUMBAR FOUR, LUMBAR FOUR- LUMBAR FIVE   MOUTH SURGERY Left 06/2021   Jaw surgery   RADIOLOGY WITH ANESTHESIA N/A 10/17/2020   Procedure: MRI WITH ANESTHESIA LUMBAR SPINE WITH AND WITHOUT CONTRAST;  Surgeon: Radiologist, Medication, MD;  Location: [facility];  Service: Radiology;  Laterality: N/A;   RADIOLOGY WITH ANESTHESIA N/A  11/06/2021   Procedure: MRI WITH ANESTHESIA OF BRAIN WITH AND WITHOUT CONTRAST, MR ANGIOGRAM OF HEAD WITHOUT CONTRAST;  Surgeon: Radiologist, Medication, MD;  Location: [facility];  Service: Radiology;  Laterality: N/A;   TOOTH EXTRACTION N/A 06/29/2021   Procedure: REMOVAL BILATERAL MANDIBULAR LINGUAL TORI;  Surgeon: Sheryle Hamilton, DMD;  Location: [facility];  Service: Oral Surgery;  Laterality: N/A;    ROS: Review of Systems Negative except as stated above  PHYSICAL EXAM: BP (!) 156/83 (BP Location: Right Arm)   Pulse 97   Ht 5' 8 (1.727 m)   Wt 74.4 kg   LMP  (LMP Unknown)   SpO2 98%   BMI 24.94 kg/m   Physical Exam  General appearance - alert, well appearing, AAF and in no distress Mental status - Pt with microcephaly and mental delay unable to provide much history. History is given by her uncle.  Chest - clear to auscultation, no wheezes, rales or rhonchi, symmetric air entry Heart - normal rate, regular rhythm, normal S1, S2, no murmurs, rubs, clicks or gallops Extremities - No LEE, observed pts b/l metatarsals which her uncle reports as the main source of pain, no overt pathology identified. R 5th Toe nail is short and discolored and non-tender to touch.      Latest Ref Rng & Units 09/30/2023    2:10 PM 05/29/2023    2:40 PM 04/29/2023   11:57 AM  CMP  Glucose 70 - 99 mg/dL 91  92  98   BUN 6 - 24 mg/dL 18  17  16    Creatinine 0.57 - 1.00 mg/dL 9.01  8.88  9.02   Sodium 134 - 144 mmol/L 141  142  142   Potassium 3.5 - 5.2 mmol/L 3.7  3.9  4.2   Chloride 96 - 106 mmol/L 99  102  100   CO2 20 - 29 mmol/L 22  25  25    Calcium  8.7 - 10.2 mg/dL 89.9  89.9  89.7   Total Protein 6.0 - 8.5 g/dL  8.0  7.8   Total Bilirubin 0.0 - 1.2 mg/dL  0.7  0.6  Alkaline Phos 44 - 121 IU/L  129  129   AST 0 - 40 IU/L  20  18   ALT 0 - 32 IU/L  27  16    Lipid Panel     Component Value Date/Time   CHOL 242 (H) 04/29/2023 1157   TRIG 96 04/29/2023 1157   HDL 69 04/29/2023 1157   CHOLHDL 3.5  04/29/2023 1157   LDLCALC 156 (H) 04/29/2023 1157    CBC    Component Value Date/Time   WBC 5.0 04/29/2023 1157   WBC 6.7 07/03/2022 1542   RBC 4.37 04/29/2023 1157   RBC 4.74 07/03/2022 1542   HGB 13.0 04/29/2023 1157   HCT 39.5 04/29/2023 1157   PLT 283 04/29/2023 1157   MCV 90 04/29/2023 1157   MCH 29.7 04/29/2023 1157   MCH 29.3 07/03/2022 1542   MCHC 32.9 04/29/2023 1157   MCHC 33.7 07/03/2022 1542   RDW 12.9 04/29/2023 1157   LYMPHSABS 2.2 07/03/2022 1542   MONOABS 0.5 07/03/2022 1542   EOSABS 0.2 07/03/2022 1542   BASOSABS 0.1 07/03/2022 1542    ASSESSMENT AND PLAN:  1. Essential hypertension (Primary) Not at goal. Pt's uncle claims good medication adherence though valsartan  was not brought to the visit today with other meds.   - One month f/u with clinical pharmacist for BP.  - Continue to take amlodipine  10 mg - Continue to take HCTZ 25 mg - Continue to take valsartan  80 mg daily - Continue to take hydralazine  100 mg 3 times a day.  2. Stage 2 chronic kidney disease #1 as above. On 09/30/2023: GFR was 67. Cr 0.98.  3. Chronic toe pain, right foot Discussed that I advise against pursing any surgery on the toe due to her hx of a poor pain threshold. Continue with pain medications as prescribed.  - Continue with appt with [facility] 12/2023 - Continue pregabalin  50 mg daily PRN - Continue Tylenol  PRN  4. Prediabetes A1c 5.7 today. Improved from 5.9 eight months ago. Has lost 15 pounds since 08/2023.  - Discussed and encouraged to continue healthy eating habits.  - POCT glycosylated hemoglobin (Hb A1C) - POCT glucose (manual entry)  5. Hyperlipidemia, unspecified hyperlipidemia type - Continue atorvastatin  10mg  daily  6. Need for immunization against influenza - Flu vaccine given  7. History of abnormal cervical Pap smear Discussed that the patient is overdue for her pap smear. Last pap on 12/2019 was positive for high risk HPV, positive for LGSIL.  Caregiver agrees to referral to gynecology. - Ambulatory referral to gynecology  8. HIV screening declined  9. Hepatitis B vaccination declined   Patient was given the opportunity to ask questions.  Patient verbalized understanding of the plan and was able to repeat key elements of the plan.    Orders Placed This Encounter  Procedures   POCT glycosylated hemoglobin (Hb A1C)   POCT glucose (manual entry)     Requested Prescriptions    No prescriptions requested or ordered in this encounter    Return in about 4 months (around 04/30/2024) for f/u in one month with clinical pharmacist..  This is a Psychologist, occupational Note.  The care of the patient was discussed with Dr. Vicci and the assessment and plan formulated with her assistance.  Please see her attestation of this encounter.  Charlene Duwaine PARAS, Medical Student 12/30/2023, 3:09 PM  Evaluation and management procedures were performed by me with Medical Student in attendance, note written by Medical  Student under my supervision and collaboration. I have reviewed the note and I agree with the management and plan. I have advised against having surgery on her toe due to past hx of low pain threshold when she had previous procedures done. I think caregiver is more obsess with this than the patient is.    Barnie Louder, MD, FAAFP. [facility] [employer], [place] [phone]   12/31/2023, 9:55 AM

## 2023-12-31 ENCOUNTER — Encounter: Payer: Self-pay | Admitting: Internal Medicine

## 2024-01-01 ENCOUNTER — Ambulatory Visit: Admitting: Podiatry

## 2024-01-01 ENCOUNTER — Other Ambulatory Visit: Payer: Self-pay | Admitting: Internal Medicine

## 2024-01-01 DIAGNOSIS — G8929 Other chronic pain: Secondary | ICD-10-CM

## 2024-01-02 NOTE — Telephone Encounter (Signed)
 Requested medications are due for refill today.  yes  Requested medications are on the active medications list.  yes  Last refill. 04/29/2023 #60 3 rf  Future visit scheduled.   yes  Notes to clinic.  Refill not delegated.    Requested Prescriptions  Pending Prescriptions Disp Refills   pregabalin  (LYRICA ) 50 MG capsule [Pharmacy Med Name: PREGABALIN  50MG  CAPSULES] 60 capsule     Sig: TAKE 1 CAPSULE(50 MG) BY MOUTH TWICE DAILY     Not Delegated - Neurology:  Anticonvulsants - Controlled - pregabalin  Failed - 01/02/2024  1:33 PM      Failed - This refill cannot be delegated      Failed - Completed PHQ-2 or PHQ-9 in the last 360 days      Passed - Cr in normal range and within 360 days    Creatinine, Ser  Date Value Ref Range Status  09/30/2023 0.98 0.57 - 1.00 mg/dL Final         Passed - Valid encounter within last 12 months    Recent Outpatient Visits           3 days ago Need for immunization against influenza   [facility] [place]. [facility] Vicci Barnie NOVAK, MD   3 months ago Primary hypertension   [facility] [place] [place]. [facility] Fleeta Morris, Garnette CROME, RPH-CPP   4 months ago Essential hypertension   [facility] [place]. [facility] Vicci Barnie NOVAK, MD   7 months ago Primary hypertension   [facility] [place] [facility] [employer]. [facility] Fleeta Morris Garnette CROME, RPH-CPP   8 months ago Essential hypertension   [facility] [place] [facility] [place]. [facility] Vicci Barnie NOVAK, MD

## 2024-01-14 ENCOUNTER — Ambulatory Visit: Admitting: Podiatry

## 2024-01-14 DIAGNOSIS — M216X2 Other acquired deformities of left foot: Secondary | ICD-10-CM | POA: Diagnosis not present

## 2024-01-14 DIAGNOSIS — M216X1 Other acquired deformities of right foot: Secondary | ICD-10-CM | POA: Diagnosis not present

## 2024-01-14 NOTE — Telephone Encounter (Signed)
 Spoke with [employer]. Referral information given.Placed in [facility]  [address]  [place], [place] [id number] [phone]

## 2024-01-14 NOTE — Progress Notes (Signed)
 Subjective:  Patient ID: Brandy Galloway, female    DOB: [birth year],  MRN: [id number]  Chief Complaint  Patient presents with   Toe Pain    Left foot 5th toe     58 y.o. female presents with the above complaint.  Patient presents with complaint bilateral flatfoot deformity.  She states is painful to touch she wanted get it evaluated she has not seen MRIs prior to seeing me.  Denies any other acute complaints patient like to discuss orthotics options.  She gets some arch and heel pain.  But it is more manageable.   Review of Systems: Negative except as noted in the HPI. Denies N/V/F/Ch.  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/2011   Mental retardation    Microcephaly (HCC)    Seizures (HCC)    last one more than 20 yrs ago   Seizures (HCC)    years ago; more than 20 years ago    Current Outpatient Medications:    acetaminophen  (TYLENOL ) 500 MG tablet, Take 1 tablet (500 mg total) by mouth every 6 (six) hours as needed., Disp: 60 tablet, Rfl: 1   amLODipine  (NORVASC ) 10 MG tablet, Take 1 tablet (10 mg total) by mouth daily., Disp: 90 tablet, Rfl: 1   atorvastatin  (LIPITOR) 10 MG tablet, Take 1 tablet (10 mg total) by mouth daily., Disp: 90 tablet, Rfl: 1   hydrALAZINE  (APRESOLINE ) 100 MG tablet, Take 1 tablet (100 mg total) by mouth 3 (three) times daily., Disp: 270 tablet, Rfl: 1   hydrochlorothiazide  (HYDRODIURIL ) 25 MG tablet, Take 1 tablet (25 mg total) by mouth every morning., Disp: 90 tablet, Rfl: 1   Misc. Devices MISC, Blood Pressure machine, Disp: 1 each, Rfl: 0   pregabalin  (LYRICA ) 50 MG capsule, Take 1 capsule (50 mg total) by mouth 2 (two) times daily., Disp: 60 capsule, Rfl: 1   triamcinolone  cream (KENALOG ) 0.5 %, Apply topically 2 (two) times daily., Disp: 30 g, Rfl: 0   valsartan  (DIOVAN ) 80 MG tablet, Take 1 tablet (80 mg total) by mouth  daily., Disp: 90 tablet, Rfl: 3   Vitamin D , Ergocalciferol , (DRISDOL) 1.25 MG (50000 UNIT) CAPS capsule, Take 50,000 Units by mouth once a week., Disp: , Rfl:   Social History   Tobacco Use  Smoking Status Never  Smokeless Tobacco Never    No Known Allergies Objective:  There were no vitals filed for this visit. There is no height or weight on file to calculate BMI. Constitutional Well developed. Well nourished.  Vascular Dorsalis pedis pulses palpable bilaterally. Posterior tibial pulses palpable bilaterally. Capillary refill normal to all digits.  No cyanosis or clubbing noted. Pedal hair growth normal.  Neurologic Normal speech. Oriented to person, place, and time. Epicritic sensation to light touch grossly present bilaterally.  Dermatologic Nails well groomed and normal in appearance. No open wounds. No skin lesions.  Orthopedic: Gait examination shows pes planovalgus deformity with calcaneovalgus to many toe signs partially loaded for the arch or dorsiflexion of the hallux unable to perform single and double heel raise.   Radiographs: None Assessment:   1. Other acquired deformities of left foot   2. Other acquired deformities of right foot    Plan:  Patient was evaluated and treated and all questions answered.  Pes planovalgus/foot deformity -I explained to patient the etiology of pes planovalgus and relationship  with heel pain/arch pain and various treatment options were discussed.  Given patient foot structure in the setting of heel pain/arch pain I believe patient will benefit from custom-made orthotics to help control the hindfoot motion support the arch of the foot and take the stress away from arches.  Patient agrees with the plan like to proceed with orthotics -Patient was casted for orthotics  -  No follow-ups on file.

## 2024-01-14 NOTE — Telephone Encounter (Unsigned)
 Copied from CRM #[id number]. Topic: General - Other >> Jan 14, 2024  8:37 AM Pinkey ORN wrote: Reason for CRM: Requesting Office Call Back >> Jan 14, 2024  8:39 AM Pinkey ORN wrote: Norleen [phone]  Called on behalf of patient, states he's been waiting for a call back in regards to previous concerns in regards to patient's vaginal area. Please follow up with Mr. Norleen

## 2024-01-15 ENCOUNTER — Telehealth: Payer: Self-pay | Admitting: Podiatry

## 2024-01-15 NOTE — Telephone Encounter (Signed)
 Guardian of patient, Garen Louder, called to inform Dr. Tobie patient is not doing any better after being seen yesterday, 01/14/24. [name] would like for provider to contact him. Contact telephone number, [phone]

## 2024-01-19 ENCOUNTER — Telehealth: Payer: Self-pay | Admitting: Podiatry

## 2024-01-19 NOTE — Telephone Encounter (Signed)
 Patient left voice messages on 01/16/24 at 9:25 AM and on 01/17/24 at 11:36 AM, stating he is still waiting for a response regarding the chart note dated 01/15/24. Thank you.

## 2024-01-19 NOTE — Telephone Encounter (Signed)
 I called Brandy Galloway and he was needing to schedule appointment for surgery consultation. Appointment made for 01/23/24 @ 8:45a

## 2024-01-23 ENCOUNTER — Ambulatory Visit: Admitting: Podiatry

## 2024-01-23 DIAGNOSIS — D492 Neoplasm of unspecified behavior of bone, soft tissue, and skin: Secondary | ICD-10-CM

## 2024-01-23 NOTE — Progress Notes (Signed)
 Subjective:  Patient ID: Brandy Galloway, female    DOB: 11-May-1965,  MRN: [id number]  Chief Complaint  Patient presents with   Toe Pain    58 y.o. female presents with the above complaint.  Patient presents for right 4th and 5th heloma molle.  She states is causing her some discomfort.  She is still waiting for orthotics but she wants to discuss treatment options for the heloma molle pain scale 7 out of 10 dull aching nature hurts with ambulation and shoe pressure she wears tighter shoes.   Review of Systems: Negative except as noted in the HPI. Denies N/V/F/Ch.  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/2011   Mental retardation    Microcephaly (HCC)    Seizures (HCC)    last one more than 20 yrs ago   Seizures (HCC)    years ago; more than 20 years ago    Current Outpatient Medications:    acetaminophen  (TYLENOL ) 500 MG tablet, Take 1 tablet (500 mg total) by mouth every 6 (six) hours as needed., Disp: 60 tablet, Rfl: 1   amLODipine  (NORVASC ) 10 MG tablet, Take 1 tablet (10 mg total) by mouth daily., Disp: 90 tablet, Rfl: 1   atorvastatin  (LIPITOR) 10 MG tablet, Take 1 tablet (10 mg total) by mouth daily., Disp: 90 tablet, Rfl: 1   hydrALAZINE  (APRESOLINE ) 100 MG tablet, Take 1 tablet (100 mg total) by mouth 3 (three) times daily., Disp: 270 tablet, Rfl: 1   hydrochlorothiazide  (HYDRODIURIL ) 25 MG tablet, Take 1 tablet (25 mg total) by mouth every morning., Disp: 90 tablet, Rfl: 1   Misc. Devices MISC, Blood Pressure machine, Disp: 1 each, Rfl: 0   pregabalin  (LYRICA ) 50 MG capsule, Take 1 capsule (50 mg total) by mouth 2 (two) times daily., Disp: 60 capsule, Rfl: 1   triamcinolone  cream (KENALOG ) 0.5 %, Apply topically 2 (two) times daily., Disp: 30 g, Rfl: 0   valsartan  (DIOVAN ) 80 MG tablet, Take 1 tablet (80 mg total) by mouth daily., Disp: 90 tablet,  Rfl: 3   Vitamin D , Ergocalciferol , (DRISDOL) 1.25 MG (50000 UNIT) CAPS capsule, Take 50,000 Units by mouth once a week., Disp: , Rfl:   Social History   Tobacco Use  Smoking Status Never  Smokeless Tobacco Never    No Known Allergies Objective:  There were no vitals filed for this visit. There is no height or weight on file to calculate BMI. Constitutional Well developed. Well nourished.  Vascular Dorsalis pedis pulses palpable bilaterally. Posterior tibial pulses palpable bilaterally. Capillary refill normal to all digits.  No cyanosis or clubbing noted. Pedal hair growth normal.  Neurologic Normal speech. Oriented to person, place, and time. Epicritic sensation to light touch grossly present bilaterally.  Dermatologic  right 4th and 5th digit heloma molle pain on palpation no open wounds or lesion noted.  Pain on palpation to the inside of the fifth digit.  No other concerns  Orthopedic: Normal joint ROM without pain or crepitus bilaterally. No visible deformities. No bony tenderness.   Radiographs: None Assessment:   1. Skin neoplasm    Plan:  Patient was evaluated and treated and all questions answered.  Right 4th and 5th digit heloma molle - All questions and concerns were discussed with the patient extensive due to given the presence of heloma molle patient will benefit from spacer.  Spacer was dispensed no complication noted.  Encouraged shoe gear modification as well.  If any foot and ankle issues are future she will come back and see me.  No follow-ups on file.

## 2024-01-28 ENCOUNTER — Ambulatory Visit: Admitting: Podiatry

## 2024-01-28 DIAGNOSIS — D492 Neoplasm of unspecified behavior of bone, soft tissue, and skin: Secondary | ICD-10-CM | POA: Diagnosis not present

## 2024-01-28 MED ORDER — LIDOCAINE 5 % EX OINT: 1.0000 | TOPICAL_OINTMENT | CUTANEOUS | 0 refills | Status: DC | PRN

## 2024-01-28 NOTE — Progress Notes (Signed)
 Subjective:  Patient ID: Brandy Galloway, female    DOB: 04/01/1966,  MRN: [id number]  Chief Complaint  Patient presents with   Toe Pain    58 y.o. female presents with the above complaint.  Patient presents for right 4th and 5th heloma molle.  She states is causing her some discomfort.  She is still waiting for orthotics but she wants to discuss treatment options for the heloma molle pain scale 7 out of 10 dull aching nature hurts with ambulation and shoe pressure she wears tighter shoes.   Review of Systems: Negative except as noted in the HPI. Denies N/V/F/Ch.  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/2011   Mental retardation    Microcephaly (HCC)    Seizures (HCC)    last one more than 20 yrs ago   Seizures (HCC)    years ago; more than 20 years ago    Current Outpatient Medications:    lidocaine  (XYLOCAINE ) 5 % ointment, Apply 1 Application topically as needed., Disp: 35.44 g, Rfl: 0   acetaminophen  (TYLENOL ) 500 MG tablet, Take 1 tablet (500 mg total) by mouth every 6 (six) hours as needed., Disp: 60 tablet, Rfl: 1   amLODipine  (NORVASC ) 10 MG tablet, Take 1 tablet (10 mg total) by mouth daily., Disp: 90 tablet, Rfl: 1   atorvastatin  (LIPITOR) 10 MG tablet, Take 1 tablet (10 mg total) by mouth daily., Disp: 90 tablet, Rfl: 1   hydrALAZINE  (APRESOLINE ) 100 MG tablet, Take 1 tablet (100 mg total) by mouth 3 (three) times daily., Disp: 270 tablet, Rfl: 1   hydrochlorothiazide  (HYDRODIURIL ) 25 MG tablet, Take 1 tablet (25 mg total) by mouth every morning., Disp: 90 tablet, Rfl: 1   Misc. Devices MISC, Blood Pressure machine, Disp: 1 each, Rfl: 0   pregabalin  (LYRICA ) 50 MG capsule, Take 1 capsule (50 mg total) by mouth 2 (two) times daily., Disp: 60 capsule, Rfl: 1   triamcinolone  cream (KENALOG ) 0.5 %, APPLY TOPICALLY TO THE AFFECTED AREA TWICE DAILY,  Disp: 30 g, Rfl: 0   valsartan  (DIOVAN ) 80 MG tablet, Take 1 tablet (80 mg total) by mouth daily., Disp: 90 tablet, Rfl: 3   Vitamin D , Ergocalciferol , (DRISDOL) 1.25 MG (50000 UNIT) CAPS capsule, Take 50,000 Units by mouth once a week., Disp: , Rfl:   Social History   Tobacco Use  Smoking Status Never  Smokeless Tobacco Never    No Known Allergies Objective:  There were no vitals filed for this visit. There is no height or weight on file to calculate BMI. Constitutional Well developed. Well nourished.  Vascular Dorsalis pedis pulses palpable bilaterally. Posterior tibial pulses palpable bilaterally. Capillary refill normal to all digits.  No cyanosis or clubbing noted. Pedal hair growth normal.  Neurologic Normal speech. Oriented to person, place, and time. Epicritic sensation to light touch grossly present bilaterally.  Dermatologic  right 4th and 5th digit heloma molle pain on palpation no open wounds or lesion noted.  Pain on palpation to the inside of the fifth digit.  No other concerns  Orthopedic: Normal joint ROM without pain or crepitus bilaterally. No visible deformities. No bony tenderness.   Radiographs: None Assessment:   No diagnosis found.  Plan:  Patient was evaluated and treated and all questions answered.  Right 4th and 5th digit heloma molle - All questions and concerns were discussed  with the patient extensive due to given the presence of heloma molle patient will benefit from spacer.  Spacer was dispensed no complication noted.  Encouraged shoe gear modification as well.  If any foot and ankle issues are future she will come back and see me.  No follow-ups on file.

## 2024-01-29 ENCOUNTER — Ambulatory Visit: Admitting: Podiatry

## 2024-02-04 ENCOUNTER — Other Ambulatory Visit: Payer: Self-pay | Admitting: Internal Medicine

## 2024-02-06 ENCOUNTER — Telehealth: Payer: Self-pay

## 2024-02-06 NOTE — Telephone Encounter (Signed)
 Orthotics are here Sport and exercise psychologist form signed and on file Appt needed

## 2024-02-08 NOTE — Progress Notes (Deleted)
 S:     No chief complaint on file.  58 y.o. female who presents for hypertension evaluation, education, and management. PMH is significant for HTN, HLD, CKD 2, obesity, and prediabetes. Patient was referred and last seen by Primary Care Provider, Dr. Vicci, on 12/30/23. At last visit, patients BP was elevated. Patient's uncle states that BP is checked at home, but unable to provide any readings. No medication changes were made at this time.  Today, patient arrives in *** spirits and presents without *** assistance. *** Denies dizziness, headache, blurred vision, swelling.   Patient should have filled medications back in ~August. Last HTN meds picked up was 08/2023 for 90ds Checking BP at home? Readings? Headache? Lightheadedness? Dizziness?  Need an updated BMP - increase valsartan  if BP is elevated Switch to a combo med for compliance - exforge or tribenzor are covered under medicaid  Family/Social history: ***  Medication adherence *** . Patient reports *** taking blood pressure medications today.   Current antihypertensives include: amlodipine  10 mg, HCTZ 25 mg, valsartan  80 mg daily, hydralazine  100 mg 3 times a day  Reported home blood pressure readings: ***  Patient reported dietary habits: Eats *** meals/day Breakfast: *** Lunch: *** Dinner: *** Snacks: *** Drinks: ***  Patient-reported exercise habits: ***  ASCVD risk factors include: ***  O:  ROS  Physical Exam  Last 3 Office BP readings: BP Readings from Last 3 Encounters:  12/30/23 (!) 156/83  09/30/23 123/67  08/29/23 (!) 168/72    BMET    Component Value Date/Time   NA 141 09/30/2023 1410   K 3.7 09/30/2023 1410   CL 99 09/30/2023 1410   CO2 22 09/30/2023 1410   GLUCOSE 91 09/30/2023 1410   GLUCOSE 121 (H) 07/03/2022 1542   BUN 18 09/30/2023 1410   CREATININE 0.98 09/30/2023 1410   CALCIUM  10.0 09/30/2023 1410   GFRNONAA 52 (L) 07/03/2022 1542   GFRAA >60 03/29/2019 1042    Renal  function: CrCl cannot be calculated (Patient's most recent lab result is older than the maximum 21 days allowed.).  Clinical ASCVD: {YES/NO:21197} The 10-year ASCVD risk score (Arnett DK, et al., 2019) is: 11.1%   Values used to calculate the score:     Age: 88 years     Clincally relevant sex: Female     Is Non-Hispanic African American: Yes     Diabetic: No     Tobacco smoker: No     Systolic Blood Pressure: 156 mmHg     Is BP treated: Yes     HDL Cholesterol: 69 mg/dL     Total Cholesterol: 242 mg/dL  Patient is participating in a Managed Medicaid Plan:  {MM YES/NO:27447::Yes}    A/P: Hypertension diagnosed *** currently *** on current medications. BP goal < 130/80 *** mmHg. Medication adherence appears ***. Control is suboptimal due to ***.  -{Meds adjust:18428} ***.  -{Meds adjust:18428} ***.  -Patient educated on purpose, proper use, and potential adverse effects of ***.  -F/u labs ordered - *** -Counseled on lifestyle modifications for blood pressure control including reduced dietary sodium, increased exercise, adequate sleep. -Encouraged patient to check BP at home and bring log of readings to next visit. Counseled on proper use of home BP cuff.   Results reviewed and written information provided.    Written patient instructions provided. Patient verbalized understanding of treatment plan.  Total time in face to face counseling *** minutes.    Follow-up:  Pharmacist ***. PCP clinic visit in ***.  Patient seen with PIERRETTE Jenkins Graces, PharmD PGY1 Pharmacy Resident [phone]

## 2024-02-09 ENCOUNTER — Ambulatory Visit: Payer: Self-pay

## 2024-02-09 ENCOUNTER — Ambulatory Visit: Admitting: Pharmacist

## 2024-02-09 NOTE — Telephone Encounter (Signed)
 FYI Only or Action Required?: FYI only for provider.  Patient was last seen in primary care on 12/30/2023 by Vicci Barnie NOVAK, MD.  Called Nurse Triage reporting Toe Pain.  Symptoms began ongoing issue.  Interventions attempted: Prescription medications: cream from foot and ankle provider, Rest, hydration, or home remedies, and Other: instructions as per foot and ankle doctor.  Symptoms are: gradually worsening.  Triage Disposition: See PCP Within 2 Weeks  Patient/caregiver understands and will follow disposition?: No--Uncle is going to call to get patient to Foot and Ankle doctor after this triage             Copied from CRM [id number]. Topic: Clinical - Red Word Triage >> Feb 09, 2024  8:13 AM Eva FALCON wrote: Red Word that prompted transfer to Nurse Triage: [facility]) called in requesting to cancel appointment. States niece has been complaining of toe pain, states lidocaine  is not working. States they it would take nerve away. Reason for Disposition  [1] MILD pain (e.g., does not interfere with normal activities) AND [2] present > 7 days  Answer Assessment - Initial Assessment Questions Patient has toe pain Given a cream Patient's guardian called to advise she cannot come to her appt today due to her left pinky toe (5th digit) He was advised by the foot and ankle doctor to call them back if  Attempted to call [facility] about cancelling this appointment that was on the schedule for today and rescheduling. No answer at [facility] for [facility] Patient complained of toe pain all weekend Pinson states he has been using the cream given by the foot and ankle doctor but she is still having pain  He states they will be at the [facility] on that upcoming appointment though.  [name] also states that she needs another prescription of a cream she was given from her PCP He denies any trouble breathing, major allergic reaction He states that when she eats certain foods  she will be itching and he states he doesn't give her things like tomatoes all the time but sometimes if they are accidentally on a burger or ketchup---she may have some itching sometimes---he denies hives but states her back will itch. He is advised that if anything gets worse with that to call us  or take her to Urgent Care or the ER He verbalized understanding and just wants some more to help keep her from scratching. He states PCP knows the cream that he is talking about  Patient needs to be called back about rescheduling this appointment.  Patient is advised to call us  back if anything changes or with any further questions/concerns. Patient is advised that if anything worsens to go to the Emergency Room. Patient verbalized understanding.  Protocols used: Toe Pain-A-AH

## 2024-02-11 ENCOUNTER — Ambulatory Visit (INDEPENDENT_AMBULATORY_CARE_PROVIDER_SITE_OTHER): Admitting: Podiatry

## 2024-02-11 DIAGNOSIS — D492 Neoplasm of unspecified behavior of bone, soft tissue, and skin: Secondary | ICD-10-CM | POA: Diagnosis not present

## 2024-02-11 MED ORDER — GABAPENTIN 300 MG PO CAPS: 300.0000 mg | ORAL_CAPSULE | Freq: Three times a day (TID) | ORAL | 3 refills | Status: AC

## 2024-02-11 NOTE — Progress Notes (Signed)
 Subjective:  Patient ID: Brandy Galloway, female    DOB: May 06, 1965,  MRN: [id number]  Chief Complaint  Patient presents with   Toe Pain    58 y.o. female presents with the above complaint.  Patient presents for right 4th and 5th heloma molle.  She states she is having some nerve pain went to get it evaluated this patient is not helping as much.  Denies any other acute issues.   Review of Systems: Negative except as noted in the HPI. Denies N/V/F/Ch.  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/2011   Mental retardation    Microcephaly (HCC)    Seizures (HCC)    last one more than 20 yrs ago   Seizures (HCC)    years ago; more than 20 years ago    Current Outpatient Medications:    gabapentin  (NEURONTIN ) 300 MG capsule, Take 1 capsule (300 mg total) by mouth 3 (three) times daily., Disp: 90 capsule, Rfl: 3   acetaminophen  (TYLENOL ) 500 MG tablet, Take 1 tablet (500 mg total) by mouth every 6 (six) hours as needed., Disp: 60 tablet, Rfl: 1   amLODipine  (NORVASC ) 10 MG tablet, Take 1 tablet (10 mg total) by mouth daily., Disp: 90 tablet, Rfl: 1   atorvastatin  (LIPITOR) 10 MG tablet, Take 1 tablet (10 mg total) by mouth daily., Disp: 90 tablet, Rfl: 1   hydrALAZINE  (APRESOLINE ) 100 MG tablet, Take 1 tablet (100 mg total) by mouth 3 (three) times daily., Disp: 270 tablet, Rfl: 1   hydrochlorothiazide  (HYDRODIURIL ) 25 MG tablet, Take 1 tablet (25 mg total) by mouth every morning., Disp: 90 tablet, Rfl: 1   lidocaine  (XYLOCAINE ) 5 % ointment, Apply 1 Application topically as needed., Disp: 35.44 g, Rfl: 0   Misc. Devices MISC, Blood Pressure machine, Disp: 1 each, Rfl: 0   pregabalin  (LYRICA ) 50 MG capsule, Take 1 capsule (50 mg total) by mouth 2 (two) times daily., Disp: 60 capsule, Rfl: 1   triamcinolone  cream (KENALOG ) 0.5 %, APPLY TOPICALLY TO THE AFFECTED AREA  TWICE DAILY, Disp: 30 g, Rfl: 0   valsartan  (DIOVAN ) 80 MG tablet, Take 1 tablet (80 mg total) by mouth daily., Disp: 90 tablet, Rfl: 3   Vitamin D , Ergocalciferol , (DRISDOL) 1.25 MG (50000 UNIT) CAPS capsule, Take 50,000 Units by mouth once a week., Disp: , Rfl:   Social History   Tobacco Use  Smoking Status Never  Smokeless Tobacco Never    No Known Allergies Objective:  There were no vitals filed for this visit. There is no height or weight on file to calculate BMI. Constitutional Well developed. Well nourished.  Vascular Dorsalis pedis pulses palpable bilaterally. Posterior tibial pulses palpable bilaterally. Capillary refill normal to all digits.  No cyanosis or clubbing noted. Pedal hair growth normal.  Neurologic Normal speech. Oriented to person, place, and time. Epicritic sensation to light touch grossly present bilaterally.  Dermatologic  right 4th and 5th digit heloma molle pain on palpation no open wounds or lesion noted.  Pain on palpation to the inside of the fifth digit.  No other concerns  Orthopedic: Normal joint ROM without pain or crepitus bilaterally. No visible deformities. No bony tenderness.   Radiographs: None Assessment:   1. Skin neoplasm     Plan:  Patient was evaluated and treated and all questions answered.  Right 4th and 5th digit  heloma molle with underlying mild neuritis symptoms - All questions and concerns were discussed with the patient extensive due to given the presence of heloma molle patient will benefit from spacer.  Spacer was dispensed no complication noted.  Encouraged shoe gear modification as well.  If any foot and ankle issues are future she will come back and see me. - Gabapentin  was dispensed for some neuritis symptoms  No follow-ups on file.

## 2024-02-12 NOTE — Telephone Encounter (Signed)
 Called to schedule orthotic fitting/ pu couldn't leave a VM

## 2024-02-20 ENCOUNTER — Telehealth: Payer: Self-pay | Admitting: Podiatry

## 2024-02-20 NOTE — Telephone Encounter (Signed)
 Patient's legal guardian called in regards to the medication that was prescribed to the patient for her left toe. He says that she is still complaining about her toe being in pain, and would like to know what else can be done to alleviate the pain.

## 2024-02-25 ENCOUNTER — Ambulatory Visit: Admitting: Podiatry

## 2024-03-10 ENCOUNTER — Ambulatory Visit: Admitting: Podiatry

## 2024-03-12 ENCOUNTER — Ambulatory Visit: Admitting: Podiatry

## 2024-03-12 DIAGNOSIS — M7752 Other enthesopathy of left foot: Secondary | ICD-10-CM | POA: Diagnosis not present

## 2024-03-12 NOTE — Progress Notes (Unsigned)
 Subjective:  Patient ID: Brandy Galloway, female    DOB: 1965-04-24,  MRN: [id number]  Chief Complaint  Patient presents with   Toe Pain    58 y.o. female presents with the above complaint.  Patient presents for left 4th and 5th heloma molle.  She states that she continues to have pain.  Is right on the fifth digit.  Denies any other acute complaints   Review of Systems: Negative except as noted in the HPI. Denies N/V/F/Ch.  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/2011   Mental retardation    Microcephaly (HCC)    Seizures (HCC)    last one more than 20 yrs ago   Seizures (HCC)    years ago; more than 20 years ago    Current Outpatient Medications:    acetaminophen  (TYLENOL ) 500 MG tablet, Take 1 tablet (500 mg total) by mouth every 6 (six) hours as needed., Disp: 60 tablet, Rfl: 1   amLODipine  (NORVASC ) 10 MG tablet, Take 1 tablet (10 mg total) by mouth daily., Disp: 90 tablet, Rfl: 1   atorvastatin  (LIPITOR) 10 MG tablet, Take 1 tablet (10 mg total) by mouth daily., Disp: 90 tablet, Rfl: 1   gabapentin  (NEURONTIN ) 300 MG capsule, Take 1 capsule (300 mg total) by mouth 3 (three) times daily., Disp: 90 capsule, Rfl: 3   hydrALAZINE  (APRESOLINE ) 100 MG tablet, Take 1 tablet (100 mg total) by mouth 3 (three) times daily., Disp: 270 tablet, Rfl: 1   hydrochlorothiazide  (HYDRODIURIL ) 25 MG tablet, Take 1 tablet (25 mg total) by mouth every morning., Disp: 90 tablet, Rfl: 1   lidocaine  (XYLOCAINE ) 5 % ointment, Apply 1 Application topically as needed. (Patient not taking: Reported on 03/15/2024), Disp: 35.44 g, Rfl: 0   Misc. Devices MISC, Blood Pressure machine (Patient not taking: Reported on 03/15/2024), Disp: 1 each, Rfl: 0   pregabalin  (LYRICA ) 50 MG capsule, Take 1 capsule (50 mg total) by mouth 2 (two) times daily., Disp: 60 capsule, Rfl: 1    triamcinolone  cream (KENALOG ) 0.5 %, APPLY TOPICALLY TO THE AFFECTED AREA TWICE DAILY (Patient not taking: Reported on 03/15/2024), Disp: 30 g, Rfl: 0   valsartan  (DIOVAN ) 80 MG tablet, Take 1 tablet (80 mg total) by mouth daily., Disp: 90 tablet, Rfl: 3   Vitamin D , Ergocalciferol , (DRISDOL) 1.25 MG (50000 UNIT) CAPS capsule, Take 50,000 Units by mouth once a week. (Patient not taking: Reported on 03/15/2024), Disp: , Rfl:   Social History   Tobacco Use  Smoking Status Never  Smokeless Tobacco Never    No Known Allergies Objective:  There were no vitals filed for this visit. There is no height or weight on file to calculate BMI. Constitutional Well developed. Well nourished.  Vascular Dorsalis pedis pulses palpable bilaterally. Posterior tibial pulses palpable bilaterally. Capillary refill normal to all digits.  No cyanosis or clubbing noted. Pedal hair growth normal.  Neurologic Normal speech. Oriented to person, place, and time. Epicritic sensation to light touch grossly present bilaterally.  Dermatologic Left  5th digit heloma molle pain on palpation no open wounds or lesion noted.  Pain on palpation to the inside of the fifth digit.  No other concerns  Orthopedic: Normal joint ROM without pain or crepitus bilaterally. No visible deformities. No bony tenderness.   Radiographs: None Assessment:   1. Capsulitis of toe, left  Plan:  Patient was evaluated and treated and all questions answered.  Left 4th and 5th digit heloma molle with underlying mild neuritis symptoms - All questions and concerns were discussed with the patient extensive due to given given the amount of pain that she is experiencing she would benefit from steroid injection help decrease inflammatory component associated pain.  Patient agrees with plan of to proceed with steroid injection -A steroid injection was performed at right fifth digit PIPJ using 1% plain Lidocaine  and 10 mg of Kenalog . This was  well tolerated.   No follow-ups on file.

## 2024-03-15 ENCOUNTER — Ambulatory Visit (INDEPENDENT_AMBULATORY_CARE_PROVIDER_SITE_OTHER): Admitting: Advanced Practice Midwife

## 2024-03-15 ENCOUNTER — Encounter: Admitting: Advanced Practice Midwife

## 2024-03-15 ENCOUNTER — Encounter: Payer: Self-pay | Admitting: Advanced Practice Midwife

## 2024-03-15 ENCOUNTER — Other Ambulatory Visit (HOSPITAL_COMMUNITY)
Admission: RE | Admit: 2024-03-15 | Discharge: 2024-03-15 | Disposition: A | Source: Ambulatory Visit | Attending: Advanced Practice Midwife | Admitting: Advanced Practice Midwife

## 2024-03-15 VITALS — BP 164/75 | HR 90 | Wt 173.0 lb

## 2024-03-15 DIAGNOSIS — Z01419 Encounter for gynecological examination (general) (routine) without abnormal findings: Secondary | ICD-10-CM | POA: Diagnosis not present

## 2024-03-15 DIAGNOSIS — R87612 Low grade squamous intraepithelial lesion on cytologic smear of cervix (LGSIL): Secondary | ICD-10-CM | POA: Diagnosis not present

## 2024-03-15 DIAGNOSIS — R87610 Atypical squamous cells of undetermined significance on cytologic smear of cervix (ASC-US): Secondary | ICD-10-CM | POA: Insufficient documentation

## 2024-03-15 DIAGNOSIS — I10 Essential (primary) hypertension: Secondary | ICD-10-CM | POA: Diagnosis not present

## 2024-03-15 DIAGNOSIS — R87810 Cervical high risk human papillomavirus (HPV) DNA test positive: Secondary | ICD-10-CM | POA: Insufficient documentation

## 2024-03-15 DIAGNOSIS — Z124 Encounter for screening for malignant neoplasm of cervix: Secondary | ICD-10-CM | POA: Diagnosis present

## 2024-03-15 NOTE — Progress Notes (Signed)
 GYNECOLOGY ANNUAL PREVENTATIVE CARE ENCOUNTER NOTE  History:     Brandy Galloway is a 58 y.o. G0P0000 female here for a routine annual gynecologic exam and F/U after long Hx of abnormal Pap smears, Colposcopies, LEEP, [facility]. Most recent Hx 2021 Pap LSIPL, + HRHPV. Colpo was recommended and declined.  Current complaints: none.     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 She is here with her legal guardian, Brandy Galloway. He states she is not and has never been sexually active and has not had menstrual periods for a while. FSH/LH testing in 2021 indicated that pt was in menopause.    Known Hx hypertension on meds. Managed by PCP. On Amlodipine  10 mg, HCTZ 25 mg and valsartan  80 mg daily and hydralazine  100 mg 3 times a day.   Gynecologic History No LMP recorded (lmp unknown). Patient is postmenopausal. Contraception: abstinence and post menopausal status Last Pap: 2021. Results were: LSIL with positive HPV Last mammogram: 07/2023. Results were: abnormal due to concern about abnormal axillary lymph nodes bilaterally, but US  F/U in 07/2023 was Nml. F/U mammogram recommended in 1 year.   Obstetric History OB History  Gravida Para Term Preterm AB Living  0 0 0 0 0 0  SAB IAB Ectopic Multiple Live Births  0 0 0 0 0    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/2011   Mental retardation    Microcephaly (HCC)    Seizures (HCC)    last one more than 20 yrs ago   Seizures (HCC)    years ago; more than 20 years ago    Past Surgical History:  Procedure Laterality Date   CERVICAL CONE BIOPSY  04/23/2007   CERVICAL CONIZATION W/BX  08/20/2011   Procedure: CONIZATION CERVIX WITH BIOPSY;  Surgeon: Brandy Galloway;  Location: [facility];  Service: Gynecology;  Laterality: N/A;  With Endocervical Currettage   COLPOSCOPY  06/21/2011    COLPOSCOPY N/A 05/28/2016   Procedure: COLPOSCOPY;  Surgeon: Brandy Galloway;  Location: [facility];  Service: Gynecology;  Laterality: N/A;   DILATION AND CURETTAGE OF UTERUS N/A 05/28/2016   Procedure: DILATATION AND CURETTAGE;  Surgeon: Brandy Galloway;  Location: [facility];  Service: Gynecology;  Laterality: N/A;   LEEP  04/23/2007   LUMBAR LAMINECTOMY/DECOMPRESSION MICRODISCECTOMY N/A 03/31/2019   Procedure: LAMINECTOMY LUMBAR THREE- LUMBAR FOUR, LUMBAR FOUR- LUMBAR FIVE;  Surgeon: Brandy Galloway;  Location: [facility];  Service: Neurosurgery;  Laterality: N/A;  LAMINECTOMY LUMBAR THREE- LUMBAR FOUR, LUMBAR FOUR- LUMBAR FIVE   MOUTH SURGERY Left 06/2021   Jaw surgery   RADIOLOGY WITH ANESTHESIA N/A 10/17/2020   Procedure: MRI WITH ANESTHESIA LUMBAR SPINE WITH AND WITHOUT CONTRAST;  Surgeon: Brandy Galloway;  Location: [facility];  Service: Radiology;  Laterality: N/A;   RADIOLOGY WITH ANESTHESIA N/A 11/06/2021   Procedure: MRI WITH ANESTHESIA OF BRAIN WITH AND WITHOUT CONTRAST, MR ANGIOGRAM OF HEAD WITHOUT CONTRAST;  Surgeon: Brandy Galloway;  Location: [facility];  Service: Radiology;  Laterality: N/A;   TOOTH EXTRACTION N/A 06/29/2021   Procedure: REMOVAL BILATERAL MANDIBULAR LINGUAL TORI;  Surgeon: Brandy Galloway;  Location: [facility];  Service: Oral Surgery;  Laterality: N/A;    Current Outpatient Medications on File Prior to Visit  Medication Sig Dispense Refill   acetaminophen  (TYLENOL ) 500  MG tablet Take 1 tablet (500 mg total) by mouth every 6 (six) hours as needed. 60 tablet 1   amLODipine  (NORVASC ) 10 MG tablet Take 1 tablet (10 mg total) by mouth daily. 90 tablet 1   atorvastatin  (LIPITOR) 10 MG tablet Take 1 tablet (10 mg total) by mouth daily. 90 tablet 1   gabapentin  (NEURONTIN ) 300 MG capsule Take 1 capsule (300 mg total) by mouth 3 (three) times daily. 90 capsule 3   hydrALAZINE  (APRESOLINE ) 100 MG tablet Take 1 tablet (100 mg total) by mouth 3 (three) times  daily. 270 tablet 1   hydrochlorothiazide  (HYDRODIURIL ) 25 MG tablet Take 1 tablet (25 mg total) by mouth every morning. 90 tablet 1   pregabalin  (LYRICA ) 50 MG capsule Take 1 capsule (50 mg total) by mouth 2 (two) times daily. 60 capsule 1   valsartan  (DIOVAN ) 80 MG tablet Take 1 tablet (80 mg total) by mouth daily. 90 tablet 3   lidocaine  (XYLOCAINE ) 5 % ointment Apply 1 Application topically as needed. (Patient not taking: Reported on 03/15/2024) 35.44 g 0   Misc. Devices MISC Blood Pressure machine (Patient not taking: Reported on 03/15/2024) 1 each 0   triamcinolone  cream (KENALOG ) 0.5 % APPLY TOPICALLY TO THE AFFECTED AREA TWICE DAILY (Patient not taking: Reported on 03/15/2024) 30 g 0   Vitamin D , Ergocalciferol , (DRISDOL) 1.25 MG (50000 UNIT) CAPS capsule Take 50,000 Units by mouth once a week. (Patient not taking: Reported on 03/15/2024)     No current facility-administered medications on file prior to visit.    No Known Allergies  Social History:  reports that she has never smoked. She has never used smokeless tobacco. She reports that she does not drink alcohol and does not use drugs.  Family History  Problem Relation Age of Onset   Diabetes Mother    Diabetes Father     The following portions of the patient's history were reviewed and updated as appropriate: allergies, current medications, past family history, past medical history, past social history, past surgical history and problem list.  Review of Systems Review of Systems  Unable to perform ROS: Patient nonverbal (Limited by pt being non-verbal. Neg per caregiver.)     Physical Exam:  BP (!) 164/75   Pulse 90   Wt 173 lb (78.5 kg)   LMP  (LMP Unknown)   BMI 26.30 kg/m  CONSTITUTIONAL: Well-developed, well-nourished female in no acute distress.  HENT:  Normocephalic, atraumatic. Oropharynx is clear and moist. Poor dentition. Microcephalic.  EYES: Conjunctivae normal. No scleral icterus.  SKIN: Skin is warm and  dry. No rash noted. Not diaphoretic. No erythema. No pallor. MUSCULOSKELETAL: Normal range of motion. No tenderness.  No cyanosis or edema.   NEUROLOGIC: Alert and oriented to person, place, unsure of time. Normal muscle tone coordination.  PSYCHIATRIC: Normal mood and affect.  CARDIOVASCULAR: Normal heart rate noted. RESPIRATORY: Effort and rate normal. BREASTS: Declined ABDOMEN: Soft, no distention, tenderness, rebound or guarding.  PELVIC: Normal appearing external genitalia; normal appearing vaginal mucosa and cervix.  No abnormal discharge noted.  Pap smear obtained, but cervix incompletely visualized due to pt not able to fully tolerate or understand exam so exam was discontinued. Blind Pap obtained. Pt tolerated well. Guardian and chaperone present for exam. Guardian gave permission to perform and continue exam.   Assessment and Plan:  1. Encounter for screening for cervical cancer (Primary) - Cytology - PAP  2. Low grade squamous intraepith lesion on cytologic smear cervix (lgsil) - Will  likely need to recommend Colpo. Will need to be done under anesthesia since pt does not understand and cannot tolerate detailed exam.   3. ASCUS with positive high risk HPV cervical - Cytology - PAP  4. Well woman exam with routine gynecological exam - [facility] - Needs longer appointment times in the future due to difficulty explaining and performing pelvic exam with pt's developmental disability. Front dest informed.   5. Primary hypertension - Followed by PCP. A little higher today than at recent appointment but likely due to pelvic exam.  Will follow up results of pap smear and manage accordingly. Routine preventative health maintenance measures emphasized. Please refer to After Visit Summary for other counseling recommendations.      Marcene Laskowski  Claudene, CNM [facility] [employer]

## 2024-03-16 ENCOUNTER — Telehealth: Payer: Self-pay | Admitting: Podiatry

## 2024-03-16 NOTE — Telephone Encounter (Signed)
 Uncle (caregiver) reports the patient is still experiencing toe pain L 5th toe. Current pain medications -- gabapentin  (NEURONTIN ) 300 mg capsules and lidocaine  (XYLOCAINE ) 5% ointment -- are not providing  much relief. Please advise.

## 2024-03-19 LAB — CYTOLOGY - PAP
Comment: NEGATIVE
Comment: NEGATIVE
Comment: NEGATIVE
HPV 16: NEGATIVE
HPV 18 / 45: NEGATIVE
High risk HPV: POSITIVE — AB

## 2024-03-22 ENCOUNTER — Encounter: Payer: Self-pay | Admitting: Advanced Practice Midwife

## 2024-03-24 ENCOUNTER — Telehealth: Payer: Self-pay | Admitting: Internal Medicine

## 2024-03-24 ENCOUNTER — Ambulatory Visit: Payer: Self-pay | Admitting: Advanced Practice Midwife

## 2024-03-24 DIAGNOSIS — R87612 Low grade squamous intraepithelial lesion on cytologic smear of cervix (LGSIL): Secondary | ICD-10-CM

## 2024-03-24 DIAGNOSIS — F89 Unspecified disorder of psychological development: Secondary | ICD-10-CM

## 2024-03-24 DIAGNOSIS — R87610 Atypical squamous cells of undetermined significance on cytologic smear of cervix (ASC-US): Secondary | ICD-10-CM

## 2024-03-24 NOTE — Telephone Encounter (Signed)
 I note the results of her pap smear that you did last mth. Still with LGSIL. Has she been scheduled for colposcopy with one of your providers?

## 2024-03-25 NOTE — Telephone Encounter (Signed)
 Okay. Please keep me in the loop.

## 2024-04-01 NOTE — Telephone Encounter (Addendum)
 RN spoke with Brandy Galloway, patient's legal guardian.  He confirmed pt full name and date of birth.  Advised Mr Galloway of persistent abnormal pap smear and provider's recommendation of colposcopy while under anesthesia.  I discussed purpose and basic steps of colpo with potential biopsy.  Mr. Galloway verbalized understanding and is agreeable to [facility] having the colpo with anesthesia.  He requests to be called with scheduling details and states that if he doesn't answer please keep calling.  He had no further questions at this time.    [place], RN

## 2024-04-20 NOTE — Addendum Note (Signed)
 Addended by: CLAUDENE, Karysa Heft  on: 04/20/2024 12:40 AM   Modules accepted: Orders

## 2024-05-03 ENCOUNTER — Ambulatory Visit: Payer: Self-pay | Attending: Internal Medicine | Admitting: Internal Medicine

## 2024-05-03 ENCOUNTER — Encounter: Payer: Self-pay | Admitting: Internal Medicine

## 2024-05-03 VITALS — BP 142/68 | HR 95 | Temp 97.6°F | Ht 68.0 in | Wt 172.0 lb

## 2024-05-03 DIAGNOSIS — Z6826 Body mass index (BMI) 26.0-26.9, adult: Secondary | ICD-10-CM | POA: Diagnosis not present

## 2024-05-03 DIAGNOSIS — I10 Essential (primary) hypertension: Secondary | ICD-10-CM

## 2024-05-03 DIAGNOSIS — E663 Overweight: Secondary | ICD-10-CM

## 2024-05-03 DIAGNOSIS — R7303 Prediabetes: Secondary | ICD-10-CM | POA: Diagnosis not present

## 2024-05-03 DIAGNOSIS — Z8742 Personal history of other diseases of the female genital tract: Secondary | ICD-10-CM

## 2024-05-03 DIAGNOSIS — E785 Hyperlipidemia, unspecified: Secondary | ICD-10-CM | POA: Diagnosis not present

## 2024-05-03 MED ORDER — AMLODIPINE BESYLATE 10 MG PO TABS: 10.0000 mg | ORAL_TABLET | Freq: Every day | ORAL | 1 refills | Status: AC

## 2024-05-03 MED ORDER — VALSARTAN 80 MG PO TABS: 80.0000 mg | ORAL_TABLET | Freq: Every day | ORAL | 3 refills | Status: AC

## 2024-05-03 MED ORDER — ATORVASTATIN CALCIUM 10 MG PO TABS: 10.0000 mg | ORAL_TABLET | Freq: Every day | ORAL | 1 refills | Status: AC

## 2024-05-03 MED ORDER — HYDROCHLOROTHIAZIDE 25 MG PO TABS: 25.0000 mg | ORAL_TABLET | Freq: Every morning | ORAL | 1 refills | Status: AC

## 2024-05-03 MED ORDER — HYDRALAZINE HCL 100 MG PO TABS: 100.0000 mg | ORAL_TABLET | Freq: Three times a day (TID) | ORAL | 1 refills | Status: AC

## 2024-05-03 NOTE — Progress Notes (Signed)
 "   Patient ID: Brandy Galloway, female    DOB: 1965-08-24  MRN: [id number]  CC: Hypertension (HTN f/u. Suellen healthier eating habits/Already received flu vax. )   Subjective: Brandy Galloway is a 59 y.o. female who presents for chronic ds management.  Her caregiver and guardian, Garen, is with her and provides history.  Her chronic medical issues include:  Pt with hx of HTN, HLD, CKD 2, obesity, prediabetes, abnormal Pap smear with history of LGSIL, developmental delay since birth with microcephaly, seizure (only one time many yrs ago; not on med), anxiety, lumbar stenosis s/p laminectomy (Dr. Lindalee), chronic pain 5th toes.   Discussed the use of AI scribe software for clinical note transcription with the patient, who gave verbal consent to proceed.  History of Present Illness Brandy Galloway is a 59 year old female with hypertension who presents for a follow-up visit. [name] forgot to bring meds to visit today for reconciliation.  She is on multiple antihypertensive medications including amlodipine  10 mg daily, hydrochlorothiazide  25 mg daily, valsartan  80 mg daily, and hydralazine  100 mg three times a day. Her caregiver confirms adherence to her medication regimen even though he does not know the names of some of them. Based on last time rxns were written, looks like she should have been out of Hydralazine . Her blood pressure was last recorded at 146/65 mmHg two days ago at home per her uncle. Blood pressure is monitored several times a week, typically in the morning and before bed.   Overwgh/PreDM: Her diet includes scrambled eggs, brown bread, and a little jelly for breakfast, and salmon, cornbread, mashed potatoes, and French string beans for lunch and dinner. She avoids fried foods and prefers baked chicken. She drinks no sugar added apple juice and enjoys applesauce cups. Her weight has increased from 164 lbs in September to 172 lbs currently.  She is also on atorvastatin  10 mg daily  for cholesterol management.   She has a history of an abnormal Pap smear and was referred to a gynecologist. The Pap smear was repeated and returned abnormal again, and a cervical biopsy under anesthesia has been recommended, but not yet scheduled.  She experiences sleep disturbances, often staying up all night watching TV. Her caregiver reports that she enjoys watching shows like 'ERPA', 'Zachary Carol', 'Gladis', and 'Living Single'.    Patient Active Problem List   Diagnosis Date Noted   ASCUS with positive high risk HPV cervical 03/15/2024   Stage 3a chronic kidney disease (HCC) 04/29/2023   Jaw pain 12/29/2021   AKI (acute kidney injury) 12/29/2021   HTN (hypertension) 12/29/2021   Spondylolisthesis of lumbar region 11/22/2020   Abnormal EKG 01/11/2020   Lumbar stenosis with neurogenic claudication 03/31/2019   Degenerative lumbar spinal stenosis 12/09/2018   Lumbar spondylosis 12/09/2018   Lumbar radiculopathy 12/09/2018   Chronic low back pain 10/07/2018   LGSIL on Pap smear of cervix    Low grade squamous intraepith lesion on cytologic smear cervix (lgsil) 07/12/2011     Medications Ordered Prior to Encounter[1]  Allergies[2]  Social History   Socioeconomic History   Marital status: Single    Spouse name: Not on file   Number of children: 0   Years of education: Not on file   Highest education level: Not on file  Occupational History   Not on file  Tobacco Use   Smoking status: Never   Smokeless tobacco: Never  Vaping Use   Vaping status: Never Used  Substance and  Sexual Activity   Alcohol use: Never   Drug use: Never   Sexual activity: Never    Birth control/protection: None  Other Topics Concern   Not on file  Social History Narrative   08/01/21 Lives with her uncle who is her legal guardian, Garen Louder.         Left Handed       Social Drivers of Health   Tobacco Use: Low Risk (05/03/2024)   Patient History    Smoking Tobacco Use: Never     Smokeless Tobacco Use: Never    Passive Exposure: Not on file  Financial Resource Strain: Low Risk (06/28/2022)   Overall Financial Resource Strain (CARDIA)    Difficulty of Paying Living Expenses: Not hard at all  Food Insecurity: No Food Insecurity (06/28/2022)   Hunger Vital Sign    Worried About Running Out of Food in the Last Year: Never true    Ran Out of Food in the Last Year: Never true  Transportation Needs: No Transportation Needs (06/28/2022)   PRAPARE - Administrator, Civil Service (Medical): No    Lack of Transportation (Non-Medical): No  Physical Activity: Inactive (06/28/2022)   Exercise Vital Sign    Days of Exercise per Week: 0 days    Minutes of Exercise per Session: 0 min  Stress: No Stress Concern Present (06/28/2022)   [employer] of Occupational Health - Occupational Stress Questionnaire    Feeling of Stress : Not at all  Social Connections: Socially Isolated (06/28/2022)   Social Connection and Isolation Panel    Frequency of Communication with Friends and Family: Never    Frequency of Social Gatherings with Friends and Family: More than three times a week    Attends Religious Services: Never    Database Administrator or Organizations: Not on file    Attends Banker Meetings: Never    Marital Status: Never married  Intimate Partner Violence: Not At Risk (06/28/2022)   Humiliation, Afraid, Rape, and Kick questionnaire    Fear of Current or Ex-Partner: No    Emotionally Abused: No    Physically Abused: No    Sexually Abused: No  Depression (PHQ2-9): Medium Risk (06/28/2022)   Depression (PHQ2-9)    PHQ-2 Score: 10  Alcohol Screen: Low Risk (06/28/2022)   Alcohol Screen    Last Alcohol Screening Score (AUDIT): 0  Housing: Low Risk (06/28/2022)   Housing    Last Housing Risk Score: 0  Utilities: Not At Risk (06/28/2022)   AHC Utilities    Threatened with loss of utilities: No  Health Literacy: Not on file    Family History  Problem  Relation Age of Onset   Diabetes Mother    Diabetes Father     Past Surgical History:  Procedure Laterality Date   CERVICAL CONE BIOPSY  04/23/2007   CERVICAL CONIZATION W/BX  08/20/2011   Procedure: CONIZATION CERVIX WITH BIOPSY;  Surgeon: Harland JAYSON Birkenhead, MD;  Location: [facility];  Service: Gynecology;  Laterality: N/A;  With Endocervical Currettage   COLPOSCOPY  06/21/2011   COLPOSCOPY N/A 05/28/2016   Procedure: COLPOSCOPY;  Surgeon: Winton Felt, MD;  Location: [facility];  Service: Gynecology;  Laterality: N/A;   DILATION AND CURETTAGE OF UTERUS N/A 05/28/2016   Procedure: DILATATION AND CURETTAGE;  Surgeon: Winton Felt, MD;  Location: [facility];  Service: Gynecology;  Laterality: N/A;   LEEP  04/23/2007   LUMBAR LAMINECTOMY/DECOMPRESSION MICRODISCECTOMY N/A 03/31/2019   Procedure: LAMINECTOMY LUMBAR  THREE- LUMBAR FOUR, LUMBAR FOUR- LUMBAR FIVE;  Surgeon: Gillie Duncans, MD;  Location: [facility];  Service: Neurosurgery;  Laterality: N/A;  LAMINECTOMY LUMBAR THREE- LUMBAR FOUR, LUMBAR FOUR- LUMBAR FIVE   MOUTH SURGERY Left 06/2021   Jaw surgery   RADIOLOGY WITH ANESTHESIA N/A 10/17/2020   Procedure: MRI WITH ANESTHESIA LUMBAR SPINE WITH AND WITHOUT CONTRAST;  Surgeon: Radiologist, Medication, MD;  Location: [facility];  Service: Radiology;  Laterality: N/A;   RADIOLOGY WITH ANESTHESIA N/A 11/06/2021   Procedure: MRI WITH ANESTHESIA OF BRAIN WITH AND WITHOUT CONTRAST, MR ANGIOGRAM OF HEAD WITHOUT CONTRAST;  Surgeon: Radiologist, Medication, MD;  Location: [facility];  Service: Radiology;  Laterality: N/A;   TOOTH EXTRACTION N/A 06/29/2021   Procedure: REMOVAL BILATERAL MANDIBULAR LINGUAL TORI;  Surgeon: Sheryle Hamilton, DMD;  Location: [facility];  Service: Oral Surgery;  Laterality: N/A;    ROS: Review of Systems Negative except as stated above  PHYSICAL EXAM: BP (!) 142/68   Pulse 95   Temp 97.6 F (36.4 C) (Oral)   Ht 5' 8 (1.727 m)   Wt 172 lb (78 kg)   LMP  (LMP Unknown)   SpO2 99%   BMI 26.15  kg/m   Wt Readings from Last 3 Encounters:  05/03/24 172 lb (78 kg)  03/15/24 173 lb (78.5 kg)  12/30/23 164 lb (74.4 kg)    Physical Exam  General appearance - older AAF, and in no distress. Clothing clean Mental status - pt appears sleepy. Uncle reports she was up watching TV all night  Neck - supple, no significant adenopathy Chest - clear to auscultation, no wheezes, rales or rhonchi, symmetric air entry Heart - normal rate, regular rhythm, normal S1, S2, no murmurs, rubs, clicks or gallops Extremities - peripheral pulses normal, no pedal edema, no clubbing or cyanosis      Latest Ref Rng & Units 09/30/2023    2:10 PM 05/29/2023    2:40 PM 04/29/2023   11:57 AM  CMP  Glucose 70 - 99 mg/dL 91  92  98   BUN 6 - 24 mg/dL 18  17  16    Creatinine 0.57 - 1.00 mg/dL 9.01  8.88  9.02   Sodium 134 - 144 mmol/L 141  142  142   Potassium 3.5 - 5.2 mmol/L 3.7  3.9  4.2   Chloride 96 - 106 mmol/L 99  102  100   CO2 20 - 29 mmol/L 22  25  25    Calcium  8.7 - 10.2 mg/dL 89.9  89.9  89.7   Total Protein 6.0 - 8.5 g/dL  8.0  7.8   Total Bilirubin 0.0 - 1.2 mg/dL  0.7  0.6   Alkaline Phos 44 - 121 IU/L  129  129   AST 0 - 40 IU/L  20  18   ALT 0 - 32 IU/L  27  16    Lipid Panel     Component Value Date/Time   CHOL 242 (H) 04/29/2023 1157   TRIG 96 04/29/2023 1157   HDL 69 04/29/2023 1157   CHOLHDL 3.5 04/29/2023 1157   LDLCALC 156 (H) 04/29/2023 1157    CBC    Component Value Date/Time   WBC 5.0 04/29/2023 1157   WBC 6.7 07/03/2022 1542   RBC 4.37 04/29/2023 1157   RBC 4.74 07/03/2022 1542   HGB 13.0 04/29/2023 1157   HCT 39.5 04/29/2023 1157   PLT 283 04/29/2023 1157   MCV 90 04/29/2023 1157   MCH 29.7 04/29/2023  1157   MCH 29.3 07/03/2022 1542   MCHC 32.9 04/29/2023 1157   MCHC 33.7 07/03/2022 1542   RDW 12.9 04/29/2023 1157   LYMPHSABS 2.2 07/03/2022 1542   MONOABS 0.5 07/03/2022 1542   EOSABS 0.2 07/03/2022 1542   BASOSABS 0.1 07/03/2022 1542    ASSESSMENT AND  PLAN: 1. Essential hypertension (Primary) Repeat BP closer to goal Continue current medications listed above.  Refill sent on amlodipine , hydrochlorothiazide  and hydralazine .  Continue valsartan .  Caregiver advised to bring all medicines to subsequent visits. - amLODipine  (NORVASC ) 10 MG tablet; Take 1 tablet (10 mg total) by mouth daily.  Dispense: 90 tablet; Refill: 1 - hydrochlorothiazide  (HYDRODIURIL ) 25 MG tablet; Take 1 tablet (25 mg total) by mouth every morning.  Dispense: 90 tablet; Refill: 1 - CBC - Comprehensive metabolic panel with GFR - hydrALAZINE  (APRESOLINE ) 100 MG tablet; Take 1 tablet (100 mg total) by mouth 3 (three) times daily.  Dispense: 270 tablet; Refill: 1  2. Hyperlipidemia, unspecified hyperlipidemia type - atorvastatin  (LIPITOR) 10 MG tablet; Take 1 tablet (10 mg total) by mouth daily.  Dispense: 90 tablet; Refill: 1 - Lipid panel  3. History of abnormal cervical Pap smear Message sent to RN Elby Birmingham inquiring if pt will be scheduled for colpo  4. Overweight (BMI 25.0-29.9) 5. Prediabetes Commended her uncle on encouraging her to eat healthier    Patient was given the opportunity to ask questions.  Patient verbalized understanding of the plan and was able to repeat key elements of the plan.   This documentation was completed using Paediatric nurse.  Any transcriptional errors are unintentional.  Orders Placed This Encounter  Procedures   CBC   Lipid panel   Comprehensive metabolic panel with GFR     Requested Prescriptions   Signed Prescriptions Disp Refills   amLODipine  (NORVASC ) 10 MG tablet 90 tablet 1    Sig: Take 1 tablet (10 mg total) by mouth daily.   valsartan  (DIOVAN ) 80 MG tablet 90 tablet 3    Sig: Take 1 tablet (80 mg total) by mouth daily.   atorvastatin  (LIPITOR) 10 MG tablet 90 tablet 1    Sig: Take 1 tablet (10 mg total) by mouth daily.   hydrochlorothiazide  (HYDRODIURIL ) 25 MG tablet 90 tablet 1    Sig:  Take 1 tablet (25 mg total) by mouth every morning.   hydrALAZINE  (APRESOLINE ) 100 MG tablet 270 tablet 1    Sig: Take 1 tablet (100 mg total) by mouth 3 (three) times daily.    Return in about 4 months (around 08/31/2024).  Barnie Louder, MD, FACP     [1]  Current Outpatient Medications on File Prior to Visit  Medication Sig Dispense Refill   acetaminophen  (TYLENOL ) 500 MG tablet Take 1 tablet (500 mg total) by mouth every 6 (six) hours as needed. 60 tablet 1   gabapentin  (NEURONTIN ) 300 MG capsule Take 1 capsule (300 mg total) by mouth 3 (three) times daily. 90 capsule 3   pregabalin  (LYRICA ) 50 MG capsule Take 1 capsule (50 mg total) by mouth 2 (two) times daily. 60 capsule 1   lidocaine  (XYLOCAINE ) 5 % ointment Apply 1 Application topically as needed. (Patient not taking: Reported on 05/03/2024) 35.44 g 0   triamcinolone  cream (KENALOG ) 0.5 % APPLY TOPICALLY TO THE AFFECTED AREA TWICE DAILY (Patient not taking: Reported on 05/03/2024) 30 g 0   Vitamin D , Ergocalciferol , (DRISDOL) 1.25 MG (50000 UNIT) CAPS capsule Take 50,000 Units by mouth once a week. (  Patient not taking: Reported on 05/03/2024)     No current facility-administered medications on file prior to visit.  [2] No Known Allergies  "

## 2024-05-03 NOTE — Patient Instructions (Signed)
" °  VISIT SUMMARY: During your follow-up visit, we reviewed your current medications for hypertension and cholesterol management, discussed your recent abnormal Pap smear results, and addressed your sleep disturbances. We also talked about your diet and weight changes.  YOUR PLAN: -ESSENTIAL HYPERTENSION: Essential hypertension means high blood pressure without a known secondary cause. Your blood pressure is slightly elevated. Continue taking your current medications: amlodipine  10 mg daily, hydrochlorothiazide  25 mg daily, valsartan  80 mg daily, and hydralazine  100 mg three times daily. Please bring your blood pressure log to your next appointment. Additionally, try to limit your salt intake in your diet.  -HYPERLIPIDEMIA: Hyperlipidemia means having high levels of fats (lipids) in your blood, such as cholesterol. Continue taking atorvastatin  10 mg daily. We have ordered a cholesterol blood test for you today.  -HISTORY OF ABNORMAL CERVICAL PAP SMEAR: An abnormal Pap smear indicates that there are changes in the cells of your cervix that could potentially lead to cancer. We are awaiting the scheduling of your cervical biopsy under anesthesia. We have sent a message to your gynecologist to expedite this process.  INSTRUCTIONS: Please bring your blood pressure log to your next appointment. Continue with your current medications and dietary modifications. Complete the cholesterol blood test today. We will follow up with your gynecologist to ensure your cervical biopsy is scheduled promptly.                      Contains text generated by Abridge.                                 Contains text generated by Abridge.   "

## 2024-05-04 LAB — LIPID PANEL

## 2024-05-08 ENCOUNTER — Ambulatory Visit: Payer: Self-pay | Admitting: Internal Medicine

## 2024-05-10 ENCOUNTER — Telehealth: Payer: Self-pay | Admitting: Family Medicine

## 2024-05-10 LAB — CBC
Hematocrit: 40.1 % (ref 34.0–46.6)
Hemoglobin: 13.1 g/dL (ref 11.1–15.9)
MCH: 30 pg (ref 26.6–33.0)
MCHC: 32.7 g/dL (ref 31.5–35.7)
MCV: 92 fL (ref 79–97)
Platelets: 303 x10E3/uL (ref 150–450)
RBC: 4.36 x10E6/uL (ref 3.77–5.28)
RDW: 12.8 % (ref 11.7–15.4)
WBC: 5.7 x10E3/uL (ref 3.4–10.8)

## 2024-05-10 LAB — COMPREHENSIVE METABOLIC PANEL WITH GFR
ALT: 12 IU/L (ref 0–32)
AST: 16 IU/L (ref 0–40)
Albumin: 5.1 g/dL — ABNORMAL HIGH (ref 3.8–4.9)
Alkaline Phosphatase: 110 IU/L (ref 49–135)
BUN/Creatinine Ratio: 19 (ref 9–23)
BUN: 20 mg/dL (ref 6–24)
CO2: 18 mmol/L — ABNORMAL LOW (ref 20–29)
Calcium: 10.5 mg/dL — ABNORMAL HIGH (ref 8.7–10.2)
Creatinine, Ser: 1.03 mg/dL — ABNORMAL HIGH (ref 0.57–1.00)
Globulin, Total: 3.4 g/dL (ref 1.5–4.5)
Glucose: 109 mg/dL — ABNORMAL HIGH (ref 70–99)
Total Protein: 8.5 g/dL (ref 6.0–8.5)
eGFR: 63 mL/min/1.73

## 2024-05-10 LAB — LIPID PANEL
Chol/HDL Ratio: 2.5 ratio (ref 0.0–4.4)
Cholesterol, Total: 213 mg/dL — ABNORMAL HIGH (ref 100–199)
HDL: 84 mg/dL
LDL Chol Calc (NIH): 106 mg/dL — ABNORMAL HIGH (ref 0–99)
Triglycerides: 136 mg/dL (ref 0–149)
VLDL Cholesterol Cal: 23 mg/dL (ref 5–40)

## 2024-05-10 NOTE — Telephone Encounter (Signed)
 Called patient to reschedule colpo procedure. Patient phone was not connecting the call so I was not able to speak with her or her emergency contact to discuss rescheduling the appointment. Sent a Mychart message to the patient.

## 2024-05-11 ENCOUNTER — Telehealth: Payer: Self-pay

## 2024-05-11 NOTE — Telephone Encounter (Signed)
 I called patient's uncle to see if patient is available for surgery w/ Dr. Cleatus on 05/25/24 at 3:45 pm. Phone rang over 10x's, no voicemail available.

## 2024-05-13 ENCOUNTER — Other Ambulatory Visit: Payer: Self-pay | Admitting: Podiatry

## 2024-05-18 ENCOUNTER — Telehealth: Payer: Self-pay | Admitting: Family Medicine

## 2024-05-18 ENCOUNTER — Telehealth: Payer: Self-pay

## 2024-05-18 NOTE — Telephone Encounter (Signed)
 Received a call from pt Uncle wanting to know the date of her upcoming appt. After reviewing pts chart I let her uncle know that she is scheduled for surgery in the hospital instead of having the procedure done in the office. I let him know the appt details including location, date and time and I also gave him the phone number to call if he has questions or needs to reschedule.

## 2024-05-18 NOTE — Telephone Encounter (Signed)
 Mr. Garen called to ask if the patient surgery could be moved to 06/01/24. He states he pays all his bills on the 3rd of the month. OR approved the time for Dr. Cleatus on 2/10 at 4 pm. I will moved patient's surgery date.

## 2024-05-25 DIAGNOSIS — N1831 Chronic kidney disease, stage 3a: Secondary | ICD-10-CM

## 2024-05-25 DIAGNOSIS — R87612 Low grade squamous intraepithelial lesion on cytologic smear of cervix (LGSIL): Secondary | ICD-10-CM

## 2024-05-25 DIAGNOSIS — N179 Acute kidney failure, unspecified: Secondary | ICD-10-CM

## 2024-05-25 DIAGNOSIS — I10 Essential (primary) hypertension: Secondary | ICD-10-CM

## 2024-05-26 ENCOUNTER — Ambulatory Visit: Admitting: Podiatry

## 2024-05-28 ENCOUNTER — Ambulatory Visit: Admitting: Podiatry

## 2024-05-28 DIAGNOSIS — D492 Neoplasm of unspecified behavior of bone, soft tissue, and skin: Secondary | ICD-10-CM

## 2024-05-28 MED ORDER — GABAPENTIN 300 MG PO CAPS: 300.0000 mg | ORAL_CAPSULE | Freq: Three times a day (TID) | ORAL | 3 refills | Status: AC

## 2024-05-28 NOTE — Progress Notes (Signed)
 "  Subjective:  Patient ID: Brandy Galloway, female    DOB: 1966-03-18,  MRN: [id number]  Chief Complaint  Patient presents with   Nail Problem    Left foot 5th nail causing pain     59 y.o. female presents with the above complaint.  Patient presents for right 4th and 5th heloma molle.  She states she is having some nerve pain went to get it evaluated this patient is not helping as much.  Denies any other acute issues.   Review of Systems: Negative except as noted in the HPI. Denies N/V/F/Ch.  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/2011   Mental retardation    Microcephaly (HCC)    Seizures (HCC)    last one more than 20 yrs ago   Seizures (HCC)    years ago; more than 20 years ago    Current Outpatient Medications:    acetaminophen  (TYLENOL ) 500 MG tablet, Take 1 tablet (500 mg total) by mouth every 6 (six) hours as needed., Disp: 60 tablet, Rfl: 1   amLODipine  (NORVASC ) 10 MG tablet, Take 1 tablet (10 mg total) by mouth daily., Disp: 90 tablet, Rfl: 1   atorvastatin  (LIPITOR) 10 MG tablet, Take 1 tablet (10 mg total) by mouth daily., Disp: 90 tablet, Rfl: 1   gabapentin  (NEURONTIN ) 300 MG capsule, Take 1 capsule (300 mg total) by mouth 3 (three) times daily., Disp: 90 capsule, Rfl: 3   hydrALAZINE  (APRESOLINE ) 100 MG tablet, Take 1 tablet (100 mg total) by mouth 3 (three) times daily., Disp: 270 tablet, Rfl: 1   hydrochlorothiazide  (HYDRODIURIL ) 25 MG tablet, Take 1 tablet (25 mg total) by mouth every morning., Disp: 90 tablet, Rfl: 1   lidocaine  (XYLOCAINE ) 5 % ointment, APPLY TO THE AFFECTED AREA EVERY DAY AS NEEDED, Disp: 35.44 g, Rfl: 0   pregabalin  (LYRICA ) 50 MG capsule, Take 1 capsule (50 mg total) by mouth 2 (two) times daily., Disp: 60 capsule, Rfl: 1   triamcinolone  cream (KENALOG ) 0.5 %, APPLY TOPICALLY TO THE AFFECTED AREA TWICE DAILY (Patient  not taking: Reported on 05/03/2024), Disp: 30 g, Rfl: 0   valsartan  (DIOVAN ) 80 MG tablet, Take 1 tablet (80 mg total) by mouth daily., Disp: 90 tablet, Rfl: 3   Vitamin D , Ergocalciferol , (DRISDOL) 1.25 MG (50000 UNIT) CAPS capsule, Take 50,000 Units by mouth once a week. (Patient not taking: Reported on 05/03/2024), Disp: , Rfl:   Social History   Tobacco Use  Smoking Status Never  Smokeless Tobacco Never    No Known Allergies Objective:  There were no vitals filed for this visit. There is no height or weight on file to calculate BMI. Constitutional Well developed. Well nourished.  Vascular Dorsalis pedis pulses palpable bilaterally. Posterior tibial pulses palpable bilaterally. Capillary refill normal to all digits.  No cyanosis or clubbing noted. Pedal hair growth normal.  Neurologic Normal speech. Oriented to person, place, and time. Epicritic sensation to light touch grossly present bilaterally.  Dermatologic  right 4th and 5th digit heloma molle pain on palpation no open wounds or lesion noted.  Pain on palpation to the inside of the fifth digit.  No other concerns  Orthopedic: Normal joint ROM without pain or crepitus bilaterally. No visible deformities. No bony tenderness.   Radiographs: None Assessment:   No diagnosis found.   Plan:  Patient was evaluated  and treated and all questions answered.  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  Spacer was dispensed no complication noted.  Encouraged shoe gear modification as well.  If any foot and ankle issues are future she will come back and see me. - Gabapentin  was refilled.  Continue using gabapentin  for pain  No follow-ups on file. "

## 2024-06-01 ENCOUNTER — Ambulatory Visit (HOSPITAL_COMMUNITY): Admission: RE | Admit: 2024-06-01 | Source: Home / Self Care | Admitting: Obstetrics and Gynecology

## 2024-06-01 ENCOUNTER — Encounter (HOSPITAL_COMMUNITY): Admission: RE | Payer: Self-pay | Source: Home / Self Care

## 2024-06-01 DIAGNOSIS — R87612 Low grade squamous intraepithelial lesion on cytologic smear of cervix (LGSIL): Secondary | ICD-10-CM

## 2024-06-01 DIAGNOSIS — N1831 Chronic kidney disease, stage 3a: Secondary | ICD-10-CM

## 2024-06-01 DIAGNOSIS — N179 Acute kidney failure, unspecified: Secondary | ICD-10-CM

## 2024-06-01 DIAGNOSIS — I10 Essential (primary) hypertension: Secondary | ICD-10-CM

## 2024-06-15 ENCOUNTER — Ambulatory Visit: Payer: Self-pay | Admitting: Obstetrics and Gynecology

## 2024-09-02 ENCOUNTER — Ambulatory Visit: Payer: Self-pay | Admitting: Internal Medicine
